# Patient Record
Sex: Female | Born: 1970 | Race: Black or African American | Hispanic: No | Marital: Single | Smoking: Current every day smoker
Health system: Southern US, Community
[De-identification: ages and names within clinical notes are randomized; demographics above are authoritative.]

## PROBLEM LIST (undated history)

## (undated) ENCOUNTER — Emergency Department (HOSPITAL_COMMUNITY): Discharge status: Against Medical Advice | Payer: Medicaid Other

## (undated) DIAGNOSIS — R011 Cardiac murmur, unspecified: Secondary | ICD-10-CM

## (undated) DIAGNOSIS — J189 Pneumonia, unspecified organism: Secondary | ICD-10-CM

## (undated) DIAGNOSIS — R519 Headache, unspecified: Secondary | ICD-10-CM

## (undated) DIAGNOSIS — R51 Headache: Secondary | ICD-10-CM

## (undated) DIAGNOSIS — K56600 Partial intestinal obstruction, unspecified as to cause: Secondary | ICD-10-CM

## (undated) DIAGNOSIS — F319 Bipolar disorder, unspecified: Secondary | ICD-10-CM

## (undated) DIAGNOSIS — N289 Disorder of kidney and ureter, unspecified: Secondary | ICD-10-CM

## (undated) DIAGNOSIS — L02211 Cutaneous abscess of abdominal wall: Secondary | ICD-10-CM

## (undated) DIAGNOSIS — J449 Chronic obstructive pulmonary disease, unspecified: Secondary | ICD-10-CM

## (undated) DIAGNOSIS — D649 Anemia, unspecified: Secondary | ICD-10-CM

## (undated) DIAGNOSIS — J45909 Unspecified asthma, uncomplicated: Secondary | ICD-10-CM

## (undated) DIAGNOSIS — K255 Chronic or unspecified gastric ulcer with perforation: Secondary | ICD-10-CM

## (undated) HISTORY — PX: HERNIA REPAIR: SHX51

## (undated) HISTORY — PX: OTHER SURGICAL HISTORY: SHX169

## (undated) HISTORY — PX: TUBAL LIGATION: SHX77

## (undated) HISTORY — PX: APPENDECTOMY: SHX54

---

## 2012-05-21 ENCOUNTER — Encounter (HOSPITAL_COMMUNITY): Payer: Self-pay | Admitting: Emergency Medicine

## 2012-05-21 ENCOUNTER — Emergency Department (HOSPITAL_COMMUNITY): Payer: Medicaid Other

## 2012-05-21 ENCOUNTER — Emergency Department (HOSPITAL_COMMUNITY)
Admission: EM | Admit: 2012-05-21 | Discharge: 2012-05-21 | Disposition: A | Discharge status: Home / Self Care | Payer: Medicaid Other | Attending: Emergency Medicine | Admitting: Emergency Medicine

## 2012-05-21 DIAGNOSIS — J45901 Unspecified asthma with (acute) exacerbation: Secondary | ICD-10-CM | POA: Insufficient documentation

## 2012-05-21 DIAGNOSIS — J3489 Other specified disorders of nose and nasal sinuses: Secondary | ICD-10-CM | POA: Insufficient documentation

## 2012-05-21 DIAGNOSIS — R0982 Postnasal drip: Secondary | ICD-10-CM | POA: Insufficient documentation

## 2012-05-21 DIAGNOSIS — R062 Wheezing: Secondary | ICD-10-CM

## 2012-05-21 DIAGNOSIS — R05 Cough: Secondary | ICD-10-CM

## 2012-05-21 DIAGNOSIS — F411 Generalized anxiety disorder: Secondary | ICD-10-CM | POA: Insufficient documentation

## 2012-05-21 MED ORDER — LORAZEPAM 1 MG PO TABS
ORAL_TABLET | ORAL | Status: AC
  Administered 2012-05-21: 1 mg
  Filled 2012-05-21: qty 1

## 2012-05-21 MED ORDER — ALBUTEROL SULFATE (5 MG/ML) 0.5% IN NEBU: 5.0000 mg | INHALATION_SOLUTION | Freq: Once | RESPIRATORY_TRACT | Status: AC

## 2012-05-21 MED ORDER — LORAZEPAM 2 MG/ML IJ SOLN: 1.0000 mg | Freq: Once | INTRAMUSCULAR | Status: DC

## 2012-05-21 MED ORDER — ALBUTEROL SULFATE HFA 108 (90 BASE) MCG/ACT IN AERS: 2.0000 | INHALATION_SPRAY | RESPIRATORY_TRACT | Status: DC | PRN

## 2012-05-21 MED ORDER — ALBUTEROL SULFATE (5 MG/ML) 0.5% IN NEBU
5.0000 mg | INHALATION_SOLUTION | Freq: Once | RESPIRATORY_TRACT | Status: AC
  Administered 2012-05-21: 5 mg via RESPIRATORY_TRACT
  Filled 2012-05-21: qty 1

## 2012-05-21 NOTE — ED Provider Notes (Addendum)
History     CSN: [id number]  Arrival date & time 05/21/12  4098   First MD Initiated Contact with Patient 05/21/12 [phone]      Chief Complaint  Patient presents with  . Asthma  . coughing   . Recurrent Sinusitis    (Consider location/radiation/quality/duration/timing/severity/associated sxs/prior treatment) Patient is a 41 y.o. female presenting with asthma. The history is provided by the patient.  Asthma Pertinent negatives include no chest pain, no abdominal pain, no headaches and no shortness of breath.  pt with ?hx asthma, recurrent coughing spells, states out of inhaler and has had non productive cough in past 1-2 days. States feels congested in nose, post nasal drip. No sore throat. No headache or body aches. No known ill contacts. No fever or chills. No night sweats. w asthma, denies prior admission for same. Hx smoking. No chest pain. No swelling. No orthopnea or pnd.     History reviewed. No pertinent past medical history.  History reviewed. No pertinent past surgical history.  History reviewed. No pertinent family history.  History  Substance Use Topics  . Smoking status: Not on file  . Smokeless tobacco: Not on file  . Alcohol Use: Not on file    OB History    Grav Para Term Preterm Abortions TAB SAB Ect Mult Living                  Review of Systems  Constitutional: Negative for fever and chills.  HENT: Negative for neck pain.   Eyes: Negative for redness.  Respiratory: Positive for cough. Negative for shortness of breath.   Cardiovascular: Negative for chest pain, palpitations and leg swelling.  Gastrointestinal: Negative for abdominal pain.  Genitourinary: Negative for flank pain.  Musculoskeletal: Negative for back pain.  Skin: Negative for rash.  Neurological: Negative for headaches.  Hematological: Does not bruise/bleed easily.  Psychiatric/Behavioral:       +anxiety    Allergies  Sulfa antibiotics  Home Medications  No current outpatient  prescriptions on file.  BP 175/126  Pulse 96  Resp 32  SpO2 99%  LMP 05/12/2012  Physical Exam  Nursing note and vitals reviewed. Constitutional: She is oriented to person, place, and time. She appears well-developed and well-nourished.       Pt anxious, hyperventilating.   HENT:  Mouth/Throat: Oropharynx is clear and moist.  Eyes: Conjunctivae normal are normal. Pupils are equal, round, and reactive to light. No scleral icterus.  Neck: Neck supple. No JVD present. No tracheal deviation present.  Cardiovascular: Normal rate, regular rhythm, normal heart sounds and intact distal pulses.   Pulmonary/Chest: Effort normal. No respiratory distress.       Non productive cough. ?mild wheezing. Good air exchange.   Abdominal: Soft. Normal appearance. She exhibits no distension. There is no tenderness.  Musculoskeletal: She exhibits no edema and no tenderness.  Neurological: She is alert and oriented to person, place, and time.  Skin: Skin is warm and dry. No rash noted. She is not diaphoretic.  Psychiatric:       Anxious.     ED Course  Procedures (including critical care time)  Dg Chest 2 View  05/21/2012  *RADIOLOGY REPORT*  Clinical Data: Cough and congestion.  CHEST - 2 VIEW  Comparison: None.  Findings: Two views of the chest demonstrate clear lungs. Heart and mediastinum are within normal limits.  Trachea is midline.  Bony thorax is intact.  IMPRESSION: No acute cardiopulmonary disease.   Original Report Authenticated By: Madelaine Bhat  Henn, M.D.       MDM  Pt very anxious, hyperventilating, non productive cough.  Pt reassured. Is able to slow breathing. Given ativan for anxiety. Alb neb.  Reviewed nursing notes and prior charts for additional history.  Pt reports very new to area, states similar symptoms previously.   Recheck, no increased wob. No wheezing. Chest cta. cxr neg.   Pt states has p[facility] f/u this Monday at [facility]   Suzi Roots, MD 05/21/12 [phone]

## 2012-05-21 NOTE — ED Notes (Signed)
Patient arrives with a continuously dry cough. Unable to hear any wheezing during auscultation due to the loud dry cough. Patient is using a lot of energy. Moving all over the stretcher/ on the floor. At this time unable to get accurate VS's

## 2012-05-21 NOTE — ED Notes (Signed)
States is out of inhaler, coughing uncontrollably at triage- was not coughing on entrance to ED, states unable to "catch breath, "

## 2012-05-23 ENCOUNTER — Encounter (HOSPITAL_COMMUNITY): Payer: Self-pay | Admitting: *Deleted

## 2012-05-23 ENCOUNTER — Emergency Department (HOSPITAL_COMMUNITY)
Admission: EM | Admit: 2012-05-23 | Discharge: 2012-05-23 | Disposition: A | Discharge status: Home / Self Care | Payer: Medicaid Other | Source: Home / Self Care | Attending: Emergency Medicine | Admitting: Emergency Medicine

## 2012-05-23 DIAGNOSIS — J029 Acute pharyngitis, unspecified: Secondary | ICD-10-CM

## 2012-05-23 LAB — POCT RAPID STREP A: Streptococcus, Group A Screen (Direct): NEGATIVE

## 2012-05-23 MED ORDER — TRAMADOL HCL 50 MG PO TABS: 100.0000 mg | ORAL_TABLET | Freq: Three times a day (TID) | ORAL | Status: DC | PRN

## 2012-05-23 MED ORDER — DIPHENHYD-HYDROCORT-NYSTATIN MT SUSP: 5.0000 mL | Freq: Four times a day (QID) | OROMUCOSAL | Status: DC

## 2012-05-23 MED ORDER — MAGIC MOUTHWASH: 5.0000 mL | Freq: Four times a day (QID) | ORAL | Status: DC

## 2012-05-23 MED ORDER — PREDNISONE 5 MG PO KIT: 1.0000 | PACK | Freq: Every day | ORAL | Status: DC

## 2012-05-23 MED ORDER — AMOXICILLIN 500 MG PO CAPS: 500.0000 mg | ORAL_CAPSULE | Freq: Three times a day (TID) | ORAL | Status: DC

## 2012-05-23 NOTE — ED Provider Notes (Signed)
Chief Complaint  Patient presents with  . Sore Throat    History of Present Illness:   Anita Villarreal is a 41 year old asthmatic who has had a two-day history of soreness of her throat, tongue, and mouth. She denies any ulcerations in her mouth. She has also had nasal congestion with yellow drainage, headache, and a nonproductive cough with some wheezing. She has a history of asthma and was just in the emergency room a couple days ago for a refill on her albuterol. She states she's not having any severe symptoms of asthma right now. She's allergic to sulfa erythromycin. She takes Bentyl for IBS and Depakote and trazodone for bipolar disorder. She denies any fever or chills.  Review of Systems:  Other than as noted above, the patient denies any of the following symptoms. Systemic:  No fever, chills, sweats, fatigue, myalgias, headache, or anorexia. Eye:  No redness, pain or drainage. ENT:  No earache, ear congestion, nasal congestion, sneezing, rhinorrhea, sinus pressure, sinus pain, or post nasal drip. Lungs:  No cough, sputum production, wheezing, shortness of breath, or chest pain. GI:  No abdominal pain, nausea, vomiting, or diarrhea. Skin:  No rash or itching.  PMFSH:  Past medical history, family history, social history, meds, allergies, and nurse's notes were reviewed.  There is no known exposure to strep or mono.  No prior history of step or mono.  The patient denies use of tobacco.  Physical Exam:   Vital signs:  BP 125/79  Pulse 83  Temp 98.1 F (36.7 C) (Oral)  Resp 16  SpO2 99%  LMP 05/12/2012 General:  Alert, in no distress. Eye:  No conjunctival injection or drainage. Lids were normal. ENT:  TMs and canals were normal, without erythema or inflammation.  Nasal mucosa was clear and uncongested, without drainage.  Mucous membranes were moist.  Exam of pharynx was completely normal with no erythema, ulcerations, or exudate.  There were no oral ulcerations or lesions. Neck:  Supple, no  adenopathy, tenderness or mass. Lungs:  No respiratory distress.  Lungs were clear to auscultation, without wheezes, rales or rhonchi.  Breath sounds were clear and equal bilaterally.  Heart:  Regular rhythm, without gallops, murmers or rubs. Skin:  Clear, warm, and dry, without rash or lesions.  Labs:   Results for orders placed during the hospital encounter of 05/23/12  POCT RAPID STREP A ([facility]ARE ONLY)      Component Value Range   Streptococcus, Group A Screen (Direct) NEGATIVE  NEGATIVE    Assessment:  The encounter diagnosis was Pharyngitis.  The patient's degree of pain seems to be out of proportion to the physical findings.  Plan:   1.  The following meds were prescribed:   New Prescriptions   AMOXICILLIN (AMOXIL) 500 MG CAPSULE    Take 1 capsule (500 mg total) by mouth 3 (three) times daily.   DIPHENHYD-HYDROCORT-NYSTATIN SUSP    Use as directed 5 mLs in the mouth or throat 4 (four) times daily.   PREDNISONE 5 MG KIT    Take 1 kit (5 mg total) by mouth daily after breakfast. Prednisone 5 mg 6 day dosepack.  Take as directed.   TRAMADOL (ULTRAM) 50 MG TABLET    Take 2 tablets (100 mg total) by mouth every 8 (eight) hours as needed for pain.   2.  The patient was instructed in symptomatic care including hot saline gargles, throat lozenges, infectious precautions, and need to trade out toothbrush. Handouts were given. 3.  The  patient was told to return if becoming worse in any way, if no better in 3 or 4 days, and given some red flag symptoms that would indicate earlier return.    Reuben Likes, MD 05/23/12 1044

## 2012-05-23 NOTE — ED Notes (Signed)
PT    REPORTS  SYMPTOMS  OF  COUGH   /  CONGESTION  AND  SORETHROAT  X   3  DAYS   SEEN  ER   3  DAYS  AGO  FOR  ASTHMA  SYMPTOMS          Pt  Continues  To  Smoke

## 2013-01-19 ENCOUNTER — Emergency Department (HOSPITAL_COMMUNITY)
Admission: EM | Admit: 2013-01-19 | Discharge: 2013-01-19 | Disposition: A | Discharge status: Home / Self Care | Payer: Medicaid Other | Attending: Emergency Medicine | Admitting: Emergency Medicine

## 2013-01-19 ENCOUNTER — Emergency Department (HOSPITAL_COMMUNITY): Payer: Medicaid Other

## 2013-01-19 ENCOUNTER — Encounter (HOSPITAL_COMMUNITY): Payer: Self-pay

## 2013-01-19 DIAGNOSIS — F319 Bipolar disorder, unspecified: Secondary | ICD-10-CM | POA: Insufficient documentation

## 2013-01-19 DIAGNOSIS — F17200 Nicotine dependence, unspecified, uncomplicated: Secondary | ICD-10-CM | POA: Insufficient documentation

## 2013-01-19 DIAGNOSIS — Z79899 Other long term (current) drug therapy: Secondary | ICD-10-CM | POA: Insufficient documentation

## 2013-01-19 DIAGNOSIS — X58XXXA Exposure to other specified factors, initial encounter: Secondary | ICD-10-CM | POA: Insufficient documentation

## 2013-01-19 DIAGNOSIS — S93409A Sprain of unspecified ligament of unspecified ankle, initial encounter: Secondary | ICD-10-CM | POA: Insufficient documentation

## 2013-01-19 DIAGNOSIS — Y929 Unspecified place or not applicable: Secondary | ICD-10-CM | POA: Insufficient documentation

## 2013-01-19 DIAGNOSIS — Y939 Activity, unspecified: Secondary | ICD-10-CM | POA: Insufficient documentation

## 2013-01-19 HISTORY — DX: Bipolar disorder, unspecified: F31.9

## 2013-01-19 MED ORDER — HYDROCODONE-ACETAMINOPHEN 5-325 MG PO TABS: ORAL_TABLET | ORAL | Status: DC

## 2013-01-19 MED ORDER — HYDROCODONE-ACETAMINOPHEN 5-325 MG PO TABS
2.0000 | ORAL_TABLET | Freq: Once | ORAL | Status: AC
  Administered 2013-01-19: 2 via ORAL
  Filled 2013-01-19: qty 2

## 2013-01-19 NOTE — ED Notes (Signed)
Pt has been seen by PCP for left ankle swelling. Has been referred to Rheumatology but has not yet seen. Here today for c/o severe left ankle pain. Pt is tearful and anxious.

## 2013-01-19 NOTE — ED Notes (Signed)
Pt comfortable with d/c and f/u instructions. 

## 2013-01-19 NOTE — ED Provider Notes (Signed)
History    This chart was scribed for non-physician practitioner working with No att. providers found by Anita Villarreal, ED Scribe. This patient was seen in room TR04C/TR04C and the patient's care was started at 1340.  CSN: [id number] Arrival date & time 01/19/13  1340  First MD Initiated Contact with Patient 01/19/13 1429     Chief Complaint  Patient presents with  . Ankle Pain    The history is provided by the patient. No language interpreter was used.    HPI Comments: Anita Villarreal is a 42 y.o. female who presents to the Emergency Department complaining of ongoing, constant, gradually worsening L ankle pain starting about 3 weeks ago. Pt reports she sprained her R ankle which has caused her to begin having pain in her L ankle. She states the pain has worsened significantly today. She denies any injury or trauma to the L ankle. Pt was prescribed hydrocodone from her orthopedist, Dr. Penni Villarreal at [facility] pain medication for her R ankle that lasted about 10 days. He is also been on a prednisone taper which she completed yesterday. She elevates the affected ankle every night and has tried ace wraps as well. She denies weakness or numbness in lower extremities.    Past Medical History  Diagnosis Date  . Bipolar 1 disorder   . Manic depressive disorder    Past Surgical History  Procedure Laterality Date  . Cholecystectomy    . Appendectomy     No family history on file. History  Substance Use Topics  . Smoking status: Current Every Day Smoker  . Smokeless tobacco: Not on file  . Alcohol Use: Yes   OB History   Grav Para Term Preterm Abortions TAB SAB Ect Mult Living                 Review of Systems  Constitutional: Negative for fever.  Respiratory: Negative for shortness of breath.   Cardiovascular: Negative for chest pain.  Gastrointestinal: Negative for nausea, vomiting, abdominal pain and diarrhea.  Musculoskeletal: Positive for joint swelling and  arthralgias (L ankle pain).  Neurological: Negative for weakness and numbness.  All other systems reviewed and are negative.    Allergies  Erythromycin and Sulfa antibiotics  Home Medications   Current Outpatient Rx  Name  Route  Sig  Dispense  Refill  . albuterol (PROVENTIL HFA;VENTOLIN HFA) 108 (90 BASE) MCG/ACT inhaler   Inhalation   Inhale 2 puffs into the lungs every 6 (six) hours as needed. Wheezing         . albuterol (PROVENTIL) (2.5 MG/3ML) 0.083% nebulizer solution   Nebulization   Take 2.5 mg by nebulization every 6 (six) hours as needed for wheezing.         . budesonide-formoterol (SYMBICORT) 80-4.5 MCG/ACT inhaler   Inhalation   Inhale 2 puffs into the lungs 2 (two) times daily.         Marland Kitchen buPROPion (WELLBUTRIN XL) 150 MG 24 hr tablet   Oral   Take 150 mg by mouth every morning.         . dicyclomine (BENTYL) 20 MG tablet   Oral   Take 20 mg by mouth 4 (four) times daily.         Marland Kitchen gabapentin (NEURONTIN) 100 MG capsule   Oral   Take 100 mg by mouth 2 (two) times daily.         . risperiDONE (RISPERDAL) 2 MG tablet   Oral   Take 2  mg by mouth at bedtime.          BP 107/78  Pulse 98  Temp(Src) 98.7 F (37.1 C) (Oral)  Resp 20  SpO2 99%  LMP 01/13/2013 Physical Exam  Nursing note and vitals reviewed. Constitutional: She is oriented to person, place, and time. She appears well-developed and well-nourished. No distress.  HENT:  Head: Normocephalic and atraumatic.  Eyes: EOM are normal.  Neck: Neck supple. No tracheal deviation present.  Cardiovascular: Normal rate.   Pulmonary/Chest: Effort normal. No respiratory distress.  Musculoskeletal: Normal range of motion.  Ankle with trace swelling. Dorsalis pedis 2+. Excellent range of motion to ankle and toes. Diffusely tender to palpation along the dorsum of the ankle. Distal sensation is grossly intact.  Neurological: She is alert and oriented to person, place, and time.  Skin: Skin is  warm and dry.  Psychiatric: She has a normal mood and affect. Her behavior is normal.    ED Course  Procedures (including critical care time)  DIAGNOSTIC STUDIES: Oxygen Saturation is 99% on RA, normal by my interpretation.    COORDINATION OF CARE: 3:19 PM Discussed treatment plan with pt at bedside and pt agreed to plan.   Labs Reviewed - No data to display Dg Ankle Complete Left  01/19/2013   *RADIOLOGY REPORT*  Clinical Data: Left ankle injury approximately 2 months ago, persistent pain and swelling.  LEFT ANKLE COMPLETE - 3+ VIEW  Comparison: None.  Findings: No evidence of acute or subacute fracture or dislocation. Ankle mortise intact with well-preserved joint space.  No intrinsic osseous abnormalities.  No evidence of a significant joint effusion.  IMPRESSION: Normal examination.   Original Report Authenticated By: Anita Villarreal, M.D.   1. Ankle sprain and strain, left, initial encounter     MDM   Filed Vitals:   01/19/13 1407  BP: 107/78  Pulse: 98  Temp: 98.7 F (37.1 C)  TempSrc: Oral  Resp: 20  SpO2: 99%     [name] is a 42 y.o. female with atraumatic left ankle swelling and pain likely secondary to increased pressure from right ankle prior sprain. Triage initiated x-ray is negative.  Medications  HYDROcodone-acetaminophen (NORCO/VICODIN) 5-325 MG per tablet 2 tablet (not administered)    The patient is hemodynamically stable, appropriate for, and amenable to, discharge at this time. Pt verbalized understanding and agrees with care plan. Outpatient follow-up and return precautions given.    New Prescriptions   HYDROCODONE-ACETAMINOPHEN (NORCO/VICODIN) 5-325 MG PER TABLET    Take 1-2 tablets by mouth every 6 hours as needed for pain.    I personally performed the services described in this documentation, which was scribed in my presence. The recorded information has been reviewed and is accurate.   Anita Emery, PA-C 01/19/13 1525

## 2013-01-19 NOTE — ED Notes (Signed)
Pt. Has a rt. Ankle sprain and she has been using her lt. Ankle and now she is having pain.  Denies any injuries.   Pt. Is very tearful .  She is manic depressive and bipolar  And she has  A problem being around lots of people.

## 2013-01-19 NOTE — ED Provider Notes (Signed)
Medical screening examination/treatment/procedure(s) were performed by non-physician practitioner and as supervising physician I was immediately available for consultation/collaboration.   Ashby Dawes, MD 01/19/13 [phone]

## 2013-06-13 ENCOUNTER — Ambulatory Visit: Payer: Medicaid Other | Attending: Internal Medicine | Admitting: Physical Therapy

## 2013-06-13 DIAGNOSIS — M6281 Muscle weakness (generalized): Secondary | ICD-10-CM | POA: Insufficient documentation

## 2013-06-13 DIAGNOSIS — R262 Difficulty in walking, not elsewhere classified: Secondary | ICD-10-CM | POA: Insufficient documentation

## 2013-06-13 DIAGNOSIS — IMO0001 Reserved for inherently not codable concepts without codable children: Secondary | ICD-10-CM | POA: Insufficient documentation

## 2013-06-13 DIAGNOSIS — M25659 Stiffness of unspecified hip, not elsewhere classified: Secondary | ICD-10-CM | POA: Insufficient documentation

## 2013-06-13 DIAGNOSIS — M25559 Pain in unspecified hip: Secondary | ICD-10-CM | POA: Insufficient documentation

## 2013-06-14 ENCOUNTER — Ambulatory Visit: Payer: Medicaid Other

## 2014-01-22 ENCOUNTER — Ambulatory Visit: Payer: Medicaid Other | Attending: Orthopedic Surgery | Admitting: Physical Therapy

## 2014-01-22 DIAGNOSIS — R262 Difficulty in walking, not elsewhere classified: Secondary | ICD-10-CM | POA: Diagnosis present

## 2014-01-22 DIAGNOSIS — M25559 Pain in unspecified hip: Secondary | ICD-10-CM | POA: Insufficient documentation

## 2014-01-22 DIAGNOSIS — M25669 Stiffness of unspecified knee, not elsewhere classified: Secondary | ICD-10-CM | POA: Diagnosis not present

## 2014-02-05 ENCOUNTER — Ambulatory Visit: Payer: Medicaid Other | Attending: Orthopedic Surgery | Admitting: Physical Therapy

## 2014-02-05 DIAGNOSIS — R262 Difficulty in walking, not elsewhere classified: Secondary | ICD-10-CM | POA: Insufficient documentation

## 2014-02-05 DIAGNOSIS — M25559 Pain in unspecified hip: Secondary | ICD-10-CM | POA: Diagnosis not present

## 2014-02-05 DIAGNOSIS — M25669 Stiffness of unspecified knee, not elsewhere classified: Secondary | ICD-10-CM | POA: Insufficient documentation

## 2014-03-14 ENCOUNTER — Ambulatory Visit: Payer: Medicaid Other | Attending: Orthopedic Surgery | Admitting: Physical Therapy

## 2014-03-14 DIAGNOSIS — M25669 Stiffness of unspecified knee, not elsewhere classified: Secondary | ICD-10-CM | POA: Diagnosis not present

## 2014-03-14 DIAGNOSIS — M25559 Pain in unspecified hip: Secondary | ICD-10-CM | POA: Diagnosis not present

## 2014-03-14 DIAGNOSIS — R262 Difficulty in walking, not elsewhere classified: Secondary | ICD-10-CM | POA: Insufficient documentation

## 2014-07-06 DIAGNOSIS — J189 Pneumonia, unspecified organism: Secondary | ICD-10-CM

## 2014-07-06 HISTORY — DX: Pneumonia, unspecified organism: J18.9

## 2014-11-27 ENCOUNTER — Ambulatory Visit: Payer: Medicaid Other | Admitting: Physical Therapy

## 2014-12-11 ENCOUNTER — Ambulatory Visit: Payer: Medicaid Other | Attending: Orthopedic Surgery | Admitting: Physical Therapy

## 2014-12-11 DIAGNOSIS — M25551 Pain in right hip: Secondary | ICD-10-CM | POA: Diagnosis present

## 2014-12-11 DIAGNOSIS — R269 Unspecified abnormalities of gait and mobility: Secondary | ICD-10-CM | POA: Diagnosis not present

## 2014-12-11 DIAGNOSIS — R29898 Other symptoms and signs involving the musculoskeletal system: Secondary | ICD-10-CM | POA: Insufficient documentation

## 2014-12-11 DIAGNOSIS — M24651 Ankylosis, right hip: Secondary | ICD-10-CM

## 2014-12-11 NOTE — Patient Instructions (Signed)
   Codee Bloodworth PT, DPT, LAT, ATC  [facility] Phone: [phone]     

## 2014-12-11 NOTE — Therapy (Addendum)
[facility] [address] [place], [place], [address] Phone: [phone]   Fax:  [phone]  Physical Therapy Evaluation  Patient Details  Name: Anita Villarreal MRN: [id number] Date of Birth: 09-12-1970 Referring Provider:  Clarita Leber, *  Encounter Date: 12/11/2014      PT End of Session - 12/11/14 1242    Visit Number 1   Number of Visits 4   Date for PT Re-Evaluation 01/22/15   PT Start Time 1100   PT Stop Time 1145   PT Time Calculation (min) 45 min   Activity Tolerance Patient tolerated treatment well   Behavior During Therapy [facility] for tasks assessed/performed      Past Medical History  Diagnosis Date  . Bipolar 1 disorder   . Manic depressive disorder     Past Surgical History  Procedure Laterality Date  . Cholecystectomy    . Appendectomy      There were no vitals filed for this visit.  Visit Diagnosis:  Right hip pain - Plan: PT plan of care cert/re-cert  Weakness of right hip - Plan: PT plan of care cert/re-cert  Abnormality of gait - Plan: PT plan of care cert/re-cert  Decreased range of hip movement, right - Plan: PT plan of care cert/re-cert      Subjective Assessment - 12/11/14 1108    Subjective pt is a 44 y.o F s/p R hip arthroscopy 11/22/2014.  She reports that she continues to use CPM at home and wear the TENS unit all. she report symptoms have gotten working.    How long can you sit comfortably? unlimited   How long can you stand comfortably? 10 min   How long can you walk comfortably? 10 min   Diagnostic tests MRI 2015  arthrtic changes in the R hip per pt report   Patient Stated Goals to make the leg better, to get off the crutches, decrease pain, to get back to walking.    Currently in Pain? Yes   Pain Score 4    Pain Location Hip   Pain Orientation Right   Pain Descriptors / Indicators Sharp   Pain Type Surgical pain   Pain Onset 1 to 4 weeks ago   Pain Frequency  Intermittent   Aggravating Factors  walking, standing, getting into and out of car, steps,    Pain Relieving Factors Tens, CPM, hot shower, ice   Effect of Pain on Daily Activities limited endurance with weight bearing activities            [facility] PT Assessment - 12/11/14 1116    Assessment   Medical Diagnosis R hip pain s/p arthroscopy    Onset Date/Surgical Date 11/22/14   Hand Dominance Right   Next MD Visit 11/08/2014   Prior Therapy yes  left hp   Precautions   Precautions Other (comment)   Required Braces or Orthoses Other Brace/Splint   Other Brace/Splint crutches unitl   01/03/2015   Restrictions   Weight Bearing Restrictions No   Balance Screen   Has the patient fallen in the past 6 months No   Has the patient had a decrease in activity level because of a fear of falling?  No   Is the patient reluctant to leave their home because of a fear of falling?  No   Home Environment   Living Environment Private residence   Living Arrangements Children;Spouse/significant other   Available Help at Discharge Available 24 hours/day;Available PRN/intermittently   Type  of Home House   Home Access Stairs to enter   Entrance Stairs-Number of Steps 3   Entrance Stairs-Rails Can reach both   Home Layout One level   Home Equipment Crutches  hip brace   Prior Function   Level of Independence Independent;Independent with basic ADLs;Independent with household mobility without device;Independent with community mobility without device;Independent with gait;Independent with transfers   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   Vocation Requirements lifting, carrying, cleaning,    Leisure walking   Cognition   Overall Cognitive Status Within Functional Limits for tasks assessed   Observation/Other Assessments   Lower Extremity Functional Scale  34/80   Posture/Postural Control   Posture/Postural Control Postural limitations   Postural Limitations Rounded Shoulders;Forward head    ROM / Strength   AROM / PROM / Strength AROM;Strength;PROM   AROM   AROM Assessment Site Hip   Right/Left Hip Right;Left   Right Hip Extension 10   Right Hip Flexion 104  with pain during movement   Right Hip ABduction 10  pain during movement   Right Hip ADduction 15   Left Hip Extension 10   Left Hip Flexion 100   Left Hip ABduction 36   Left Hip ADduction 15   PROM   Overall PROM  Within functional limits for tasks performed   PROM Assessment Site Hip   Right/Left Hip Right;Left   Strength   Strength Assessment Site Hip   Right/Left Hip Right;Left   Right Hip Flexion 4/5   Right Hip Extension 3-/5   Right Hip ABduction 4/5   Right Hip ADduction 4-/5   Left Hip Flexion 4+/5   Left Hip Extension 3-/5   Left Hip ABduction 5/5   Left Hip ADduction 5/5   Palpation   Palpation comment tenderness located in the groin and at the ant joint line   Ambulation/Gait   Ambulation/Gait Yes   Gait Pattern Step-through pattern;Decreased step length - left;Decreased stance time - right;Antalgic  with use of crutches   Static Standing Balance   Static Standing Balance -  Activities  Single Leg Stance - Right Leg;Single Leg Stance - Left Leg  4 sec on R, 10 sec on Left                   [facility] Adult PT Treatment/Exercise - 12/11/14 1116    Knee/Hip Exercises: Standing   Heel Raises 1 set;10 reps   Other Standing Knee Exercises sit to stand x 10  VC to control eccentric conctraction   Other Standing Knee Exercises standing hip extension/abduction x 10   Knee/Hip Exercises: Supine   Straight Leg Raises AROM;Strengthening;Right;1 set;10 reps  with quad set                PT Education - 12/11/14 1241    Education provided Yes   Education Details evaluation findings, POC, HEP, Goals   Person(s) Educated Patient   Methods Explanation   Comprehension Verbalized understanding          PT Short Term Goals - 12/11/14 1249    PT SHORT TERM GOAL #1   Title pt  will Be I with basic HEP (01/01/2015)   Baseline HHPT HEP only    Time 3   Period Weeks   Status New           PT Long Term Goals - 12/11/14 1250    PT LONG TERM GOAL #1   Title She will Be I  with advanced HEP from final visit upon discharge (01/22/2015)   Baseline HHPT HEP only    Time 6   Period Weeks   Status New   PT LONG TERM GOAL #2   Title She will increase hip AROM by > 15 degrees to assist with functional and efficient gait to assist with walking without AD (01/22/2015)   Baseline hip flexion 104, ext 10, abd 10, add 15   Time 6   Period Weeks   Status New   PT LONG TERM GOAL #3   Title she will increase bil hip strength to > 4+/5 to assist with safety during ambulation (01/22/2015)   Baseline MMT hip flexion 4/5, ext 3-/5, abd 4/5, add 4-/5   Time 6   Period Weeks   Status New   PT LONG TERM GOAL #4   Title She will be able to tolerate standing/walking for > 30-45 min to help with funcitonal progression and ADLs (01/22/2015)   Baseline able to stand or walk for 10 min   Time 6   Status New   PT LONG TERM GOAL #5   Title she will be able to verbalize and demonsrate techniques to reduce risk or R hip reinjury via postural awareness, lifting and carrying mechanics, and HEP (01/22/2015)   Baseline no postural awareness, or knowledge of lifting mechanics   Time 6   Period Weeks   Status New               Plan - 12/11/14 1242    Clinical Impression Statement Anita Villarreal presents to OPPT with CC of R hip pain following athroscopic surgery on 11/22/2014. she is only able to hold Single leg balance for 4 sec on the R and 10 sec on the L with min/mod postural sway. she demonstrates limited AROM of the R hip compared bil due to pain, [facility]. palpation reveals tenerness located a tthe anterior hip joint line/groin. She demonstrates weakness of R hip compared bil with signficiant weakness noted in bil glutes rated at 3-5. She currently amnulates with crutches with an  antalgic gait pattner exhibiting limited step length on the LLE and decreased stance on the RLE. She would benefit from skilled physical therapy to maximize her function by addressing the impairments listed.   CPT [id number]   Pt will benefit from skilled therapeutic intervention in order to improve on the following deficits Abnormal gait;Decreased activity tolerance;Decreased balance;Decreased knowledge of precautions;Hypomobility;Decreased strength;Decreased mobility;Pain;Decreased endurance;Decreased range of motion;Difficulty walking;Improper body mechanics;Postural dysfunction   Rehab Potential Good   PT Frequency --  1 x every other week   PT Duration 6 weeks   PT Treatment/Interventions ADLs/Self Care Home Management;Cryotherapy;Radiation protection practitionerlectrical Stimulation;Moist Heat;Ultrasound;Gait training;Stair training;Therapeutic activities;Therapeutic exercise;Balance training;Neuromuscular re-education;Patient/family education;Manual techniques;Dry needling;Passive range of motion;Taping   PT Next Visit Plan assess respone to HEP, hip mobility and strengthening, balance training.    PT Home Exercise Plan see HEP handout   Consulted and Agree with Plan of Care Patient         Problem List There are no active problems to display for this patient.  Lulu RidingKristoffer Tessi Eustache PT, DPT, LAT, ATC  12/11/2014  4:02 PM   [facility] [address] [place], [place], [id number] Phone: [phone]   Fax:  [phone]

## 2014-12-24 ENCOUNTER — Other Ambulatory Visit: Payer: Self-pay | Admitting: Physician Assistant

## 2014-12-24 DIAGNOSIS — Z1231 Encounter for screening mammogram for malignant neoplasm of breast: Secondary | ICD-10-CM

## 2014-12-27 ENCOUNTER — Ambulatory Visit: Payer: Medicaid Other | Admitting: Physical Therapy

## 2014-12-27 DIAGNOSIS — R269 Unspecified abnormalities of gait and mobility: Secondary | ICD-10-CM

## 2014-12-27 DIAGNOSIS — M25551 Pain in right hip: Secondary | ICD-10-CM

## 2014-12-27 DIAGNOSIS — R29898 Other symptoms and signs involving the musculoskeletal system: Secondary | ICD-10-CM

## 2014-12-27 DIAGNOSIS — M24651 Ankylosis, right hip: Secondary | ICD-10-CM

## 2014-12-27 NOTE — Patient Instructions (Addendum)
   Tangee Marszalek PT, DPT, LAT, ATC  [facility] Phone: [phone]     

## 2014-12-27 NOTE — Therapy (Signed)
[facility] [address] [place], [place], [address] Phone: [phone]   Fax:  [phone]  Physical Therapy Treatment  Patient Details  Name: Anita Villarreal MRN: [id number] Date of Birth: 08-21-1970 Referring Provider:  Shelia Media, MD  Encounter Date: 12/27/2014      PT End of Session - 12/27/14 1113    Visit Number 2   Number of Visits 4   Date for PT Re-Evaluation 01/22/15   PT Start Time 1100   PT Stop Time 1145   PT Time Calculation (min) 45 min   Activity Tolerance Patient tolerated treatment well   Behavior During Therapy [facility] for tasks assessed/performed      Past Medical History  Diagnosis Date  . Bipolar 1 disorder   . Manic depressive disorder     Past Surgical History  Procedure Laterality Date  . Cholecystectomy    . Appendectomy      There were no vitals filed for this visit.  Visit Diagnosis:  Right hip pain  Weakness of right hip  Abnormality of gait  Decreased range of hip movement, right      Subjective Assessment - 12/27/14 1109    Subjective "I had to get rid of the crutches, the dr wanted me to keep on them but I told him it was safer if I didn't use them because I have coordination trouble"     Currently in Pain? Yes   Pain Score 1    Pain Location Hip   Pain Orientation Right   Pain Descriptors / Indicators Sore   Pain Type Surgical pain   Pain Onset More than a month ago   Pain Frequency Intermittent   Aggravating Factors  abducting the hip   Pain Relieving Factors TENs,             [facility] PT Assessment - 12/27/14 1116    AROM   Right Hip Extension 14   Right Hip Flexion 118   Right Hip ABduction 15   Right Hip ADduction 15                     [facility] Adult PT Treatment/Exercise - 12/27/14 1111    Knee/Hip Exercises: Stretches   Active Hamstring Stretch 2 reps;30 seconds   Other Knee/Hip Stretches single knee to chest stretch  2 x 30 sec   Knee/Hip Exercises: Aerobic   Stationary Bike Nu Step L5 x 8 min   Knee/Hip Exercises: Machines for Strengthening   Hip Cybex flexion/ extension/ abduction x 10 ea  12.5#   Knee/Hip Exercises: Standing   Heel Raises 1 set;20 reps   Wall Squat 1 set;15 reps   Other Standing Knee Exercises sit to stand 2 x 10  from lower table   Knee/Hip Exercises: Supine   Bridges AROM;Strengthening;Both;2 sets;10 reps  with sustained hip abduction with green theraband                PT Education - 12/27/14 1311    Education provided Yes   Education Details added hip strengthening to HEP   Person(s) Educated Patient   Methods Explanation   Comprehension Verbalized understanding          PT Short Term Goals - 12/27/14 1314    PT SHORT TERM GOAL #1   Title pt will Be I with basic HEP (01/01/2015)   Baseline HHPT HEP only    Time 3   Period Weeks   Status Achieved  PT Long Term Goals - 12/27/14 1315    PT LONG TERM GOAL #1   Title She will Be I with advanced HEP from final visit upon discharge (01/22/2015)   Baseline HHPT HEP only    Time 6   Period Weeks   Status On-going   PT LONG TERM GOAL #2   Title She will increase hip AROM by > 15 degrees to assist with functional and efficient gait to assist with walking without AD (01/22/2015)   Baseline hip flexion 104, ext 10, abd 10, add 15   Time 6   Period Weeks   Status On-going   PT LONG TERM GOAL #3   Title she will increase bil hip strength to > 4+/5 to assist with safety during ambulation (01/22/2015)   Baseline MMT hip flexion 4/5, ext 3-/5, abd 4/5, add 4-/5   Time 6   Period Weeks   Status On-going   PT LONG TERM GOAL #4   Title She will be able to tolerate standing/walking for > 30-45 min to help with funcitonal progression and ADLs (01/22/2015)   Baseline able to stand or walk for 10 min   Time 6   Status New   PT LONG TERM GOAL #5   Title she will be able to verbalize and demonsrate techniques to reduce  risk or R hip reinjury via postural awareness, lifting and carrying mechanics, and HEP (01/22/2015)   Baseline no postural awareness, or knowledge of lifting mechanics   Time 6   Period Weeks   Status On-going               Plan - 12/27/14 1312    Clinical Impression Statement Makalya presents to therapy today with report that she is making improvements. She has demonstrate increase in hip mobility since the last visit. She tolerated all exercises well with no increase in pain or symptoms with the only complaint being increased fatigue. added hip strengthening to HEP.    PT Next Visit Plan  hip mobility and strengthening, balance training.    PT Home Exercise Plan clams with bridge, wall squats, and SLR    Consulted and Agree with Plan of Care Patient        Problem List There are no active problems to display for this patient.  Lulu Riding PT, DPT, LAT, ATC  12/27/2014  1:18 PM    [facility] [address] [place], [place], [address] Phone: [phone]   Fax:  [phone]

## 2015-01-01 ENCOUNTER — Inpatient Hospital Stay: Admission: RE | Admit: 2015-01-01 | Payer: Medicaid Other | Source: Ambulatory Visit

## 2015-01-10 ENCOUNTER — Ambulatory Visit: Payer: Medicaid Other | Admitting: Physical Therapy

## 2015-01-24 ENCOUNTER — Ambulatory Visit: Payer: Medicaid Other | Attending: Orthopedic Surgery | Admitting: Physical Therapy

## 2015-01-24 DIAGNOSIS — R269 Unspecified abnormalities of gait and mobility: Secondary | ICD-10-CM | POA: Insufficient documentation

## 2015-01-24 DIAGNOSIS — M25551 Pain in right hip: Secondary | ICD-10-CM | POA: Diagnosis present

## 2015-01-24 DIAGNOSIS — M24651 Ankylosis, right hip: Secondary | ICD-10-CM | POA: Diagnosis present

## 2015-01-24 DIAGNOSIS — R29898 Other symptoms and signs involving the musculoskeletal system: Secondary | ICD-10-CM | POA: Insufficient documentation

## 2015-01-24 NOTE — Therapy (Signed)
[place], [place], [address] Phone: [phone]   Fax:  [phone]  Physical Therapy Treatment  Patient Details  Name: Anita Villarreal MRN: [id number] Date of Birth: [birth year] Referring Provider:  Jackolyn Confer, MD  Encounter Date: 01/24/2015      PT End of Session - 01/24/15 1005    Visit Number 3   Number of Visits 4   Date for PT Re-Evaluation 01/22/15   PT Start Time 0930   PT Stop Time 1000   PT Time Calculation (min) 30 min   Activity Tolerance Patient tolerated treatment well   Behavior During Therapy [facility] for tasks assessed/performed      Past Medical History  Diagnosis Date  . Bipolar 1 disorder   . Manic depressive disorder     Past Surgical History  Procedure Laterality Date  . Cholecystectomy    . Appendectomy      There were no vitals filed for this visit.  Visit Diagnosis:  Right hip pain  Weakness of right hip  Abnormality of gait  Decreased range of hip movement, right      Subjective Assessment - 01/24/15 0934    Subjective "I haven't had any pain for a while in the hip, and have been try to walk more. My son put the poke'mon go app on my phone so I have been trying to exercise doing walking"   Currently in Pain? No/denies   Pain Score 0-No pain   Pain Location Hip   Pain Orientation Right            [facility] PT Assessment - 01/24/15 0936    Observation/Other Assessments   Lower Extremity Functional Scale  57/80   AROM   Right Hip Extension 20   Right Hip Flexion 120   Right Hip ABduction 20   Right Hip ADduction 18   Strength   Right Hip Flexion 4+/5   Right Hip Extension 4-/5   Right Hip ABduction 4+/5   Right Hip ADduction 5/5                     [facility] Adult PT Treatment/Exercise - 01/24/15 0001    Self-Care   Self-Care ADL's   ADL's how to maintain good posture while doing ADL's to avoid injury or problems in the low  back/hips.                 PT Education - 01/24/15 1005    Education provided Yes   Education Details HEP review   Person(s) Educated Patient   Methods Explanation   Comprehension Verbalized understanding          PT Short Term Goals - 01/24/15 0945    PT SHORT TERM GOAL #1   Title pt will Be I with basic HEP (01/01/2015)   Baseline HHPT HEP only    Time 3   Period Weeks   Status Achieved           PT Long Term Goals - 01/24/15 0945    PT LONG TERM GOAL #1   Title She will Be I with advanced HEP from final visit upon discharge (01/22/2015)   Baseline HHPT HEP only    Time 6   Period Weeks   Status Achieved   PT LONG TERM GOAL #2   Baseline hip flexion 104, ext 10, abd 10, add 15   Time 6   Period Weeks   Status Achieved  PT LONG TERM GOAL #3   Title she will increase bil hip strength to > 4+/5 to assist with safety during ambulation (01/22/2015)   Baseline MMT hip flexion 4/5, ext 3-/5, abd 4/5, add 4-/5   Time 6   Period Weeks   Status Partially Met   PT LONG TERM GOAL #4   Title She will be able to tolerate standing/walking for > 30-45 min to help with funcitonal progression and ADLs (01/22/2015)   Baseline able to stand or walk for 10 min   Time 6   Period Weeks   Status Achieved   PT LONG TERM GOAL #5   Title she will be able to verbalize and demonsrate techniques to reduce risk or R hip reinjury via postural awareness, lifting and carrying mechanics, and HEP (01/22/2015)   Baseline no postural awareness, or knowledge of lifting mechanics   Time 6   Period Weeks   Status Achieved               Plan - 01/24/15 1005    Clinical Impression Statement Emeri reports she has been doing good with the R hip for the last couple of weeks. She states that she has been doing more walking activities and has been doing her HEP. She has increased her LEFS score to 57/80 and additionally has met all LTG. she is able to [facility] and progress her current  levle of function and no longer requires skilled physical therapy.    PT Next Visit Plan discharged today   PT Home Exercise Plan HEP review   Consulted and Agree with Plan of Care Patient        Problem List There are no active problems to display for this patient.                       PHYSICAL THERAPY DISCHARGE SUMMARY  Visits from Start of Care: 3  Current functional level related to goals / functional outcomes: LEFS 57/80   Remaining deficits: N/A   Education / Equipment: HEP  Plan: Patient agrees to discharge.  Patient goals were met. Patient is being discharged due to meeting the stated rehab goals.  ?????        Starr Lake PT, DPT, LAT, ATC  01/24/2015  10:09 AM    [facility] [address] [place], [place], [address] Phone: [phone]   Fax:  [phone]

## 2015-01-24 NOTE — Patient Instructions (Signed)

## 2015-02-07 ENCOUNTER — Ambulatory Visit: Payer: Medicaid Other | Admitting: Physical Therapy

## 2015-02-21 ENCOUNTER — Encounter: Payer: Medicaid Other | Admitting: Physical Therapy

## 2015-05-07 HISTORY — PX: CHOLECYSTECTOMY OPEN: SUR202

## 2015-06-04 ENCOUNTER — Emergency Department (HOSPITAL_COMMUNITY): Payer: Medicaid Other

## 2015-06-04 ENCOUNTER — Emergency Department (HOSPITAL_COMMUNITY): Payer: Medicaid Other | Admitting: Certified Registered"

## 2015-06-04 ENCOUNTER — Encounter (HOSPITAL_COMMUNITY): Admission: EM | Disposition: A | Discharge status: Home Health | Payer: Self-pay | Source: Home / Self Care

## 2015-06-04 ENCOUNTER — Inpatient Hospital Stay (HOSPITAL_COMMUNITY)
Admission: EM | Admit: 2015-06-04 | Discharge: 2015-06-15 | DRG: 326 | Disposition: A | Discharge status: Home Health | Payer: Medicaid Other | Attending: Surgery | Admitting: Surgery

## 2015-06-04 ENCOUNTER — Encounter (HOSPITAL_COMMUNITY): Payer: Self-pay | Admitting: Emergency Medicine

## 2015-06-04 DIAGNOSIS — J189 Pneumonia, unspecified organism: Secondary | ICD-10-CM | POA: Diagnosis not present

## 2015-06-04 DIAGNOSIS — F3160 Bipolar disorder, current episode mixed, unspecified: Secondary | ICD-10-CM | POA: Diagnosis present

## 2015-06-04 DIAGNOSIS — K251 Acute gastric ulcer with perforation: Secondary | ICD-10-CM | POA: Diagnosis present

## 2015-06-04 DIAGNOSIS — G4700 Insomnia, unspecified: Secondary | ICD-10-CM | POA: Diagnosis not present

## 2015-06-04 DIAGNOSIS — K651 Peritoneal abscess: Secondary | ICD-10-CM | POA: Diagnosis present

## 2015-06-04 DIAGNOSIS — Z881 Allergy status to other antibiotic agents status: Secondary | ICD-10-CM

## 2015-06-04 DIAGNOSIS — Y95 Nosocomial condition: Secondary | ICD-10-CM | POA: Diagnosis present

## 2015-06-04 DIAGNOSIS — F17200 Nicotine dependence, unspecified, uncomplicated: Secondary | ICD-10-CM | POA: Diagnosis present

## 2015-06-04 DIAGNOSIS — Z7951 Long term (current) use of inhaled steroids: Secondary | ICD-10-CM | POA: Diagnosis not present

## 2015-06-04 DIAGNOSIS — R109 Unspecified abdominal pain: Secondary | ICD-10-CM | POA: Diagnosis present

## 2015-06-04 DIAGNOSIS — K3189 Other diseases of stomach and duodenum: Secondary | ICD-10-CM | POA: Diagnosis present

## 2015-06-04 DIAGNOSIS — Z79899 Other long term (current) drug therapy: Secondary | ICD-10-CM

## 2015-06-04 DIAGNOSIS — Z9889 Other specified postprocedural states: Secondary | ICD-10-CM

## 2015-06-04 DIAGNOSIS — D649 Anemia, unspecified: Secondary | ICD-10-CM | POA: Diagnosis present

## 2015-06-04 DIAGNOSIS — R630 Anorexia: Secondary | ICD-10-CM | POA: Diagnosis present

## 2015-06-04 DIAGNOSIS — R7989 Other specified abnormal findings of blood chemistry: Secondary | ICD-10-CM

## 2015-06-04 DIAGNOSIS — E876 Hypokalemia: Secondary | ICD-10-CM | POA: Diagnosis present

## 2015-06-04 DIAGNOSIS — D72829 Elevated white blood cell count, unspecified: Secondary | ICD-10-CM

## 2015-06-04 DIAGNOSIS — N289 Disorder of kidney and ureter, unspecified: Secondary | ICD-10-CM

## 2015-06-04 DIAGNOSIS — K567 Ileus, unspecified: Secondary | ICD-10-CM

## 2015-06-04 HISTORY — PX: EXPLORATORY LAPAROTOMY: SUR591

## 2015-06-04 HISTORY — PX: LAPAROTOMY: SHX154

## 2015-06-04 LAB — CBC WITH DIFFERENTIAL/PLATELET
BASOS ABS: 0 10*3/uL (ref 0.0–0.1)
Basophils Relative: 0 %
EOS ABS: 0.2 10*3/uL (ref 0.0–0.7)
Eosinophils Relative: 1 %
HCT: 38.2 % (ref 36.0–46.0)
Hemoglobin: 13.2 g/dL (ref 12.0–15.0)
LYMPHS ABS: 8 10*3/uL — AB (ref 0.7–4.0)
Lymphocytes Relative: 49 %
MCH: 30.3 pg (ref 26.0–34.0)
MCHC: 34.6 g/dL (ref 30.0–36.0)
MCV: 87.6 fL (ref 78.0–100.0)
Monocytes Absolute: 0.3 10*3/uL (ref 0.1–1.0)
Monocytes Relative: 2 %
NEUTROS ABS: 7.9 10*3/uL — AB (ref 1.7–7.7)
Neutrophils Relative %: 48 %
Platelets: 416 10*3/uL — ABNORMAL HIGH (ref 150–400)
RBC: 4.36 MIL/uL (ref 3.87–5.11)
RDW: 16.1 % — AB (ref 11.5–15.5)
WBC: 16.4 10*3/uL — ABNORMAL HIGH (ref 4.0–10.5)

## 2015-06-04 LAB — COMPREHENSIVE METABOLIC PANEL
ALT: 14 U/L (ref 14–54)
ANION GAP: 14 (ref 5–15)
AST: 24 U/L (ref 15–41)
Albumin: 3.5 g/dL (ref 3.5–5.0)
Alkaline Phosphatase: 99 U/L (ref 38–126)
BILIRUBIN TOTAL: 0.3 mg/dL (ref 0.3–1.2)
BUN: 8 mg/dL (ref 6–20)
CHLORIDE: 99 mmol/L — AB (ref 101–111)
CO2: 30 mmol/L (ref 22–32)
Calcium: 8.6 mg/dL — ABNORMAL LOW (ref 8.9–10.3)
Creatinine, Ser: 1.37 mg/dL — ABNORMAL HIGH (ref 0.44–1.00)
GFR, EST AFRICAN AMERICAN: 53 mL/min — AB (ref >60)
GFR, EST NON AFRICAN AMERICAN: 45 mL/min — AB (ref >60)
Glucose, Bld: 349 mg/dL — ABNORMAL HIGH (ref 65–99)
POTASSIUM: 3.3 mmol/L — AB (ref 3.5–5.1)
Sodium: 143 mmol/L (ref 135–145)
TOTAL PROTEIN: 7 g/dL (ref 6.5–8.1)

## 2015-06-04 LAB — GLUCOSE, CAPILLARY
GLUCOSE-CAPILLARY: 134 mg/dL — AB (ref 65–99)
GLUCOSE-CAPILLARY: 114 mg/dL — AB (ref 65–99)
Glucose-Capillary: 119 mg/dL — ABNORMAL HIGH (ref 65–99)
Glucose-Capillary: 160 mg/dL — ABNORMAL HIGH (ref 65–99)
Glucose-Capillary: 167 mg/dL — ABNORMAL HIGH (ref 65–99)

## 2015-06-04 LAB — BASIC METABOLIC PANEL
Anion gap: 10 (ref 5–15)
BUN: 14 mg/dL (ref 6–20)
CALCIUM: 8 mg/dL — AB (ref 8.9–10.3)
CHLORIDE: 113 mmol/L — AB (ref 101–111)
CO2: 24 mmol/L (ref 22–32)
CREATININE: 1.64 mg/dL — AB (ref 0.44–1.00)
GFR calc non Af Amer: 37 mL/min — ABNORMAL LOW (ref >60)
GFR, EST AFRICAN AMERICAN: 42 mL/min — AB (ref >60)
Glucose, Bld: 130 mg/dL — ABNORMAL HIGH (ref 65–99)
Potassium: 4.8 mmol/L (ref 3.5–5.1)
Sodium: 147 mmol/L — ABNORMAL HIGH (ref 135–145)

## 2015-06-04 LAB — I-STAT BETA HCG BLOOD, ED (MC, WL, AP ONLY)

## 2015-06-04 LAB — MRSA PCR SCREENING: MRSA by PCR: NEGATIVE

## 2015-06-04 LAB — CBC
HEMATOCRIT: 42.3 % (ref 36.0–46.0)
HEMOGLOBIN: 14.5 g/dL (ref 12.0–15.0)
MCH: 30.2 pg (ref 26.0–34.0)
MCHC: 34.3 g/dL (ref 30.0–36.0)
MCV: 88.1 fL (ref 78.0–100.0)
Platelets: 321 10*3/uL (ref 150–400)
RBC: 4.8 MIL/uL (ref 3.87–5.11)
RDW: 16.8 % — AB (ref 11.5–15.5)
WBC: 5.3 10*3/uL (ref 4.0–10.5)

## 2015-06-04 LAB — URINALYSIS, ROUTINE W REFLEX MICROSCOPIC
BILIRUBIN URINE: NEGATIVE
Glucose, UA: NEGATIVE mg/dL
Hgb urine dipstick: NEGATIVE
KETONES UR: 15 mg/dL — AB
LEUKOCYTES UA: NEGATIVE
NITRITE: NEGATIVE
PROTEIN: 100 mg/dL — AB
Specific Gravity, Urine: >1.046 — ABNORMAL HIGH (ref 1.005–1.030)
pH: 7.5 (ref 5.0–8.0)

## 2015-06-04 LAB — URINE MICROSCOPIC-ADD ON

## 2015-06-04 LAB — I-STAT CG4 LACTIC ACID, ED: LACTIC ACID, VENOUS: 3.68 mmol/L — AB (ref 0.5–2.0)

## 2015-06-04 LAB — PATHOLOGIST SMEAR REVIEW

## 2015-06-04 LAB — LIPASE, BLOOD: LIPASE: 33 U/L (ref 11–51)

## 2015-06-04 SURGERY — LAPAROTOMY, EXPLORATORY
Anesthesia: General | Site: Abdomen

## 2015-06-04 MED ORDER — SODIUM CHLORIDE 0.9 % IJ SOLN: 9.0000 mL | INTRAMUSCULAR | Status: DC | PRN

## 2015-06-04 MED ORDER — DIPHENHYDRAMINE HCL 50 MG/ML IJ SOLN
12.5000 mg | Freq: Four times a day (QID) | INTRAMUSCULAR | Status: DC | PRN
  Administered 2015-06-05 (×2): 12.5 mg via INTRAVENOUS
  Filled 2015-06-04 (×2): qty 1

## 2015-06-04 MED ORDER — KCL IN DEXTROSE-NACL 20-5-0.9 MEQ/L-%-% IV SOLN
INTRAVENOUS | Status: DC
  Administered 2015-06-04: 21:00:00 via INTRAVENOUS
  Administered 2015-06-04: 125 mL/h via INTRAVENOUS
  Filled 2015-06-04 (×6): qty 1000

## 2015-06-04 MED ORDER — HYDROMORPHONE HCL 1 MG/ML IJ SOLN
0.2500 mg | INTRAMUSCULAR | Status: DC | PRN
  Administered 2015-06-04 (×2): 0.5 mg via INTRAVENOUS

## 2015-06-04 MED ORDER — ROCURONIUM BROMIDE 50 MG/5ML IV SOLN
INTRAVENOUS | Status: AC
  Filled 2015-06-04: qty 1

## 2015-06-04 MED ORDER — FLUCONAZOLE IN SODIUM CHLORIDE 200-0.9 MG/100ML-% IV SOLN
200.0000 mg | INTRAVENOUS | Status: DC
  Administered 2015-06-04 – 2015-06-12 (×9): 200 mg via INTRAVENOUS
  Filled 2015-06-04 (×12): qty 100

## 2015-06-04 MED ORDER — 0.9 % SODIUM CHLORIDE (POUR BTL) OPTIME
TOPICAL | Status: DC | PRN
  Administered 2015-06-04: 6000 mL

## 2015-06-04 MED ORDER — SUFENTANIL CITRATE 50 MCG/ML IV SOLN
INTRAVENOUS | Status: DC | PRN
  Administered 2015-06-04: 10 ug via INTRAVENOUS
  Administered 2015-06-04: 5 ug via INTRAVENOUS
  Administered 2015-06-04: 10 ug via INTRAVENOUS
  Administered 2015-06-04: 15 ug via INTRAVENOUS
  Administered 2015-06-04: 10 ug via INTRAVENOUS

## 2015-06-04 MED ORDER — LIDOCAINE HCL (CARDIAC) 20 MG/ML IV SOLN
INTRAVENOUS | Status: AC
  Filled 2015-06-04: qty 5

## 2015-06-04 MED ORDER — ROCURONIUM BROMIDE 100 MG/10ML IV SOLN
INTRAVENOUS | Status: DC | PRN
  Administered 2015-06-04: 50 mg via INTRAVENOUS

## 2015-06-04 MED ORDER — NALOXONE HCL 0.4 MG/ML IJ SOLN: 0.4000 mg | INTRAMUSCULAR | Status: DC | PRN

## 2015-06-04 MED ORDER — METRONIDAZOLE IN NACL 5-0.79 MG/ML-% IV SOLN
500.0000 mg | Freq: Three times a day (TID) | INTRAVENOUS | Status: DC
  Administered 2015-06-04 – 2015-06-13 (×28): 500 mg via INTRAVENOUS
  Filled 2015-06-04 (×30): qty 100

## 2015-06-04 MED ORDER — MORPHINE SULFATE (PF) 4 MG/ML IV SOLN
4.0000 mg | Freq: Once | INTRAVENOUS | Status: AC
Start: 2015-06-04 — End: 2015-06-04
  Administered 2015-06-04: 4 mg via INTRAVENOUS
  Filled 2015-06-04: qty 1

## 2015-06-04 MED ORDER — PANTOPRAZOLE SODIUM 40 MG IV SOLR
40.0000 mg | Freq: Two times a day (BID) | INTRAVENOUS | Status: DC
  Administered 2015-06-04 – 2015-06-13 (×20): 40 mg via INTRAVENOUS
  Filled 2015-06-04 (×20): qty 40

## 2015-06-04 MED ORDER — LIDOCAINE HCL (CARDIAC) 20 MG/ML IV SOLN
INTRAVENOUS | Status: DC | PRN
  Administered 2015-06-04: 100 mg via INTRAVENOUS

## 2015-06-04 MED ORDER — ONDANSETRON HCL 4 MG/2ML IJ SOLN
4.0000 mg | Freq: Four times a day (QID) | INTRAMUSCULAR | Status: DC | PRN
  Administered 2015-06-08: 4 mg via INTRAVENOUS

## 2015-06-04 MED ORDER — SODIUM CHLORIDE 0.9 % IV SOLN
1000.0000 mL | INTRAVENOUS | Status: DC
  Administered 2015-06-04: 1000 mL via INTRAVENOUS

## 2015-06-04 MED ORDER — IOHEXOL 300 MG/ML  SOLN
100.0000 mL | Freq: Once | INTRAMUSCULAR | Status: AC | PRN
  Administered 2015-06-04: 100 mL via INTRAVENOUS

## 2015-06-04 MED ORDER — ENOXAPARIN SODIUM 40 MG/0.4ML ~~LOC~~ SOLN
40.0000 mg | SUBCUTANEOUS | Status: DC
  Administered 2015-06-05 – 2015-06-15 (×11): 40 mg via SUBCUTANEOUS
  Filled 2015-06-04 (×11): qty 0.4

## 2015-06-04 MED ORDER — MORPHINE SULFATE (PF) 4 MG/ML IV SOLN
4.0000 mg | Freq: Once | INTRAVENOUS | Status: AC
  Administered 2015-06-04: 4 mg via INTRAVENOUS
  Filled 2015-06-04: qty 1

## 2015-06-04 MED ORDER — PHENYLEPHRINE 40 MCG/ML (10ML) SYRINGE FOR IV PUSH (FOR BLOOD PRESSURE SUPPORT)
PREFILLED_SYRINGE | INTRAVENOUS | Status: AC
  Filled 2015-06-04: qty 10

## 2015-06-04 MED ORDER — PROPOFOL 10 MG/ML IV BOLUS
INTRAVENOUS | Status: DC | PRN
  Administered 2015-06-04: 150 mg via INTRAVENOUS

## 2015-06-04 MED ORDER — SUCCINYLCHOLINE CHLORIDE 20 MG/ML IJ SOLN
INTRAMUSCULAR | Status: AC
  Filled 2015-06-04: qty 1

## 2015-06-04 MED ORDER — INSULIN ASPART 100 UNIT/ML ~~LOC~~ SOLN
0.0000 [IU] | SUBCUTANEOUS | Status: DC
  Administered 2015-06-04 (×2): 3 [IU] via SUBCUTANEOUS
  Administered 2015-06-04 – 2015-06-05 (×2): 2 [IU] via SUBCUTANEOUS

## 2015-06-04 MED ORDER — NEOSTIGMINE METHYLSULFATE 10 MG/10ML IV SOLN
INTRAVENOUS | Status: DC | PRN
  Administered 2015-06-04: 3 mg via INTRAVENOUS

## 2015-06-04 MED ORDER — PHENYLEPHRINE HCL 10 MG/ML IJ SOLN
INTRAMUSCULAR | Status: DC | PRN
  Administered 2015-06-04 (×2): 80 ug via INTRAVENOUS

## 2015-06-04 MED ORDER — PIPERACILLIN-TAZOBACTAM 3.375 G IVPB 30 MIN
3.3750 g | Freq: Once | INTRAVENOUS | Status: AC
  Administered 2015-06-04: 3.375 g via INTRAVENOUS
  Filled 2015-06-04: qty 50

## 2015-06-04 MED ORDER — SODIUM CHLORIDE 0.9 % IV SOLN
1000.0000 mL | Freq: Once | INTRAVENOUS | Status: AC
  Administered 2015-06-04: 1000 mL via INTRAVENOUS

## 2015-06-04 MED ORDER — CHLORHEXIDINE GLUCONATE 0.12 % MT SOLN
15.0000 mL | Freq: Two times a day (BID) | OROMUCOSAL | Status: DC
  Administered 2015-06-04 – 2015-06-12 (×15): 15 mL via OROMUCOSAL
  Filled 2015-06-04 (×12): qty 15

## 2015-06-04 MED ORDER — LACTATED RINGERS IV SOLN
INTRAVENOUS | Status: DC | PRN
  Administered 2015-06-04 (×3): via INTRAVENOUS

## 2015-06-04 MED ORDER — NEOSTIGMINE METHYLSULFATE 10 MG/10ML IV SOLN
INTRAVENOUS | Status: AC
  Filled 2015-06-04: qty 1

## 2015-06-04 MED ORDER — CETYLPYRIDINIUM CHLORIDE 0.05 % MT LIQD
7.0000 mL | Freq: Two times a day (BID) | OROMUCOSAL | Status: DC
  Administered 2015-06-04 – 2015-06-12 (×11): 7 mL via OROMUCOSAL

## 2015-06-04 MED ORDER — SUFENTANIL CITRATE 50 MCG/ML IV SOLN
INTRAVENOUS | Status: AC
  Filled 2015-06-04: qty 1

## 2015-06-04 MED ORDER — LORAZEPAM 2 MG/ML IJ SOLN
0.5000 mg | Freq: Three times a day (TID) | INTRAMUSCULAR | Status: DC | PRN
  Filled 2015-06-04: qty 1

## 2015-06-04 MED ORDER — ONDANSETRON 4 MG PO TBDP
4.0000 mg | ORAL_TABLET | Freq: Four times a day (QID) | ORAL | Status: DC | PRN
  Administered 2015-06-10: 4 mg via ORAL
  Filled 2015-06-04: qty 1

## 2015-06-04 MED ORDER — ONDANSETRON HCL 4 MG/2ML IJ SOLN
INTRAMUSCULAR | Status: DC | PRN
  Administered 2015-06-04: 4 mg via INTRAVENOUS

## 2015-06-04 MED ORDER — DEXTROSE 5 % IV SOLN
2.0000 g | INTRAVENOUS | Status: DC
  Administered 2015-06-04: 2 g via INTRAVENOUS
  Filled 2015-06-04 (×2): qty 2

## 2015-06-04 MED ORDER — ACETAMINOPHEN 10 MG/ML IV SOLN
1000.0000 mg | Freq: Four times a day (QID) | INTRAVENOUS | Status: AC
  Administered 2015-06-04 – 2015-06-05 (×4): 1000 mg via INTRAVENOUS
  Filled 2015-06-04 (×4): qty 100

## 2015-06-04 MED ORDER — DIPHENHYDRAMINE HCL 12.5 MG/5ML PO ELIX: 12.5000 mg | ORAL_SOLUTION | Freq: Four times a day (QID) | ORAL | Status: DC | PRN

## 2015-06-04 MED ORDER — SODIUM CHLORIDE 0.9 % IJ SOLN
INTRAMUSCULAR | Status: AC
  Filled 2015-06-04: qty 10

## 2015-06-04 MED ORDER — METOPROLOL TARTRATE 1 MG/ML IV SOLN
5.0000 mg | Freq: Four times a day (QID) | INTRAVENOUS | Status: DC | PRN
  Administered 2015-06-05 – 2015-06-06 (×2): 5 mg via INTRAVENOUS
  Filled 2015-06-04 (×3): qty 5

## 2015-06-04 MED ORDER — HYDROMORPHONE 1 MG/ML IV SOLN
INTRAVENOUS | Status: DC
  Administered 2015-06-04: 1.5 mg via INTRAVENOUS
  Administered 2015-06-04: 2.1 mg via INTRAVENOUS
  Administered 2015-06-04: 1 mg via INTRAVENOUS
  Administered 2015-06-04: 1.5 mg via INTRAVENOUS
  Administered 2015-06-05: 2.1 mg via INTRAVENOUS
  Administered 2015-06-05: 2.7 mg via INTRAVENOUS
  Administered 2015-06-05: 2.4 mg via INTRAVENOUS
  Administered 2015-06-05: 1.8 mg via INTRAVENOUS
  Administered 2015-06-05: 2.4 mg via INTRAVENOUS
  Administered 2015-06-05: 1.8 mg via INTRAVENOUS
  Administered 2015-06-06: 1.2 mg via INTRAVENOUS
  Administered 2015-06-06: 0.9 mg via INTRAVENOUS
  Administered 2015-06-06: 0.4 mg via INTRAVENOUS
  Administered 2015-06-06: 2.1 mg via INTRAVENOUS
  Administered 2015-06-06: 02:00:00 via INTRAVENOUS
  Administered 2015-06-07 (×2): 2.1 mg via INTRAVENOUS
  Administered 2015-06-07: 2.4 mg via INTRAVENOUS
  Administered 2015-06-07: 1.5 mg via INTRAVENOUS
  Administered 2015-06-07: 0.6 mg via INTRAVENOUS
  Administered 2015-06-07: 3 mg via INTRAVENOUS
  Administered 2015-06-08: 0.3 mg via INTRAVENOUS
  Administered 2015-06-08: 1.8 mg via INTRAVENOUS
  Administered 2015-06-08: 15:00:00 via INTRAVENOUS
  Administered 2015-06-08: 0.9 mg via INTRAVENOUS
  Administered 2015-06-08: 2.7 mg via INTRAVENOUS
  Administered 2015-06-08 – 2015-06-09 (×2): 1.5 mg via INTRAVENOUS
  Administered 2015-06-09: 1.8 mg via INTRAVENOUS
  Administered 2015-06-10: 2.4 mg via INTRAVENOUS
  Administered 2015-06-10: 4.8 mg via INTRAVENOUS
  Administered 2015-06-10: 2.1 mg via INTRAVENOUS
  Filled 2015-06-04 (×3): qty 25

## 2015-06-04 MED ORDER — ONDANSETRON HCL 4 MG/2ML IJ SOLN
4.0000 mg | Freq: Four times a day (QID) | INTRAMUSCULAR | Status: DC | PRN
  Administered 2015-06-13 (×2): 4 mg via INTRAVENOUS
  Filled 2015-06-04 (×3): qty 2

## 2015-06-04 MED ORDER — PROMETHAZINE HCL 25 MG/ML IJ SOLN: 6.2500 mg | INTRAMUSCULAR | Status: DC | PRN

## 2015-06-04 MED ORDER — GLYCOPYRROLATE 0.2 MG/ML IJ SOLN
INTRAMUSCULAR | Status: DC | PRN
  Administered 2015-06-04: 0.2 mg via INTRAVENOUS
  Administered 2015-06-04: 0.4 mg via INTRAVENOUS

## 2015-06-04 MED ORDER — SODIUM CHLORIDE 0.9 % IV BOLUS (SEPSIS)
1000.0000 mL | Freq: Once | INTRAVENOUS | Status: AC
  Administered 2015-06-04: 1000 mL via INTRAVENOUS

## 2015-06-04 MED ORDER — LORAZEPAM BOLUS VIA INFUSION: 0.5000 mg | Freq: Three times a day (TID) | INTRAVENOUS | Status: DC | PRN

## 2015-06-04 MED ORDER — SUCCINYLCHOLINE CHLORIDE 20 MG/ML IJ SOLN
INTRAMUSCULAR | Status: DC | PRN
  Administered 2015-06-04: 120 mg via INTRAVENOUS

## 2015-06-04 MED ORDER — GLYCOPYRROLATE 0.2 MG/ML IJ SOLN
INTRAMUSCULAR | Status: AC
  Filled 2015-06-04: qty 1

## 2015-06-04 MED ORDER — METHOCARBAMOL 1000 MG/10ML IJ SOLN
1000.0000 mg | Freq: Three times a day (TID) | INTRAVENOUS | Status: DC | PRN
  Administered 2015-06-04: 1000 mg via INTRAVENOUS
  Filled 2015-06-04 (×2): qty 10

## 2015-06-04 MED ORDER — ACETAMINOPHEN 650 MG RE SUPP: 650.0000 mg | Freq: Four times a day (QID) | RECTAL | Status: DC | PRN

## 2015-06-04 MED ORDER — ACETAMINOPHEN 325 MG PO TABS: 650.0000 mg | ORAL_TABLET | Freq: Four times a day (QID) | ORAL | Status: DC | PRN

## 2015-06-04 MED ORDER — ONDANSETRON HCL 4 MG/2ML IJ SOLN
4.0000 mg | Freq: Once | INTRAMUSCULAR | Status: AC
  Administered 2015-06-04: 4 mg via INTRAVENOUS
  Filled 2015-06-04: qty 2

## 2015-06-04 MED ORDER — MIDAZOLAM HCL 2 MG/2ML IJ SOLN
INTRAMUSCULAR | Status: AC
  Filled 2015-06-04: qty 2

## 2015-06-04 MED ORDER — GLYCOPYRROLATE 0.2 MG/ML IJ SOLN
INTRAMUSCULAR | Status: AC
  Filled 2015-06-04: qty 2

## 2015-06-04 MED ORDER — HYDROMORPHONE HCL 1 MG/ML IJ SOLN
INTRAMUSCULAR | Status: AC
  Filled 2015-06-04: qty 1

## 2015-06-04 MED ORDER — PROPOFOL 10 MG/ML IV BOLUS
INTRAVENOUS | Status: AC
Start: 2015-06-04 — End: 2015-06-04
  Filled 2015-06-04: qty 20

## 2015-06-04 SURGICAL SUPPLY — 56 items
BLADE SURG ROTATE 9660 (MISCELLANEOUS) IMPLANT
BNDG GAUZE ELAST 4 BULKY (GAUZE/BANDAGES/DRESSINGS) ×3 IMPLANT
CANISTER SUCTION 2500CC (MISCELLANEOUS) ×3 IMPLANT
CHLORAPREP W/TINT 26ML (MISCELLANEOUS) IMPLANT
COVER MAYO STAND STRL (DRAPES) IMPLANT
COVER SURGICAL LIGHT HANDLE (MISCELLANEOUS) ×3 IMPLANT
DRAIN CHANNEL 19F RND (DRAIN) ×3 IMPLANT
DRAPE LAPAROSCOPIC ABDOMINAL (DRAPES) ×3 IMPLANT
DRAPE PROXIMA HALF (DRAPES) IMPLANT
DRAPE UTILITY XL STRL (DRAPES) ×6 IMPLANT
DRAPE WARM FLUID 44X44 (DRAPE) ×3 IMPLANT
DRSG OPSITE POSTOP 4X10 (GAUZE/BANDAGES/DRESSINGS) IMPLANT
DRSG OPSITE POSTOP 4X8 (GAUZE/BANDAGES/DRESSINGS) IMPLANT
DRSG PAD ABDOMINAL 8X10 ST (GAUZE/BANDAGES/DRESSINGS) ×3 IMPLANT
ELECT BLADE 6.5 EXT (BLADE) IMPLANT
ELECT CAUTERY BLADE 6.4 (BLADE) ×3 IMPLANT
ELECT REM PT RETURN 9FT ADLT (ELECTROSURGICAL) ×3
ELECTRODE REM PT RTRN 9FT ADLT (ELECTROSURGICAL) ×1 IMPLANT
EVACUATOR SILICONE 100CC (DRAIN) ×3 IMPLANT
GLOVE BIO SURGEON STRL SZ7.5 (GLOVE) ×6 IMPLANT
GLOVE BIO SURGEON STRL SZ8 (GLOVE) ×3 IMPLANT
GLOVE BIOGEL PI IND STRL 8 (GLOVE) ×2 IMPLANT
GLOVE BIOGEL PI INDICATOR 8 (GLOVE) ×4
GOWN STRL REUS W/ TWL LRG LVL3 (GOWN DISPOSABLE) ×2 IMPLANT
GOWN STRL REUS W/ TWL XL LVL3 (GOWN DISPOSABLE) ×1 IMPLANT
GOWN STRL REUS W/TWL LRG LVL3 (GOWN DISPOSABLE) ×4
GOWN STRL REUS W/TWL XL LVL3 (GOWN DISPOSABLE) ×2
HOOD SURGICAL BLUE (PROTECTIVE WEAR) ×3 IMPLANT
KIT BASIN OR (CUSTOM PROCEDURE TRAY) ×3 IMPLANT
KIT ROOM TURNOVER OR (KITS) ×3 IMPLANT
LIGASURE IMPACT 36 18CM CVD LR (INSTRUMENTS) IMPLANT
NS IRRIG 1000ML POUR BTL (IV SOLUTION) ×18 IMPLANT
PACK GENERAL/GYN (CUSTOM PROCEDURE TRAY) ×3 IMPLANT
PAD ARMBOARD 7.5X6 YLW CONV (MISCELLANEOUS) ×3 IMPLANT
PENCIL BUTTON HOLSTER BLD 10FT (ELECTRODE) IMPLANT
SOLUTION BETADINE 4OZ (MISCELLANEOUS) ×6 IMPLANT
SPECIMEN JAR LARGE (MISCELLANEOUS) IMPLANT
SPONGE GAUZE 4X4 12PLY STER LF (GAUZE/BANDAGES/DRESSINGS) ×3 IMPLANT
SPONGE LAP 18X18 X RAY DECT (DISPOSABLE) ×18 IMPLANT
STAPLER VISISTAT 35W (STAPLE) ×3 IMPLANT
SUCTION POOLE TIP (SUCTIONS) ×6 IMPLANT
SUT ETHILON 2 0 FS 18 (SUTURE) ×3 IMPLANT
SUT PDS AB 1 TP1 96 (SUTURE) ×6 IMPLANT
SUT SILK 2 0 FS (SUTURE) ×3 IMPLANT
SUT VIC AB 2-0 CT1 18 (SUTURE) ×3 IMPLANT
SUT VIC AB 2-0 SH 18 (SUTURE) ×6 IMPLANT
SUT VIC AB 3-0 SH 18 (SUTURE) ×3 IMPLANT
SUT VICRYL AB 2 0 TIES (SUTURE) ×3 IMPLANT
SUT VICRYL AB 3 0 TIES (SUTURE) ×3 IMPLANT
TAPE CLOTH SURG 6X10 WHT LF (GAUZE/BANDAGES/DRESSINGS) ×3 IMPLANT
TOWEL OR 17X24 6PK STRL BLUE (TOWEL DISPOSABLE) ×3 IMPLANT
TOWEL OR 17X26 10 PK STRL BLUE (TOWEL DISPOSABLE) ×3 IMPLANT
TRAY FOLEY CATH 16FRSI W/METER (SET/KITS/TRAYS/PACK) ×3 IMPLANT
TUBE CONNECTING 12'X1/4 (SUCTIONS) ×1
TUBE CONNECTING 12X1/4 (SUCTIONS) ×2 IMPLANT
YANKAUER SUCT BULB TIP NO VENT (SUCTIONS) IMPLANT

## 2015-06-04 NOTE — ED Notes (Signed)
Pt diaphoretic and clammy. Unable to get comfortable in bed. Stating she is having difficulty breathing.

## 2015-06-04 NOTE — Anesthesia Preprocedure Evaluation (Signed)
Anesthesia Evaluation  Patient identified by MRN, date of birth, ID band Patient awake    Reviewed: Allergy & Precautions, H&P , Patient's Chart, lab work & pertinent test results  History of Anesthesia Complications Negative for: history of anesthetic complications  Airway Mallampati: III  TM Distance: >3 FB Neck ROM: full    Dental  (+) Poor Dentition   Pulmonary Current Smoker,    Pulmonary exam normal breath sounds clear to auscultation       Cardiovascular negative cardio ROS   Rhythm:regular Rate:Tachycardia     Neuro/Psych PSYCHIATRIC DISORDERS Depression Bipolar Disorder negative neurological ROS     GI/Hepatic negative GI ROS, Neg liver ROS,   Endo/Other  negative endocrine ROS  Renal/GU negative Renal ROS     Musculoskeletal   Abdominal   Peds  Hematology negative hematology ROS (+)   Anesthesia Other Findings   Reproductive/Obstetrics negative OB ROS                             Anesthesia Physical Anesthesia Plan  ASA: III and emergent  Anesthesia Plan: General   Post-op Pain Management:    Induction: Intravenous, Cricoid pressure planned and Rapid sequence  Airway Management Planned: Oral ETT  Additional Equipment: None  Intra-op Plan:   Post-operative Plan: Possible Post-op intubation/ventilation  Informed Consent: I have reviewed the patients History and Physical, chart, labs and discussed the procedure including the risks, benefits and alternatives for the proposed anesthesia with the patient or authorized representative who has indicated his/her understanding and acceptance.   Dental Advisory Given  Plan Discussed with: Anesthesiologist, CRNA and Surgeon  Anesthesia Plan Comments:         Anesthesia Quick Evaluation

## 2015-06-04 NOTE — Anesthesia Postprocedure Evaluation (Signed)
Anesthesia Post Note  Patient: Anita BellMelanie Villarreal  Procedure(s) Performed: Procedure(s) (LRB): EXPLORATORY LAPAROTOMY WITH REPAIROF GASTRIC ULCER AND BIOPSY OF GASTRIC ULCER (N/A)  Patient location during evaluation: PACU Anesthesia Type: General Level of consciousness: sedated Pain management: pain level controlled Vital Signs Assessment: post-procedure vital signs reviewed and stable Respiratory status: spontaneous breathing, nonlabored ventilation, respiratory function stable and patient connected to nasal cannula oxygen Cardiovascular status: blood pressure returned to baseline and stable Postop Assessment: no signs of nausea or vomiting Anesthetic complications: no    Last Vitals:  Filed Vitals:   06/04/15 0700 06/04/15 0709  BP:  136/97  Pulse: 108 109  Temp:  36.1 C  Resp: 18 14    Last Pain:  Filed Vitals:   06/04/15 0716  PainSc: 10-Worst pain ever                 Reino KentJudd, Epifanio Labrador J

## 2015-06-04 NOTE — ED Provider Notes (Signed)
CSN: [id number]     Arrival date & time 06/04/15  0128 History  By signing my name below, I, Anita Villarreal, attest that this documentation has been prepared under the direction and in the presence of [name], MD. Electronically Signed: Angelene GiovanniEmmanuella Villarreal, ED Scribe. 06/04/2015. 1:52 AM.      Chief Complaint  Patient presents with  . Abdominal Pain   The history is provided by the patient. No language interpreter was used.   HPI Comments: Anita Villarreal is a 44 y.o. female who presents to the Emergency Department complaining of a gradually worsening sudden onset of severe generalized abdominal pain that radiates towards her back onset one hour PTA when she woke up from a nap. She reports associated abdominal distention, vomiting, and trouble urinating. She denies any fever or diarrhea. No alleviating factors noted. She explains that she had mild abdominal pain before sleeping tonight. She reports a hx of IBS. She also reports a hx of appendectomy and cholecystectomy. She reports that she cannot be pregnant due to her tubal ligation.   Past Medical History  Diagnosis Date  . Bipolar 1 disorder (HCC)   . Manic depressive disorder [facility](HCC)    Past Surgical History  Procedure Laterality Date  . Cholecystectomy    . Appendectomy     No family history on file. Social History  Substance Use Topics  . Smoking status: Current Every Day Smoker  . Smokeless tobacco: None  . Alcohol Use: Yes   OB History    No data available     Review of Systems  Constitutional: Negative for fever and chills.  Gastrointestinal: Positive for nausea, vomiting and abdominal pain. Negative for diarrhea.  Genitourinary: Positive for dysuria.  All other systems reviewed and are negative.     Allergies  Erythromycin and Sulfa antibiotics  Home Medications   Prior to Admission medications   Medication Sig Start Date End Date Taking? Authorizing Provider  albuterol (PROVENTIL HFA;VENTOLIN HFA) 108  (90 BASE) MCG/ACT inhaler Inhale 2 puffs into the lungs every 6 (six) hours as needed. Wheezing    Historical Provider, MD  albuterol (PROVENTIL) (2.5 MG/3ML) 0.083% nebulizer solution Take 2.5 mg by nebulization every 6 (six) hours as needed for wheezing.    Historical Provider, MD  budesonide-formoterol (SYMBICORT) 80-4.5 MCG/ACT inhaler Inhale 2 puffs into the lungs 2 (two) times daily.    Historical Provider, MD  buPROPion (WELLBUTRIN XL) 150 MG 24 hr tablet Take 150 mg by mouth every morning.    Historical Provider, MD  dicyclomine (BENTYL) 20 MG tablet Take 20 mg by mouth 4 (four) times daily.    Historical Provider, MD  gabapentin (NEURONTIN) 100 MG capsule Take 100 mg by mouth 2 (two) times daily.    Historical Provider, MD  HYDROcodone-acetaminophen (NORCO/VICODIN) 5-325 MG per tablet Take 1-2 tablets by mouth every 6 hours as needed for pain. 01/19/13   [name], PA-C  risperiDONE (RISPERDAL) 2 MG tablet Take 2 mg by mouth at bedtime.    Historical Provider, MD   Temp(Src) 97.9 F (36.6 C)  Ht 5\' 8"  (1.727 m)  Wt 245 lb (111.131 kg)  BMI 37.26 kg/m2  LMP 04/04/2015 Physical Exam  Constitutional: She is oriented to person, place, and time. She appears well-developed and well-nourished. No distress.  Pt is in severe pain.  HENT:  Head: Normocephalic and atraumatic.  Eyes: EOM are normal. Pupils are equal, round, and reactive to light.  Neck: Normal range of motion. Neck supple. No JVD  present.  Cardiovascular: Normal rate, regular rhythm and normal heart sounds.   No murmur heard. Pulmonary/Chest: Effort normal and breath sounds normal. She has no wheezes. She has no rales. She exhibits no tenderness.  Abdominal: She exhibits distension (mild). She exhibits no mass. There is tenderness.  Mildly distended Tender diffusely Bilateral CVA tenderness Bowel sounds decrease  Musculoskeletal: Normal range of motion. She exhibits no edema.  Lymphadenopathy:    She has no  cervical adenopathy.  Neurological: She is alert and oriented to person, place, and time. No cranial nerve deficit. She exhibits normal muscle tone. Coordination normal.  Skin: Skin is warm and dry. No rash noted.  Psychiatric: She has a normal mood and affect. Her behavior is normal. Judgment and thought content normal.  Nursing note and vitals reviewed.   ED Course  Procedures (including critical care time) DIAGNOSTIC STUDIES: Oxygen Saturation is 97% on RA, adequate by my interpretation.    COORDINATION OF CARE: 1:48 AM- Pt advised of plan for treatment and pt agrees. Will receive pain and nausea medication. Pt will also receive a CAT scan and blood work.    Labs Review Results for orders placed or performed during the hospital encounter of 06/04/15  CBC with Differential  Result Value Ref Range   WBC 16.4 (H) 4.0 - 10.5 K/uL   RBC 4.36 3.87 - 5.11 MIL/uL   Hemoglobin 13.2 12.0 - 15.0 g/dL   HCT 16.1 09.6 - 04.5 %   MCV 87.6 78.0 - 100.0 fL   MCH 30.3 26.0 - 34.0 pg   MCHC 34.6 30.0 - 36.0 g/dL   RDW 40.9 (H) 81.1 - 91.4 %   Platelets 416 (H) 150 - 400 K/uL   Neutrophils Relative % 48 %   Lymphocytes Relative 49 %   Monocytes Relative 2 %   Eosinophils Relative 1 %   Basophils Relative 0 %   Neutro Abs 7.9 (H) 1.7 - 7.7 K/uL   Lymphs Abs 8.0 (H) 0.7 - 4.0 K/uL   Monocytes Absolute 0.3 0.1 - 1.0 K/uL   Eosinophils Absolute 0.2 0.0 - 0.7 K/uL   Basophils Absolute 0.0 0.0 - 0.1 K/uL   WBC Morphology ATYPICAL LYMPHOCYTES   Lipase, blood  Result Value Ref Range   Lipase 33 11 - 51 U/L  Comprehensive metabolic panel  Result Value Ref Range   Sodium 143 135 - 145 mmol/L   Potassium 3.3 (L) 3.5 - 5.1 mmol/L   Chloride 99 (L) 101 - 111 mmol/L   CO2 30 22 - 32 mmol/L   Glucose, Bld 349 (H) 65 - 99 mg/dL   BUN 8 6 - 20 mg/dL   Creatinine, Ser 7.82 (H) 0.44 - 1.00 mg/dL   Calcium 8.6 (L) 8.9 - 10.3 mg/dL   Total Protein 7.0 6.5 - 8.1 g/dL   Albumin 3.5 3.5 - 5.0 g/dL    AST 24 15 - 41 U/L   ALT 14 14 - 54 U/L   Alkaline Phosphatase 99 38 - 126 U/L   Total Bilirubin 0.3 0.3 - 1.2 mg/dL   GFR calc non Af Amer 45 (L) >60 mL/min   GFR calc Af Amer 53 (L) >60 mL/min   Anion gap 14 5 - 15  I-Stat beta hCG blood, ED  Result Value Ref Range   I-stat hCG, quantitative <5.0 <5 mIU/mL   Comment 3          I-Stat CG4 Lactic Acid, ED  Result Value Ref Range   Lactic  Acid, Venous 3.68 (HH) 0.5 - 2.0 mmol/L   Comment NOTIFIED PHYSICIAN    Imaging Review Ct Abdomen Pelvis W Contrast  06/04/2015  CLINICAL DATA:  Acute onset of severe generalized abdominal pain. Initial encounter. EXAM: CT ABDOMEN AND PELVIS WITH CONTRAST TECHNIQUE: Multidetector CT imaging of the abdomen and pelvis was performed using the standard protocol following bolus administration of intravenous contrast. CONTRAST:  OMNIPAQUE IOHEXOL 300 MG/ML  SOLN COMPARISON:  None. FINDINGS: Minimal right basilar atelectasis is noted. Moderate volume ascites noted within the abdomen and pelvis. The ascites contains layering contrast, and scattered free air is also seen. Contrast extends out of the stomach at the anterior lesser curvature of the stomach, compatible with gastric perforation. The liver and spleen are unremarkable in appearance. The patient is status post cholecystectomy, with clips noted laterally. The pancreas and adrenal glands are unremarkable. The kidneys are unremarkable in appearance. There is no evidence of hydronephrosis. No renal or ureteral stones are seen. No perinephric stranding is appreciated. The small bowel is unremarkable in appearance. The stomach is within normal limits. No acute vascular abnormalities are seen. The patient is status post appendectomy. The colon is largely decompressed and grossly unremarkable in appearance. The bladder is mildly distended and grossly unremarkable. The uterus is unremarkable in appearance. The ovaries are relatively symmetric. No suspicious adnexal  masses are seen. Bilateral tubal ligation clips are noted. No inguinal lymphadenopathy is seen. No acute osseous abnormalities are identified. IMPRESSION: Acute gastric perforation noted, with contrast extending out of the stomach at the anterior lesser curvature of the stomach. Associated moderate volume ascites noted in the abdomen and pelvis, containing layering oral contrast and scattered free air. Critical Value/emergent results were called by telephone at the time of interpretation on 06/04/2015 at 3:02 am to Dr. Dione Booze, who verbally acknowledged these results. Electronically Signed   By: Roanna Raider M.D.   On: 06/04/2015 03:12     Dione Booze, MD has personally reviewed and evaluated these images and lab results as part of his medical decision-making. I have discussed the imaging findings with the radiologist.   EKG Interpretation   Date/Time:  Tuesday June 04 2015 01:38:45 EST Ventricular Rate:  106 PR Interval:  140 QRS Duration: 93 QT Interval:  400 QTC Calculation: 531 R Axis:   28 Text Interpretation:  Sinus tachycardia Low voltage, precordial leads  Borderline repol abnrm, anterolateral leads Prolonged QT interval Baseline  wander in lead(s) II III aVF V1 V2 V3 V4 V5 V6 No old tracing to compare  Confirmed by [facility]  MD, Eleana Tocco ([id number]) on 06/04/2015 1:41:39 AM      CRITICAL CARE Performed by: UEAVW,[email or web address] Total critical care time: 60 minutes Critical care time was exclusive of separately billable procedures and treating other patients. Critical care was necessary to treat or prevent imminent or life-threatening deterioration. Critical care was time spent personally by me on the following activities: development of treatment plan with patient and/or surrogate as well as nursing, discussions with consultants, evaluation of patient's response to treatment, examination of patient, obtaining history from patient or surrogate, ordering and performing treatments and  interventions, ordering and review of laboratory studies, ordering and review of radiographic studies, pulse oximetry and re-evaluation of patient's condition. MDM   Final diagnoses:  Gastric perforation, acute (HCC)  Renal insufficiency  Elevated lactic acid level    7 onset of generalized abdominal pain. Etiology is not clear initially. Consider possibility of ruptured ectopic pregnancy, perforated viscus, abdominal aneurysm. She is  given IV fluids, morphine for pain, ondansetron for nausea. Hemoglobin has come back normal making ruptured aneurysm and ruptured ectopic pregnancy much less likely. She required multiple doses of morphine and never actually got good relief of pain. CT shows evidence of gastric perforation. She is started on Zosyn and case is discussed with Dr. Luisa Hart of trauma surgery service who agrees to admit the patient for surgical management.  I personally performed the services described in this documentation, which was scribed in my presence. The recorded information has been reviewed and is accurate.      Dione Booze, MD 06/04/15 [id number]

## 2015-06-04 NOTE — Progress Notes (Signed)
Notified MD about patient's heart rate increasing 120-130s, BP gradually dropping to 80/60s and low UOP.  Order for 1L bolus received and stat CBC and BMP. Will continue to monitor.  Domenica Failebekah Pamila Mendibles, RN

## 2015-06-04 NOTE — H&P (Signed)
Anita Villarreal is an 44 y.o. female.   Chief Complaint: abdominal pain HPI: Pt with sudden onset of severe diffuse abdominal pain tonight.  No vomiting.  CT shows a gastric perforation.  Complains of severe diffuse abdominal pain sitting up in bed.  Morphine not helping.   Past Medical History  Diagnosis Date  . Bipolar 1 disorder ([place])   . Manic depressive disorder [facility])     Past Surgical History  Procedure Laterality Date  . Cholecystectomy    . Appendectomy      No family history on file. Social History:  reports that she has been smoking.  She does not have any smokeless tobacco history on file. She reports that she drinks alcohol. Her drug history is not on file.  Allergies:  Allergies  Allergen Reactions  . Erythromycin Other (See Comments)    Tongue swelling   . Sulfa Antibiotics     swelling     (Not in a hospital admission)  Results for orders placed or performed during the hospital encounter of 06/04/15 (from the past 48 hour(s))  I-Stat beta hCG blood, ED     Status: None   Collection Time: 06/04/15  2:23 AM  Result Value Ref Range   I-stat hCG, quantitative <5.0 <5 mIU/mL   Comment 3            Comment:   GEST. AGE      CONC.  (mIU/mL)   <=1 WEEK        5 - 50     2 WEEKS       50 - 500     3 WEEKS       100 - 10,000     4 WEEKS     1,000 - 30,000        FEMALE AND NON-PREGNANT FEMALE:     LESS THAN 5 mIU/mL   I-Stat CG4 Lactic Acid, ED     Status: Abnormal   Collection Time: 06/04/15  2:25 AM  Result Value Ref Range   Lactic Acid, Venous 3.68 (HH) 0.5 - 2.0 mmol/L   Comment NOTIFIED PHYSICIAN   CBC with Differential     Status: Abnormal (Preliminary result)   Collection Time: 06/04/15  2:54 AM  Result Value Ref Range   WBC 16.4 (H) 4.0 - 10.5 K/uL   RBC 4.36 3.87 - 5.11 MIL/uL   Hemoglobin 13.2 12.0 - 15.0 g/dL   HCT 38.2 36.0 - 46.0 %   MCV 87.6 78.0 - 100.0 fL   MCH 30.3 26.0 - 34.0 pg   MCHC 34.6 30.0 - 36.0 g/dL   RDW 16.1 (H) 11.5 - 15.5  %   Platelets 416 (H) 150 - 400 K/uL   Neutrophils Relative % PENDING %   Neutro Abs PENDING 1.7 - 7.7 K/uL   Band Neutrophils PENDING %   Lymphocytes Relative PENDING %   Lymphs Abs PENDING 0.7 - 4.0 K/uL   Monocytes Relative PENDING %   Monocytes Absolute PENDING 0.1 - 1.0 K/uL   Eosinophils Relative PENDING %   Eosinophils Absolute PENDING 0.0 - 0.7 K/uL   Basophils Relative PENDING %   Basophils Absolute PENDING 0.0 - 0.1 K/uL   WBC Morphology PENDING    RBC Morphology PENDING    Smear Review PENDING    nRBC PENDING 0 /100 WBC   Metamyelocytes Relative PENDING %   Myelocytes PENDING %   Promyelocytes Absolute PENDING %   Blasts PENDING %  Lipase, blood  Status: None   Collection Time: 06/04/15  2:54 AM  Result Value Ref Range   Lipase 33 11 - 51 U/L  Comprehensive metabolic panel     Status: Abnormal   Collection Time: 06/04/15  2:54 AM  Result Value Ref Range   Sodium 143 135 - 145 mmol/L   Potassium 3.3 (L) 3.5 - 5.1 mmol/L   Chloride 99 (L) 101 - 111 mmol/L   CO2 30 22 - 32 mmol/L   Glucose, Bld 349 (H) 65 - 99 mg/dL   BUN 8 6 - 20 mg/dL   Creatinine, Ser 1.37 (H) 0.44 - 1.00 mg/dL   Calcium 8.6 (L) 8.9 - 10.3 mg/dL   Total Protein 7.0 6.5 - 8.1 g/dL   Albumin 3.5 3.5 - 5.0 g/dL   AST 24 15 - 41 U/L   ALT 14 14 - 54 U/L   Alkaline Phosphatase 99 38 - 126 U/L   Total Bilirubin 0.3 0.3 - 1.2 mg/dL   GFR calc non Af Amer 45 (L) >60 mL/min   GFR calc Af Amer 53 (L) >60 mL/min    Comment: (NOTE) The eGFR has been calculated using the CKD EPI equation. This calculation has not been validated in all clinical situations. eGFR's persistently <60 mL/min signify possible Chronic Kidney Disease.    Anion gap 14 5 - 15   Ct Abdomen Pelvis W Contrast  06/04/2015  CLINICAL DATA:  Acute onset of severe generalized abdominal pain. Initial encounter. EXAM: CT ABDOMEN AND PELVIS WITH CONTRAST TECHNIQUE: Multidetector CT imaging of the abdomen and pelvis was performed  using the standard protocol following bolus administration of intravenous contrast. CONTRAST:  135m OMNIPAQUE IOHEXOL 300 MG/ML  SOLN COMPARISON:  None. FINDINGS: Minimal right basilar atelectasis is noted. Moderate volume ascites noted within the abdomen and pelvis. The ascites contains layering contrast, and scattered free air is also seen. Contrast extends out of the stomach at the anterior lesser curvature of the stomach, compatible with gastric perforation. The liver and spleen are unremarkable in appearance. The patient is status post cholecystectomy, with clips noted laterally. The pancreas and adrenal glands are unremarkable. The kidneys are unremarkable in appearance. There is no evidence of hydronephrosis. No renal or ureteral stones are seen. No perinephric stranding is appreciated. The small bowel is unremarkable in appearance. The stomach is within normal limits. No acute vascular abnormalities are seen. The patient is status post appendectomy. The colon is largely decompressed and grossly unremarkable in appearance. The bladder is mildly distended and grossly unremarkable. The uterus is unremarkable in appearance. The ovaries are relatively symmetric. No suspicious adnexal masses are seen. Bilateral tubal ligation clips are noted. No inguinal lymphadenopathy is seen. No acute osseous abnormalities are identified. IMPRESSION: Acute gastric perforation noted, with contrast extending out of the stomach at the anterior lesser curvature of the stomach. Associated moderate volume ascites noted in the abdomen and pelvis, containing layering oral contrast and scattered free air. Critical Value/emergent results were called by telephone at the time of interpretation on 06/04/2015 at 3:02 am to Dr. DDelora Fuel who verbally acknowledged these results. Electronically Signed   By: JGarald BaldingM.D.   On: 06/04/2015 03:12    Review of Systems  Unable to perform ROS   Blood pressure 116/90, pulse 102,  temperature 97.9 F (36.6 C), resp. rate 20, height 5' 8"  (1.727 m), weight 111.131 kg (245 lb), last menstrual period 04/04/2015, SpO2 96 %. Physical Exam  Constitutional: She is oriented to person, place, and  time. She appears distressed.  HENT:  Head: Normocephalic.  Eyes: Pupils are equal, round, and reactive to light.  Neck: Normal range of motion. Neck supple.  Cardiovascular: Tachycardia present.   Respiratory: Effort normal. No respiratory distress.  GI: She exhibits distension. There is tenderness. There is rebound and guarding.  Musculoskeletal: Normal range of motion.  Neurological: She is alert and oriented to person, place, and time.  Skin: Skin is warm. She is diaphoretic.  Psychiatric: Her mood appears anxious. She is agitated.     Assessment/Plan Perforated viscous with peritonitis probable gastric ulcer perforation  OR ex lap to repair perforation  The procedure has been discussed with the patient.  Alternative therapies have been discussed with the patient.  Operative risks include bleeding,  Infection,  Organ injury,  Nerve injury,  Blood vessel injury,  DVT,  Pulmonary embolism,  Death,  And possible reoperation.  Medical management risks include worsening of present situation.  The success of the procedure is 50 -90 % at treating patients symptoms.  The patient understands and agrees to proceed.   Kj Imbert A. 06/04/2015, 3:36 AM

## 2015-06-04 NOTE — Progress Notes (Signed)
Patient just had surgery early this am.  PCA controlling pain.  No N/V.  Tachycardic and Tachypnea.  Nursing staff working on IS, but patient can only get to 500.  NPO, IVF, NG tube.  Family at bedside. Abdomen is soft, appropriately tender, no BS yet, some sanguinous drainage on midline dressing.  -Continue IV antibiotics (rocephin and flagyl), add diflucan given large milky food contents found during surgery -Add robaxin, IV tylenol, and ice, hopefully will help with tachycardia -Encouraged IS and added flutter valve -Added metoprolol for tachycardia prn -Ativan prn for anxiety, resume home psych meds when we clear her to take PO's.  DVT proph - SCD's and lovenox  At some point will likely need UGI study to check for leak before starting clears.  Would assume she will likely be here +/- a week.  Await biopsy results.  Start dressing changes tomorrow.

## 2015-06-04 NOTE — Anesthesia Procedure Notes (Signed)
Procedure Name: Intubation Date/Time: 06/04/2015 4:20 AM Performed by: Melina SchoolsBANKS, Veyda Kaufman J Pre-anesthesia Checklist: Patient identified, Emergency Drugs available, Suction available, Patient being monitored and Timeout performed Patient Re-evaluated:Patient Re-evaluated prior to inductionOxygen Delivery Method: Circle system utilized Preoxygenation: Pre-oxygenation with 100% oxygen Intubation Type: IV induction, Rapid sequence and Cricoid Pressure applied Laryngoscope Size: Mac and 4 Grade View: Grade I Tube type: Oral Tube size: 8.0 mm Number of attempts: 1 Airway Equipment and Method: Stylet Placement Confirmation: ETT inserted through vocal cords under direct vision,  positive ETCO2 and breath sounds checked- equal and bilateral Secured at: 23 cm Tube secured with: Tape Dental Injury: Teeth and Oropharynx as per pre-operative assessment

## 2015-06-04 NOTE — Transfer of Care (Signed)
Immediate Anesthesia Transfer of Care Note  Patient: Anita Villarreal  Procedure(s) Performed: Procedure(s): EXPLORATORY LAPAROTOMY WITH REPAIROF GASTRIC ULCER AND BIOPSY OF GASTRIC ULCER (N/A)  Patient Location: PACU  Anesthesia Type:General  Level of Consciousness: sedated, patient cooperative and responds to stimulation  Airway & Oxygen Therapy: Patient Spontanous Breathing and Patient connected to nasal cannula oxygen  Post-op Assessment: Report given to RN, Post -op Vital signs reviewed and stable and Patient moving all extremities X 4  Post vital signs: Reviewed and stable  Last Vitals:  Filed Vitals:   06/04/15 0143 06/04/15 0200  BP: 113/89 116/90  Pulse:  102  Temp:    Resp:  20    Complications: No apparent anesthesia complications

## 2015-06-04 NOTE — ED Notes (Signed)
Ems administered fentanyl pta.

## 2015-06-04 NOTE — Op Note (Signed)
Preoperative diagnosis: Peritonitis with gastric perforation  Postoperative diagnosis: Gastric perforation lesser curvature of stomach with purulent contamination of the abdominal cavity  Procedure: Exploratory laparotomy closure of gastric ulcer using omental patch  Surgeon: Harriette Bouillonhomas Josette Shimabukuro M.D.  Anesthesia: Gen. endotracheal anesthesia  Specimens: Gastric biopsy  Drains: 19 round drain to upper peritoneal cavity  EBL: 200 mL  Indications for procedure: The patient presents emergently to the operating room due to gastric perforation discovered on CT scan. She developed severe upper abdominal pain at 1 AM this morning was brought by family to the emergency room. CT scan showed perforation at the lesser curvature the stomach gross contamination of the abdominal cavity of gastric contents. Eye examination she was distended with peritonitis. She was brought emergently to the operating room after discussion of her condition with her family.The procedure has been discussed with the patient.  Alternative therapies have been discussed with the patient.  Operative risks include bleeding,  Infection,  Organ injury,  Nerve injury,  Blood vessel injury,  DVT,  Pulmonary embolism,  Death,  And possible reoperation.  Medical management risks include worsening of present situation.  The success of the procedure is 50 -90 % at treating patients symptoms.  The patient understands and agrees to proceed.  Description of procedure: The patient is brought to the operating room and placed supine. After induction of general endotracheal esthesia Foley catheter placed sterile conditions and nasal gastric tube was placed. Abdomen was prepped and draped in sterile fashion and timeout was done. She received Zosyn preoperatively in the emergency room. Upper midline incision was used and dissection was carried to the midline into the abdominal cavity is entered. There is over 2 L of purulent material in the bowel cavity and  free air. I suctioned this out and placed a retraction device into the adult cavity for better visualization. Upon examination stomach I had opened the lesser sac and discovered a perforation have the lesser curvature of the stomach at the angularis. I was able to grab both sides of the perforation using [place]. Multiple biopsies were taken. I saw no evidence of cancer though grossly. This was closed using 2-0 Vicryl. A time of omentum was then placed over the closure and buttressed with the tails of the 2-0 Vicryl sutures. 6 L of irrigation were used to irrigate out the abdominal cavity. There is separate stab incision in the left upper quadrant around drain was placed near the repair site. Nasogastric tube was securely in the stomach. Fashion was then closed using a running double-stranded PDS 0. Skin packed open. All final counts sponge, needles and this was found to be correct. Patient was extubated taken to recovery in stable condition.

## 2015-06-04 NOTE — ED Notes (Signed)
Per ems-- pt reports severe abdominal pain onset 1 hour pta that woke her from sleep. Last bm today that was normal. Vomited x 1. Pt with hx of gallbladder surgery. Pt took 2 goodys powders before she went to sleep tonight. sts pain is radiating to back now.

## 2015-06-05 ENCOUNTER — Encounter (HOSPITAL_COMMUNITY): Payer: Self-pay | Admitting: Surgery

## 2015-06-05 LAB — BASIC METABOLIC PANEL
Anion gap: 10 (ref 5–15)
BUN: 16 mg/dL (ref 6–20)
CALCIUM: 8.3 mg/dL — AB (ref 8.9–10.3)
CO2: 22 mmol/L (ref 22–32)
CREATININE: 1.86 mg/dL — AB (ref 0.44–1.00)
Chloride: 113 mmol/L — ABNORMAL HIGH (ref 101–111)
GFR calc non Af Amer: 31 mL/min — ABNORMAL LOW (ref >60)
GFR, EST AFRICAN AMERICAN: 36 mL/min — AB (ref >60)
Glucose, Bld: 100 mg/dL — ABNORMAL HIGH (ref 65–99)
Potassium: 5.3 mmol/L — ABNORMAL HIGH (ref 3.5–5.1)
Sodium: 145 mmol/L (ref 135–145)

## 2015-06-05 LAB — GLUCOSE, CAPILLARY
GLUCOSE-CAPILLARY: 123 mg/dL — AB (ref 65–99)
GLUCOSE-CAPILLARY: 98 mg/dL (ref 65–99)
Glucose-Capillary: 111 mg/dL — ABNORMAL HIGH (ref 65–99)
Glucose-Capillary: 90 mg/dL (ref 65–99)
Glucose-Capillary: 90 mg/dL (ref 65–99)

## 2015-06-05 LAB — CBC
HEMATOCRIT: 43.5 % (ref 36.0–46.0)
Hemoglobin: 14.6 g/dL (ref 12.0–15.0)
MCH: 29.9 pg (ref 26.0–34.0)
MCHC: 33.6 g/dL (ref 30.0–36.0)
MCV: 89 fL (ref 78.0–100.0)
Platelets: 303 10*3/uL (ref 150–400)
RBC: 4.89 MIL/uL (ref 3.87–5.11)
RDW: 17.1 % — AB (ref 11.5–15.5)
WBC: 8 10*3/uL (ref 4.0–10.5)

## 2015-06-05 MED ORDER — SODIUM CHLORIDE 0.9 % IV BOLUS (SEPSIS)
1000.0000 mL | Freq: Once | INTRAVENOUS | Status: AC
Start: 2015-06-05 — End: 2015-06-05
  Administered 2015-06-05: 1000 mL via INTRAVENOUS

## 2015-06-05 MED ORDER — LORAZEPAM 2 MG/ML IJ SOLN
1.0000 mg | Freq: Three times a day (TID) | INTRAMUSCULAR | Status: DC | PRN
  Administered 2015-06-05 – 2015-06-08 (×3): 1 mg via INTRAVENOUS
  Filled 2015-06-05 (×3): qty 1

## 2015-06-05 MED ORDER — ZIPRASIDONE MESYLATE 20 MG IM SOLR
40.0000 mg | Freq: Two times a day (BID) | INTRAMUSCULAR | Status: DC
  Administered 2015-06-05 – 2015-06-06 (×2): 40 mg via INTRAMUSCULAR
  Filled 2015-06-05 (×4): qty 40

## 2015-06-05 MED ORDER — RISPERIDONE 2 MG PO TBDP
2.0000 mg | ORAL_TABLET | Freq: Every day | ORAL | Status: DC
  Administered 2015-06-05: 2 mg via ORAL
  Filled 2015-06-05 (×6): qty 1

## 2015-06-05 MED ORDER — DEXTROSE 5 % IV SOLN
1.0000 g | INTRAVENOUS | Status: DC
  Administered 2015-06-06 – 2015-06-13 (×8): 1 g via INTRAVENOUS
  Filled 2015-06-05 (×8): qty 10

## 2015-06-05 MED ORDER — DEXTROSE-NACL 5-0.9 % IV SOLN
INTRAVENOUS | Status: DC
Start: 2015-06-05 — End: 2015-06-10
  Administered 2015-06-05: 125 mL via INTRAVENOUS
  Administered 2015-06-05 – 2015-06-07 (×3): via INTRAVENOUS
  Administered 2015-06-07: 125 mL via INTRAVENOUS
  Administered 2015-06-08 (×2): 125 mL/h via INTRAVENOUS
  Administered 2015-06-09 – 2015-06-10 (×3): via INTRAVENOUS

## 2015-06-05 NOTE — Progress Notes (Signed)
[facility] Progress Note  1 Day Post-Op  Subjective: Pt wants ginger ale and ice/water.  We re-explained why she can't have any.  No N/V, pain well controlled.  No flatus or BM yet.  Up to chair this am.  Didn't remember anything we talked about yesterday.    Objective: Vital signs in last 24 hours: Temp:  [97.4 F (36.3 C)-98.5 F (36.9 C)] 97.4 F (36.3 C) (11/30 0744) Pulse Rate:  [87-136] 136 (11/30 0700) Resp:  [13-38] 28 (11/30 0700) BP: (82-118)/(56-94) 109/86 mmHg (11/30 0700) SpO2:  [96 %-100 %] 97 % (11/30 0700)    Intake/Output from previous day: 11/29 0701 - 11/30 0700 In: 4687 [I.V.:2787; NG/GT:90; IV Piggyback:1810] Out: 1795 [Urine:795; Emesis/NG output:750; Drains:250] Intake/Output this shift:    PE: Gen:  Alert, NAD, pleasant Card:  RRR, no M/G/R heard Pulm:  CTA, no W/R/R, IS up to 800 Abd: Soft, distended, tender over incision and drain, +BS, no HSM, dressing in place with minimal sanguinous drainage, drain with serous drainage   Lab Results:   Recent Labs  06/04/15 1800 06/05/15 0300  WBC 5.3 8.0  HGB 14.5 14.6  HCT 42.3 43.5  PLT 321 303   BMET  Recent Labs  06/04/15 2033 06/05/15 0300  NA 147* 145  K 4.8 5.3*  CL 113* 113*  CO2 24 22  GLUCOSE 130* 100*  BUN 14 16  CREATININE 1.64* 1.86*  CALCIUM 8.0* 8.3*   PT/INR No results for input(s): LABPROT, INR in the last 72 hours. CMP     Component Value Date/Time   NA 145 06/05/2015 0300   K 5.3* 06/05/2015 0300   CL 113* 06/05/2015 0300   CO2 22 06/05/2015 0300   GLUCOSE 100* 06/05/2015 0300   BUN 16 06/05/2015 0300   CREATININE 1.86* 06/05/2015 0300   CALCIUM 8.3* 06/05/2015 0300   PROT 7.0 06/04/2015 0254   ALBUMIN 3.5 06/04/2015 0254   AST 24 06/04/2015 0254   ALT 14 06/04/2015 0254   ALKPHOS 99 06/04/2015 0254   BILITOT 0.3 06/04/2015 0254   GFRNONAA 31* 06/05/2015 0300   GFRAA 36* 06/05/2015 0300   Lipase     Component Value Date/Time   LIPASE 33  06/04/2015 0254       Studies/Results: Ct Abdomen Pelvis W Contrast  06/04/2015  CLINICAL DATA:  Acute onset of severe generalized abdominal pain. Initial encounter. EXAM: CT ABDOMEN AND PELVIS WITH CONTRAST TECHNIQUE: Multidetector CT imaging of the abdomen and pelvis was performed using the standard protocol following bolus administration of intravenous contrast. CONTRAST:  100mL OMNIPAQUE IOHEXOL 300 MG/ML  SOLN COMPARISON:  None. FINDINGS: Minimal right basilar atelectasis is noted. Moderate volume ascites noted within the abdomen and pelvis. The ascites contains layering contrast, and scattered free air is also seen. Contrast extends out of the stomach at the anterior lesser curvature of the stomach, compatible with gastric perforation. The liver and spleen are unremarkable in appearance. The patient is status post cholecystectomy, with clips noted laterally. The pancreas and adrenal glands are unremarkable. The kidneys are unremarkable in appearance. There is no evidence of hydronephrosis. No renal or ureteral stones are seen. No perinephric stranding is appreciated. The small bowel is unremarkable in appearance. The stomach is within normal limits. No acute vascular abnormalities are seen. The patient is status post appendectomy. The colon is largely decompressed and grossly unremarkable in appearance. The bladder is mildly distended and grossly unremarkable. The uterus is unremarkable in appearance. The ovaries are relatively symmetric.  No suspicious adnexal masses are seen. Bilateral tubal ligation clips are noted. No inguinal lymphadenopathy is seen. No acute osseous abnormalities are identified. IMPRESSION: Acute gastric perforation noted, with contrast extending out of the stomach at the anterior lesser curvature of the stomach. Associated moderate volume ascites noted in the abdomen and pelvis, containing layering oral contrast and scattered free air. Critical Value/emergent results were called  by telephone at the time of interpretation on 06/04/2015 at 3:02 am to Dr. Dione Booze, who verbally acknowledged these results. Electronically Signed   By: Roanna Raider M.D.   On: 06/04/2015 03:12    Anti-infectives: Anti-infectives    Start     Dose/Rate Route Frequency Ordered Stop   06/04/15 1500  fluconazole (DIFLUCAN) IVPB 200 mg     200 mg 100 mL/hr over 60 Minutes Intravenous Every 24 hours 06/04/15 1337     06/04/15 1000  cefTRIAXone (ROCEPHIN) 2 g in dextrose 5 % 50 mL IVPB     2 g 100 mL/hr over 30 Minutes Intravenous Every 24 hours 06/04/15 0802     06/04/15 1000  metroNIDAZOLE (FLAGYL) IVPB 500 mg     500 mg 100 mL/hr over 60 Minutes Intravenous Every 8 hours 06/04/15 0802     06/04/15 0315  piperacillin-tazobactam (ZOSYN) IVPB 3.375 g     3.375 g 100 mL/hr over 30 Minutes Intravenous  Once 06/04/15 0305 06/04/15 0350       Assessment/Plan Gastric perforation lesser curvature of stomach with purulent contamination of the abdominal cavity POD #1 s/p Ex Lap, closure of gastric ulcer with omental patch -Continue IV antibiotics Day #2 (rocephin, flagyl, diflucan) -Robaxin, IV tylenol, and ice, hopefully will help with tachycardia -Encouraged IS and added flutter valve - IS up to 800 today -Added metoprolol for tachycardia prn -Ativan prn for anxiety, resume home psych meds when we clear her to take PO's. -UGI study Saturday to check for leak before starting clears.  -Await biopsy results.  -Start dressing changes to open wound tomorrow. -IS and mobilization -Low urine OP, give bolus, remove K in IVF -Consider d/c foley tomorrow if UOP improved Bipolar 1 disorder/MDD - will start home meds when able to take PO DVT proph - SCD's and lovenox Disp - Would assume she will likely be here +/- a week.     LOS: 1 day    Nonie Hoyer 06/05/2015, 7:56 AM Pager: [phone]

## 2015-06-05 NOTE — Clinical Documentation Improvement (Signed)
General Surgery   Patient with Peritonitis, Lactate (3.68), pulse > 100, resp (14-24), if following diagnoses are appropriate please document in notes? Thank you   Sepsis - specify causative organism if known}  Sirs  Other  Clinically Undetermined   Treatment Surgical repair of ulcer  Rocephin, Flagyl IV bolused 2000 ml NS IV      Please exercise your independent, professional judgment when responding. A specific answer is not anticipated or expected.   Thank You,  Lavonda JumboLawanda J Donn [facility] [employer] [facility] [place] [phone]

## 2015-06-05 NOTE — Progress Notes (Signed)
Pt very concerned about missing doses of Lithium and Geodon. [facility] to starting Geodon IM, and increasing ativan dose.  Pt also continues to deny "drinking" ice chips, NG output continues to be high and clear in color.

## 2015-06-06 DIAGNOSIS — K255 Chronic or unspecified gastric ulcer with perforation: Secondary | ICD-10-CM

## 2015-06-06 DIAGNOSIS — L02211 Cutaneous abscess of abdominal wall: Secondary | ICD-10-CM

## 2015-06-06 HISTORY — DX: Chronic or unspecified gastric ulcer with perforation: K25.5

## 2015-06-06 HISTORY — DX: Cutaneous abscess of abdominal wall: L02.211

## 2015-06-06 LAB — BASIC METABOLIC PANEL
Anion gap: 8 (ref 5–15)
BUN: 19 mg/dL (ref 6–20)
CALCIUM: 8.3 mg/dL — AB (ref 8.9–10.3)
CO2: 23 mmol/L (ref 22–32)
CREATININE: 1.31 mg/dL — AB (ref 0.44–1.00)
Chloride: 114 mmol/L — ABNORMAL HIGH (ref 101–111)
GFR calc Af Amer: 56 mL/min — ABNORMAL LOW (ref >60)
GFR, EST NON AFRICAN AMERICAN: 48 mL/min — AB (ref >60)
Glucose, Bld: 98 mg/dL (ref 65–99)
Potassium: 4 mmol/L (ref 3.5–5.1)
Sodium: 145 mmol/L (ref 135–145)

## 2015-06-06 LAB — GLUCOSE, CAPILLARY
GLUCOSE-CAPILLARY: 102 mg/dL — AB (ref 65–99)
GLUCOSE-CAPILLARY: 86 mg/dL (ref 65–99)
GLUCOSE-CAPILLARY: 94 mg/dL (ref 65–99)
Glucose-Capillary: 88 mg/dL (ref 65–99)
Glucose-Capillary: 112 mg/dL — ABNORMAL HIGH (ref 65–99)
Glucose-Capillary: 106 mg/dL — ABNORMAL HIGH (ref 65–99)
Glucose-Capillary: 90 mg/dL (ref 65–99)

## 2015-06-06 LAB — CBC
HCT: 35.4 % — ABNORMAL LOW (ref 36.0–46.0)
HEMOGLOBIN: 11.6 g/dL — AB (ref 12.0–15.0)
MCH: 29.4 pg (ref 26.0–34.0)
MCHC: 32.8 g/dL (ref 30.0–36.0)
MCV: 89.6 fL (ref 78.0–100.0)
Platelets: 252 10*3/uL (ref 150–400)
RBC: 3.95 MIL/uL (ref 3.87–5.11)
RDW: 17.5 % — ABNORMAL HIGH (ref 11.5–15.5)
WBC: 5.8 10*3/uL (ref 4.0–10.5)

## 2015-06-06 MED ORDER — HALOPERIDOL LACTATE 5 MG/ML IJ SOLN
5.0000 mg | Freq: Once | INTRAMUSCULAR | Status: AC
  Administered 2015-06-06: 5 mg via INTRAVENOUS

## 2015-06-06 MED ORDER — ZIPRASIDONE MESYLATE 20 MG IM SOLR
20.0000 mg | Freq: Two times a day (BID) | INTRAMUSCULAR | Status: DC
  Administered 2015-06-07: 20 mg via INTRAMUSCULAR
  Filled 2015-06-06 (×4): qty 20

## 2015-06-06 MED ORDER — HALOPERIDOL LACTATE 5 MG/ML IJ SOLN: 10.0000 mg | Freq: Once | INTRAMUSCULAR | Status: DC

## 2015-06-06 MED ORDER — HALOPERIDOL LACTATE 5 MG/ML IJ SOLN
INTRAMUSCULAR | Status: AC
  Filled 2015-06-06: qty 1

## 2015-06-06 MED ORDER — INFLUENZA VAC SPLIT QUAD 0.5 ML IM SUSY
0.5000 mL | PREFILLED_SYRINGE | INTRAMUSCULAR | Status: AC
  Administered 2015-06-11: 0.5 mL via INTRAMUSCULAR
  Filled 2015-06-06 (×2): qty 0.5

## 2015-06-06 MED ORDER — HALOPERIDOL LACTATE 5 MG/ML IJ SOLN
5.0000 mg | Freq: Once | INTRAMUSCULAR | Status: AC
  Administered 2015-06-06: 5 mg via INTRAVENOUS
  Filled 2015-06-06: qty 1

## 2015-06-06 NOTE — Progress Notes (Signed)
Dilauded PCA syringe replaced in PCA pump.  Wasted remaining 3 mg of dilaudid in PCA syringe into sink.  Witnessed by Caryn BeeKevin, RN and Karle BarrJames RN on 2S.

## 2015-06-06 NOTE — Progress Notes (Signed)
2 Days Post-Op  Subjective: Pt doing well.  Non compliant with mobilization.  Objective: Vital signs in last 24 hours: Temp:  [97.4 F (36.3 C)-99.8 F (37.7 C)] 98.3 F (36.8 C) (12/01 0723) Pulse Rate:  [29-141] 127 (12/01 0700) Resp:  [13-29] 21 (12/01 0700) BP: (90-126)/(53-96) 103/80 mmHg (12/01 0700) SpO2:  [86 %-100 %] 100 % (12/01 0700)    Intake/Output from previous day: 11/30 0701 - 12/01 0700 In: 4136.5 [I.V.:2636.5; IV Piggyback:1500] Out: 2065 [Urine:1065; Emesis/NG output:950; Drains:50] Intake/Output this shift:    General appearance: alert and cooperative GI: soft, wound c/d/i, clean base, active BS  Lab Results:   Recent Labs  06/05/15 0300 06/06/15 0344  WBC 8.0 5.8  HGB 14.6 11.6*  HCT 43.5 35.4*  PLT 303 252   BMET  Recent Labs  06/05/15 0300 06/06/15 0344  NA 145 145  K 5.3* 4.0  CL 113* 114*  CO2 22 23  GLUCOSE 100* 98  BUN 16 19  CREATININE 1.86* 1.31*  CALCIUM 8.3* 8.3*    Anti-infectives: Anti-infectives    Start     Dose/Rate Route Frequency Ordered Stop   06/06/15 1000  cefTRIAXone (ROCEPHIN) 1 g in dextrose 5 % 50 mL IVPB     1 g 100 mL/hr over 30 Minutes Intravenous Every 24 hours 06/05/15 1120     06/04/15 1500  fluconazole (DIFLUCAN) IVPB 200 mg     200 mg 100 mL/hr over 60 Minutes Intravenous Every 24 hours 06/04/15 1337     06/04/15 1000  cefTRIAXone (ROCEPHIN) 2 g in dextrose 5 % 50 mL IVPB  Status:  Discontinued     2 g 100 mL/hr over 30 Minutes Intravenous Every 24 hours 06/04/15 0802 06/05/15 1120   06/04/15 1000  metroNIDAZOLE (FLAGYL) IVPB 500 mg     500 mg 100 mL/hr over 60 Minutes Intravenous Every 8 hours 06/04/15 0802     06/04/15 0315  piperacillin-tazobactam (ZOSYN) IVPB 3.375 g     3.375 g 100 mL/hr over 30 Minutes Intravenous  Once 06/04/15 0305 06/04/15 0350      Assessment/Plan: s/p Procedure(s): EXPLORATORY LAPAROTOMY WITH REPAIROF GASTRIC ULCER AND BIOPSY OF GASTRIC ULCER  (N/A) Mobilize Con't NPO, Con't NGT Wound dressing changes BID UGI Sat   LOS: 2 days    Marigene Ehlersamirez Jr., [facility] 06/06/2015

## 2015-06-07 DIAGNOSIS — F3160 Bipolar disorder, current episode mixed, unspecified: Secondary | ICD-10-CM | POA: Diagnosis present

## 2015-06-07 LAB — GLUCOSE, CAPILLARY
GLUCOSE-CAPILLARY: 96 mg/dL (ref 65–99)
GLUCOSE-CAPILLARY: 108 mg/dL — AB (ref 65–99)
Glucose-Capillary: 104 mg/dL — ABNORMAL HIGH (ref 65–99)
Glucose-Capillary: 82 mg/dL (ref 65–99)

## 2015-06-07 MED ORDER — ZIPRASIDONE MESYLATE 20 MG IM SOLR
40.0000 mg | Freq: Two times a day (BID) | INTRAMUSCULAR | Status: DC
  Administered 2015-06-07 – 2015-06-09 (×5): 40 mg via INTRAMUSCULAR
  Filled 2015-06-07 (×10): qty 40

## 2015-06-07 MED ORDER — HYDROXYZINE HCL 50 MG/ML IM SOLN
50.0000 mg | Freq: Every day | INTRAMUSCULAR | Status: DC
  Administered 2015-06-07 – 2015-06-09 (×3): 50 mg via INTRAMUSCULAR
  Filled 2015-06-07 (×5): qty 1

## 2015-06-07 NOTE — Progress Notes (Signed)
At shift change, pt asked for ice chips and water, and it was explained to pt why she is unable to have those items at this time r/t her npo status. Pt apparently called her sister, Barth Kirkseri, afterwards stating she is going to die from a dry mouth. Pt's sister spoke to director of the unit about the plan of care for the patient. Pt told nurse that she is ready to go and [facility]. Pt states if she wanted to, she can get out of bed herself and get some water. RN re-educated pt on the reasons she should not get out of bed by herself and why she is currently NPO. Bed alarm turned on.

## 2015-06-07 NOTE — Consult Note (Signed)
[facility] Face-to-Face Psychiatry Consult   Reason for Consult:  Status post abdominal surgery and bipolar medication management Referring Physician:  CCS MD Patient Identification: Anita Villarreal MRN:  [id number] Principal Diagnosis: Bipolar I disorder, most recent episode mixed [facility]) Diagnosis:   Patient Active Problem List   Diagnosis Date Noted  . Bipolar I disorder, most recent episode mixed ([place]) [F31.60] 06/07/2015  . Acute gastric perforation (H[facility]) [K25.1] 06/04/2015    Total Time spent with patient: 1 hour  Subjective:   Anita Villarreal is a 44 y.o. female patient admitted with acute abdominal pain.  HPI:  Anita Villarreal is a 44 y.o. Female seen, chart reviewed and for psychiatric consultation and evaluation of bipolar disorder and medication management. Case discussed with staff RN on the unit. Patient is admitted to the [facility] for worsening abdominal pain and then found to have acute gastric perforation and underwent surgery. Patient was not able to take her regular home medication because of gastric surgery. Patient has been highly guarded, poorly cooperative and also manipulated to asking for ice water when she knows she is supposed not to drink and her current status is nothing by mouth. Patient has been delusional, paranoid, guarded and poorly cooperative to with the providing history of psychiatric illness except saying she has been diagnosed with the bipolar disorder and taking medication from outpatient psychiatric services. Patient refused to provide details about her medication and details about the provider and family during my evaluation. Patient denies current suicidal/homicidal ideation, intention or plan.   Past Psychiatric History: History of bipolar disorder and no history of [facility] admissions.  Risk to Self: Is patient at risk for suicide?: No Risk to Others:   Prior Inpatient Therapy:   Prior Outpatient Therapy:    Past Medical History:   Past Medical History  Diagnosis Date  . Bipolar 1 disorder ([place])   . Manic depressive disorder [facility])     Past Surgical History  Procedure Laterality Date  . Cholecystectomy    . Appendectomy    . Exploratory laparotomy  06/04/15    Phillip Heal patch repair of gastric ulcer  . Laparotomy N/A 06/04/2015    Procedure: EXPLORATORY LAPAROTOMY WITH REPAIROF GASTRIC ULCER AND BIOPSY OF GASTRIC ULCER;  Surgeon: Erroll Luna, MD;  Location: [place] OR;  Service: General;  Laterality: N/A;   Family History: History reviewed. No pertinent family history. Family Psychiatric  History:significant for bipolar disorder and her uncle Social History:  History  Alcohol Use  . Yes     History  Drug Use Not on file    Social History   Social History  . Marital Status: Single    Spouse Name: N/A  . Number of Children: N/A  . Years of Education: N/A   Social History Main Topics  . Smoking status: Current Every Day Smoker  . Smokeless tobacco: None  . Alcohol Use: Yes  . Drug Use: None  . Sexual Activity: Not Asked   Other Topics Concern  . None   Social History Narrative   Additional Social History:                          Allergies:   Allergies  Allergen Reactions  . Erythromycin Other (See Comments)    Tongue swelling   . Sulfa Antibiotics     swelling    Labs:  Results for orders placed or performed during the hospital encounter of 06/04/15 (from the past 48 hour(s))  Glucose, capillary     Status: None   Collection Time: 06/05/15 12:48 PM  Result Value Ref Range   Glucose-Capillary 90 65 - 99 mg/dL   Comment 1 Capillary Specimen    Comment 2 Notify RN   Glucose, capillary     Status: None   Collection Time: 06/05/15  4:06 PM  Result Value Ref Range   Glucose-Capillary 90 65 - 99 mg/dL   Comment 1 Capillary Specimen    Comment 2 Notify RN   Glucose, capillary     Status: None   Collection Time: 06/05/15  7:42 PM  Result Value Ref Range   Glucose-Capillary 98  65 - 99 mg/dL   Comment 1 Capillary Specimen    Comment 2 Notify RN    Comment 3 Document in Chart   Glucose, capillary     Status: None   Collection Time: 06/05/15 11:43 PM  Result Value Ref Range   Glucose-Capillary 86 65 - 99 mg/dL   Comment 1 Capillary Specimen    Comment 2 Notify RN    Comment 3 Document in Chart   CBC     Status: Abnormal   Collection Time: 06/06/15  3:44 AM  Result Value Ref Range   WBC 5.8 4.0 - 10.5 K/uL   RBC 3.95 3.87 - 5.11 MIL/uL   Hemoglobin 11.6 (L) 12.0 - 15.0 g/dL    Comment: DELTA CHECK NOTED REPEATED TO VERIFY    HCT 35.4 (L) 36.0 - 46.0 %   MCV 89.6 78.0 - 100.0 fL   MCH 29.4 26.0 - 34.0 pg   MCHC 32.8 30.0 - 36.0 g/dL   RDW 17.5 (H) 11.5 - 15.5 %   Platelets 252 150 - 400 K/uL  Basic metabolic panel     Status: Abnormal   Collection Time: 06/06/15  3:44 AM  Result Value Ref Range   Sodium 145 135 - 145 mmol/L   Potassium 4.0 3.5 - 5.1 mmol/L    Comment: DELTA CHECK NOTED   Chloride 114 (H) 101 - 111 mmol/L   CO2 23 22 - 32 mmol/L   Glucose, Bld 98 65 - 99 mg/dL   BUN 19 6 - 20 mg/dL   Creatinine, Ser 1.31 (H) 0.44 - 1.00 mg/dL   Calcium 8.3 (L) 8.9 - 10.3 mg/dL   GFR calc non Af Amer 48 (L) >60 mL/min   GFR calc Af Amer 56 (L) >60 mL/min    Comment: (NOTE) The eGFR has been calculated using the CKD EPI equation. This calculation has not been validated in all clinical situations. eGFR's persistently <60 mL/min signify possible Chronic Kidney Disease.    Anion gap 8 5 - 15  Glucose, capillary     Status: None   Collection Time: 06/06/15  4:03 AM  Result Value Ref Range   Glucose-Capillary 88 65 - 99 mg/dL   Comment 1 Capillary Specimen    Comment 2 Notify RN    Comment 3 Document in Chart   Glucose, capillary     Status: Abnormal   Collection Time: 06/06/15  7:19 AM  Result Value Ref Range   Glucose-Capillary 112 (H) 65 - 99 mg/dL   Comment 1 Capillary Specimen    Comment 2 Notify RN   Glucose, capillary     Status:  Abnormal   Collection Time: 06/06/15 11:27 AM  Result Value Ref Range   Glucose-Capillary 102 (H) 65 - 99 mg/dL   Comment 1 Capillary Specimen    Comment 2 Notify  RN   Glucose, capillary     Status: Abnormal   Collection Time: 06/06/15  3:07 PM  Result Value Ref Range   Glucose-Capillary 106 (H) 65 - 99 mg/dL  Glucose, capillary     Status: None   Collection Time: 06/06/15  7:28 PM  Result Value Ref Range   Glucose-Capillary 90 65 - 99 mg/dL   Comment 1 Capillary Specimen    Comment 2 Notify RN    Comment 3 Document in Chart   Glucose, capillary     Status: None   Collection Time: 06/06/15 11:38 PM  Result Value Ref Range   Glucose-Capillary 94 65 - 99 mg/dL   Comment 1 Capillary Specimen    Comment 2 Notify RN    Comment 3 Document in Chart   Glucose, capillary     Status: None   Collection Time: 06/07/15  3:59 AM  Result Value Ref Range   Glucose-Capillary 96 65 - 99 mg/dL   Comment 1 Capillary Specimen    Comment 2 Notify RN    Comment 3 Document in Chart   Glucose, capillary     Status: Abnormal   Collection Time: 06/07/15  7:10 AM  Result Value Ref Range   Glucose-Capillary 108 (H) 65 - 99 mg/dL   Comment 1 Capillary Specimen     Current Facility-Administered Medications  Medication Dose Route Frequency Provider Last Rate Last Dose  . acetaminophen (TYLENOL) tablet 650 mg  650 mg Oral Q6H PRN Erroll Luna, MD       Or  . acetaminophen (TYLENOL) suppository 650 mg  650 mg Rectal Q6H PRN Erroll Luna, MD      . antiseptic oral rinse (CPC / CETYLPYRIDINIUM CHLORIDE 0.05%) solution 7 mL  7 mL Mouth Rinse q12n4p Erroll Luna, MD   7 mL at 06/06/15 1600  . cefTRIAXone (ROCEPHIN) 1 g in dextrose 5 % 50 mL IVPB  1 g Intravenous Q24H Romona Curls, RPH   1 g at 06/07/15 1017  . chlorhexidine (PERIDEX) 0.12 % solution 15 mL  15 mL Mouth Rinse BID Erroll Luna, MD   15 mL at 06/07/15 1017  . dextrose 5 %-0.9 % sodium chloride infusion   Intravenous Continuous Nat Christen, PA-C 125 mL/hr at 06/07/15 0800    . diphenhydrAMINE (BENADRYL) injection 12.5 mg  12.5 mg Intravenous Q6H PRN Erroll Luna, MD   12.5 mg at 06/05/15 2209   Or  . diphenhydrAMINE (BENADRYL) 12.5 MG/5ML elixir 12.5 mg  12.5 mg Oral Q6H PRN Erroll Luna, MD      . enoxaparin (LOVENOX) injection 40 mg  40 mg Subcutaneous Q24H Erroll Luna, MD   40 mg at 06/07/15 0817  . fluconazole (DIFLUCAN) IVPB 200 mg  200 mg Intravenous Q24H Nat Christen, PA-C   200 mg at 06/06/15 1530  . HYDROmorphone (DILAUDID) 1 mg/mL PCA injection   Intravenous 6 times per day Erroll Luna, MD      . Influenza vac split quadrivalent PF (FLUARIX) injection 0.5 mL  0.5 mL Intramuscular Tomorrow-1000 Ralene Ok, MD      . insulin aspart (novoLOG) injection 0-15 Units  0-15 Units Subcutaneous 6 times per day Erroll Luna, MD   Stopped at 06/07/15 0800  . LORazepam (ATIVAN) injection 1 mg  1 mg Intravenous Q8H PRN Nat Christen, PA-C   1 mg at 06/06/15 0941  . methocarbamol (ROBAXIN) 1,000 mg in dextrose 5 % 50 mL IVPB  1,000 mg Intravenous Q8H PRN Jinny Blossom  Darnelle Spangle, PA-C   1,000 mg at 06/04/15 1413  . metoprolol (LOPRESSOR) injection 5 mg  5 mg Intravenous Q6H PRN Nat Christen, PA-C   5 mg at 06/06/15 1407  . metroNIDAZOLE (FLAGYL) IVPB 500 mg  500 mg Intravenous Q8H Erroll Luna, MD   500 mg at 06/07/15 1017  . naloxone [facility]) injection 0.4 mg  0.4 mg Intravenous PRN Erroll Luna, MD       And  . sodium chloride 0.9 % injection 9 mL  9 mL Intravenous PRN Erroll Luna, MD      . ondansetron (ZOFRAN-ODT) disintegrating tablet 4 mg  4 mg Oral Q6H PRN Erroll Luna, MD       Or  . ondansetron (ZOFRAN) injection 4 mg  4 mg Intravenous Q6H PRN Erroll Luna, MD      . ondansetron (ZOFRAN) injection 4 mg  4 mg Intravenous Q6H PRN Erroll Luna, MD      . pantoprazole (PROTONIX) injection 40 mg  40 mg Intravenous Q12H Nat Christen, PA-C   40 mg at 06/07/15 1017  . risperiDONE (RISPERDAL M-TABS)  disintegrating tablet 2 mg  2 mg Oral QHS Nat Christen, PA-C   2 mg at 06/05/15 2200  . ziprasidone (GEODON) injection 20 mg  20 mg Intramuscular BID Ralene Ok, MD   20 mg at 06/07/15 9179    Musculoskeletal: Strength & Muscle Tone: decreased Gait & Station: unable to stand Patient leans: N/A  Psychiatric Specialty Exam: ROS patient complaining about dry mouth, problems with trust issues and wanted to go home and also reportedly paranoid and delusional. Patient was not able to take her psychiatric home medication secondary to recent surgery. No Fever-chills, No Headache, No changes with Vision or hearing, reports vertigo No problems swallowing food or Liquids, No Chest pain, Cough or Shortness of Breath, No Abdominal pain, No Nausea or Vommitting, Bowel movements are regular, No Blood in stool or Urine, No dysuria, No new skin rashes or bruises, No new joints pains-aches,  No new weakness, tingling, numbness in any extremity, No recent weight gain or loss, No polyuria, polydypsia or polyphagia,   A full 10 point Review of Systems was done, except as stated above, all other Review of Systems were negative.  Blood pressure 113/76, pulse 115, temperature 98.9 F (37.2 C), temperature source Oral, resp. rate 30, height 5' 8"  (1.727 m), weight 111.131 kg (245 lb), last menstrual period 04/04/2015, SpO2 97 %.Body mass index is 37.26 kg/(m^2).  General Appearance: Guarded  Eye Contact::  Good  Speech:  Pressured  Volume:  Normal  Mood:  Anxious, Depressed and Irritable  Affect:  Non-Congruent, Depressed and Labile  Thought Process:  Coherent and Goal Directed  Orientation:  Full (Time, Place, and Person)  Thought Content:  Delusions, Paranoid Ideation and Rumination  Suicidal Thoughts:  No  Homicidal Thoughts:  No  Memory:  Immediate;   Fair Recent;   Fair  Judgement:  Impaired  Insight:  Lacking  Psychomotor Activity:  Restlessness  Concentration:  Fair  Recall:  Good   Fund of Knowledge:Good  Language: Good  Akathisia:  Negative  Handed:  Right  AIMS (if indicated):     Assets:  Communication Skills Desire for Improvement Leisure Time Resilience Social Support Transportation  ADL's:  Impaired  Cognition: WNL  Sleep:      Treatment Plan Summary: Daily contact with patient to assess and evaluate symptoms and progress in treatment and Medication management  We will increase Geodon  40 mg IM to twice daily for controlling bipolar psychosis Hydroxyzine 50 mg IM at bedtime for insomnia and anxiety Appreciate psychiatric consultation and follow up as clinically required Please contact [phone] or [phone] if needs further assistance  Disposition: Patient does not meet criteria for psychiatric inpatient admission. Supportive therapy provided about ongoing stressors.  Derril Franek,JANARDHAHA R. 06/07/2015 11:08 AM

## 2015-06-08 ENCOUNTER — Inpatient Hospital Stay (HOSPITAL_COMMUNITY): Payer: Medicaid Other

## 2015-06-08 LAB — GLUCOSE, CAPILLARY
GLUCOSE-CAPILLARY: 101 mg/dL — AB (ref 65–99)
GLUCOSE-CAPILLARY: 105 mg/dL — AB (ref 65–99)
Glucose-Capillary: 105 mg/dL — ABNORMAL HIGH (ref 65–99)
Glucose-Capillary: 111 mg/dL — ABNORMAL HIGH (ref 65–99)
Glucose-Capillary: 112 mg/dL — ABNORMAL HIGH (ref 65–99)

## 2015-06-08 MED ORDER — PROMETHAZINE HCL 25 MG/ML IJ SOLN
12.5000 mg | Freq: Four times a day (QID) | INTRAMUSCULAR | Status: DC | PRN
  Administered 2015-06-08 – 2015-06-13 (×2): 12.5 mg via INTRAVENOUS
  Filled 2015-06-08 (×2): qty 1

## 2015-06-08 NOTE — Progress Notes (Signed)
4 Days Post-Op  Subjective: Pt anxious  Pain well controlled Stopped drinking from commode   Objective: Vital signs in last 24 hours: Temp:  [98.7 F (37.1 C)-99.5 F (37.5 C)] 98.7 F (37.1 C) (12/03 0744) Pulse Rate:  [81-128] 114 (12/03 0800) Resp:  [20-40] 24 (12/03 0800) BP: (105-135)/(58-119) 113/81 mmHg (12/03 0800) SpO2:  [74 %-100 %] 98 % (12/03 0800)    Intake/Output from previous day: 12/02 0701 - 12/03 0700 In: 3550 [I.V.:3000; IV Piggyback:550] Out: 1920 [Urine:1900; Drains:20] Intake/Output this shift: Total I/O In: 125 [I.V.:125] Out: 250 [Urine:250]  Incision/Wound:wound packed clean  JP serous   Lab Results:   Recent Labs  06/06/15 0344  WBC 5.8  HGB 11.6*  HCT 35.4*  PLT 252   BMET  Recent Labs  06/06/15 0344  NA 145  K 4.0  CL 114*  CO2 23  GLUCOSE 98  BUN 19  CREATININE 1.31*  CALCIUM 8.3*   PT/INR No results for input(s): LABPROT, INR in the last 72 hours. ABG No results for input(s): PHART, HCO3 in the last 72 hours.  Invalid input(s): PCO2, PO2  Studies/Results: No results found.  Anti-infectives: Anti-infectives    Start     Dose/Rate Route Frequency Ordered Stop   06/06/15 1000  cefTRIAXone (ROCEPHIN) 1 g in dextrose 5 % 50 mL IVPB     1 g 100 mL/hr over 30 Minutes Intravenous Every 24 hours 06/05/15 1120     06/04/15 1500  fluconazole (DIFLUCAN) IVPB 200 mg     200 mg 100 mL/hr over 60 Minutes Intravenous Every 24 hours 06/04/15 1337     06/04/15 1000  cefTRIAXone (ROCEPHIN) 2 g in dextrose 5 % 50 mL IVPB  Status:  Discontinued     2 g 100 mL/hr over 30 Minutes Intravenous Every 24 hours 06/04/15 0802 06/05/15 1120   06/04/15 1000  metroNIDAZOLE (FLAGYL) IVPB 500 mg     500 mg 100 mL/hr over 60 Minutes Intravenous Every 8 hours 06/04/15 0802     06/04/15 0315  piperacillin-tazobactam (ZOSYN) IVPB 3.375 g     3.375 g 100 mL/hr over 30 Minutes Intravenous  Once 06/04/15 0305 06/04/15 0350       Assessment/Plan: Patient Active Problem List   Diagnosis Date Noted  . Bipolar I disorder, most recent episode mixed (HCC) 06/07/2015  . Acute gastric perforation (HCC) 06/04/2015   s/p Procedure(s): EXPLORATORY LAPAROTOMY WITH REPAIROF GASTRIC ULCER AND BIOPSY OF GASTRIC ULCER (N/A) UGI today If ok start clears  LOS: 4 days    Dashawna Delbridge A. 06/08/2015

## 2015-06-09 LAB — GLUCOSE, CAPILLARY
GLUCOSE-CAPILLARY: 113 mg/dL — AB (ref 65–99)
Glucose-Capillary: 107 mg/dL — ABNORMAL HIGH (ref 65–99)
Glucose-Capillary: 108 mg/dL — ABNORMAL HIGH (ref 65–99)
Glucose-Capillary: 110 mg/dL — ABNORMAL HIGH (ref 65–99)
Glucose-Capillary: 107 mg/dL — ABNORMAL HIGH (ref 65–99)
Glucose-Capillary: 107 mg/dL — ABNORMAL HIGH (ref 65–99)

## 2015-06-09 MED ORDER — WHITE PETROLATUM GEL
Status: AC
  Administered 2015-06-09: 0.2
  Filled 2015-06-09: qty 1

## 2015-06-09 NOTE — Progress Notes (Signed)
5 Days Post-Op  Subjective: Patient is awake and alert Tolerating ice chips - no more nausea or vomiting UGI no sign of leak Tolerating dressing changes  Objective: Vital signs in last 24 hours: Temp:  [98.7 F (37.1 C)-99.4 F (37.4 C)] 98.7 F (37.1 C) (12/04 0454) Pulse Rate:  [100-124] 100 (12/04 0700) Resp:  [20-33] 20 (12/04 0700) BP: (92-150)/(69-90) 117/72 mmHg (12/04 0700) SpO2:  [93 %-100 %] 97 % (12/04 0700)    Intake/Output from previous day: 12/03 0701 - 12/04 0700 In: 3445 [P.O.:120; I.V.:2875; IV Piggyback:450] Out: 1510 [Urine:1500; Drains:10] Intake/Output this shift:    General appearance: alert, cooperative and no distress Resp: clear to auscultation bilaterally Cardio: regular rate and rhythm, S1, S2 normal, no murmur, click, rub or gallop GI: obese, soft; tender around incision; drain with serous drainage Wound - clean; minimal drainage  Lab Results:  No results for input(s): WBC, HGB, HCT, PLT in the last 72 hours. BMET No results for input(s): NA, K, CL, CO2, GLUCOSE, BUN, CREATININE, CALCIUM in the last 72 hours. PT/INR No results for input(s): LABPROT, INR in the last 72 hours. ABG No results for input(s): PHART, HCO3 in the last 72 hours.  Invalid input(s): PCO2, PO2  Studies/Results: Dg Ugi W/water Sol Cm  06/08/2015  CLINICAL DATA:  Postop lesser curve gastric ulcer perforation status post patch. Check for gastric leak. EXAM: UPPER GI SERIES WITH KUB TECHNIQUE: After obtaining a scout radiograph a routine upper GI series was performed using thin density water-soluble contrast. Approximately 300 cc of Omnipaque 300 was administered orally. FLUOROSCOPY TIME:  If the device does not provide the exposure index: Fluoroscopy Time (in minutes and seconds):  2 minutes and 2 seconds. COMPARISON:  CT abdomen and pelvis dated 06/04/2015. FINDINGS: Scout radiograph shows a grossly nonobstructive bowel gas pattern. Surgical clips within the right abdomen.  Tubal ligation clips within the pelvis. Oral contrast was then administered and serial fluoroscopic spot images obtained of the stomach. There was adequate filling of the stomach with contrast and contrast subsequently passed into the proximal duodenum. Patient was placed in the right lateral decubitus position in order to move the contrast to the lesser curvature and gastric antrum region. There was no extraluminal contrast identified during the exam. IMPRESSION: No evidence of gastric leak.  No extraluminal contrast seen. Electronically Signed   By: Bary Richard M.D.   On: 06/08/2015 14:23    Anti-infectives: Anti-infectives    Start     Dose/Rate Route Frequency Ordered Stop   06/06/15 1000  cefTRIAXone (ROCEPHIN) 1 g in dextrose 5 % 50 mL IVPB     1 g 100 mL/hr over 30 Minutes Intravenous Every 24 hours 06/05/15 1120     06/04/15 1500  fluconazole (DIFLUCAN) IVPB 200 mg     200 mg 100 mL/hr over 60 Minutes Intravenous Every 24 hours 06/04/15 1337     06/04/15 1000  cefTRIAXone (ROCEPHIN) 2 g in dextrose 5 % 50 mL IVPB  Status:  Discontinued     2 g 100 mL/hr over 30 Minutes Intravenous Every 24 hours 06/04/15 0802 06/05/15 1120   06/04/15 1000  metroNIDAZOLE (FLAGYL) IVPB 500 mg     500 mg 100 mL/hr over 60 Minutes Intravenous Every 8 hours 06/04/15 0802     06/04/15 0315  piperacillin-tazobactam (ZOSYN) IVPB 3.375 g     3.375 g 100 mL/hr over 30 Minutes Intravenous  Once 06/04/15 0305 06/04/15 0350      Assessment/Plan: s/p Procedure(s): EXPLORATORY  LAPAROTOMY WITH REPAIROF GASTRIC ULCER AND BIOPSY OF GASTRIC ULCER (N/A) Advance diet Transfer to floor  Clear liquids today OOB to chair; ambulate with assistance Continue antibiotics   LOS: 5 days    Anita Chancy K. 06/09/2015

## 2015-06-09 NOTE — Progress Notes (Signed)
Report called to 6N; will transport pt after breakfast; will cont. To monitor.  Benjamine SpragueYates, Meklit Cotta A

## 2015-06-09 NOTE — Progress Notes (Signed)
Pt transported to 6N; belongings, SCD's, meds and chart along with transfer; pt made family aware of transfer; will cont. To monitor.  Benjamine SpragueYates, Viha Kriegel A

## 2015-06-10 LAB — GLUCOSE, CAPILLARY
GLUCOSE-CAPILLARY: 100 mg/dL — AB (ref 65–99)
GLUCOSE-CAPILLARY: 112 mg/dL — AB (ref 65–99)
GLUCOSE-CAPILLARY: 81 mg/dL (ref 65–99)
GLUCOSE-CAPILLARY: 85 mg/dL (ref 65–99)
GLUCOSE-CAPILLARY: 99 mg/dL (ref 65–99)
Glucose-Capillary: 120 mg/dL — ABNORMAL HIGH (ref 65–99)
Glucose-Capillary: 118 mg/dL — ABNORMAL HIGH (ref 65–99)

## 2015-06-10 LAB — CBC
HCT: 27 % — ABNORMAL LOW (ref 36.0–46.0)
Hemoglobin: 9.5 g/dL — ABNORMAL LOW (ref 12.0–15.0)
MCH: 29.9 pg (ref 26.0–34.0)
MCHC: 35.2 g/dL (ref 30.0–36.0)
MCV: 84.9 fL (ref 78.0–100.0)
PLATELETS: 223 10*3/uL (ref 150–400)
RBC: 3.18 MIL/uL — ABNORMAL LOW (ref 3.87–5.11)
RDW: 17.4 % — AB (ref 11.5–15.5)
WBC: 11.7 10*3/uL — ABNORMAL HIGH (ref 4.0–10.5)

## 2015-06-10 MED ORDER — HYDROMORPHONE HCL 1 MG/ML IJ SOLN
1.0000 mg | INTRAMUSCULAR | Status: DC | PRN
  Administered 2015-06-10 – 2015-06-14 (×16): 1 mg via INTRAVENOUS
  Filled 2015-06-10 (×16): qty 1

## 2015-06-10 MED ORDER — LITHIUM CARBONATE 300 MG PO CAPS
300.0000 mg | ORAL_CAPSULE | Freq: Two times a day (BID) | ORAL | Status: DC
  Administered 2015-06-10 – 2015-06-15 (×11): 300 mg via ORAL
  Filled 2015-06-10 (×12): qty 1

## 2015-06-10 MED ORDER — GABAPENTIN 100 MG PO CAPS
100.0000 mg | ORAL_CAPSULE | Freq: Two times a day (BID) | ORAL | Status: DC
  Administered 2015-06-10 – 2015-06-15 (×11): 100 mg via ORAL
  Filled 2015-06-10 (×11): qty 1

## 2015-06-10 MED ORDER — SODIUM CHLORIDE 0.9 % IJ SOLN
3.0000 mL | Freq: Two times a day (BID) | INTRAMUSCULAR | Status: DC
  Administered 2015-06-10 – 2015-06-13 (×6): 3 mL via INTRAVENOUS

## 2015-06-10 MED ORDER — POTASSIUM CHLORIDE CRYS ER 20 MEQ PO TBCR
40.0000 meq | EXTENDED_RELEASE_TABLET | Freq: Three times a day (TID) | ORAL | Status: AC
  Administered 2015-06-10 (×3): 40 meq via ORAL
  Filled 2015-06-10 (×4): qty 2

## 2015-06-10 MED ORDER — HYDROXYZINE HCL 25 MG PO TABS
50.0000 mg | ORAL_TABLET | Freq: Every day | ORAL | Status: DC
  Administered 2015-06-10 – 2015-06-14 (×5): 50 mg via ORAL
  Filled 2015-06-10 (×5): qty 2

## 2015-06-10 MED ORDER — OXYCODONE-ACETAMINOPHEN 5-325 MG PO TABS
1.0000 | ORAL_TABLET | ORAL | Status: DC | PRN
  Administered 2015-06-10 – 2015-06-14 (×16): 2 via ORAL
  Filled 2015-06-10 (×16): qty 2

## 2015-06-10 MED ORDER — ZIPRASIDONE HCL 80 MG PO CAPS
80.0000 mg | ORAL_CAPSULE | Freq: Two times a day (BID) | ORAL | Status: DC
  Administered 2015-06-10 – 2015-06-15 (×11): 80 mg via ORAL
  Filled 2015-06-10 (×14): qty 1

## 2015-06-10 MED ORDER — FAMOTIDINE 10 MG PO TABS
10.0000 mg | ORAL_TABLET | Freq: Two times a day (BID) | ORAL | Status: DC | PRN
  Administered 2015-06-10 (×2): 10 mg via ORAL
  Filled 2015-06-10 (×2): qty 1

## 2015-06-10 MED ORDER — SODIUM CHLORIDE 0.9 % IJ SOLN: 3.0000 mL | INTRAMUSCULAR | Status: DC | PRN

## 2015-06-10 NOTE — Progress Notes (Signed)
Patient ID: Anita Villarreal, female   DOB: 10/05/1970, 45 y.o.   MRN: [id number]     [facility]      [address] [place], [place] [address]    Phone: [phone] FAX: [phone]     Subjective: C/o heart burn.  BM yesterday.  K 2.6.  WBC 11.7.  Afebrile.   Objective:  Vital signs:  Filed Vitals:   06/10/15 0023 06/10/15 0100 06/10/15 0453 06/10/15 0510  BP:  111/68 123/69   Pulse:  111 112   Temp:  99.6 F (37.6 C) 99.9 F (37.7 C)   TempSrc:  Oral Oral   Resp: _0 Height:      Weight:      SpO2: 96% 95% 95% 95%       Intake/Output   Yesterday:  12/04 0701 - 12/05 0700 In: 1957.3 [P.O.:840; I.V.:917.3; IV Piggyback:200] Out: 0160 [Urine:1550; Drains:15] This shift:    I/O last 3 completed shifts: In: 3432.3 [P.O.:840; I.V.:2292.3; IV Piggyback:300] Out: 2075 [Urine:2050; Drains:25]    Physical Exam: General: Pt awake/alert/oriented x4 in no acute distress Chest: cta. No chest wall pain w good excursion CV:  Pulses intact.  Regular rhythm MS: Normal AROM mjr joints.  No obvious deformity Abdomen: Soft.  Nondistended.  Mildly tender at incisions only. Wound clean, fascia intact. JP drain with serous drainage.  No evidence of peritonitis.  No incarcerated hernias. Ext:  SCDs BLE.  No mjr edema.  No cyanosis Skin: No petechiae / purpura   Problem List:   Principal Problem:   Bipolar I disorder, most recent episode mixed [employer]) Active Problems:   Acute gastric perforation ([facility])    Results:   Labs: Results for orders placed or performed during the hospital encounter of 06/04/15 (from the past 48 hour(s))  Glucose, capillary     Status: Abnormal   Collection Time: 06/08/15  5:31 PM  Result Value Ref Range   Glucose-Capillary 111 (H) 65 - 99 mg/dL   Comment 1 Capillary Specimen   Glucose, capillary     Status: Abnormal   Collection Time: 06/08/15  8:49 PM  Result Value Ref Range    Glucose-Capillary 112 (H) 65 - 99 mg/dL   Comment 1 Capillary Specimen    Comment 2 Notify RN   Glucose, capillary     Status: Abnormal   Collection Time: 06/08/15 11:52 PM  Result Value Ref Range   Glucose-Capillary 107 (H) 65 - 99 mg/dL   Comment 1 Capillary Specimen    Comment 2 Notify RN   Glucose, capillary     Status: Abnormal   Collection Time: 06/09/15  4:51 AM  Result Value Ref Range   Glucose-Capillary 113 (H) 65 - 99 mg/dL   Comment 1 Capillary Specimen    Comment 2 Notify RN   Glucose, capillary     Status: Abnormal   Collection Time: 06/09/15  8:17 AM  Result Value Ref Range   Glucose-Capillary 108 (H) 65 - 99 mg/dL   Comment 1 Capillary Specimen   Glucose, capillary     Status: Abnormal   Collection Time: 06/09/15 12:25 PM  Result Value Ref Range   Glucose-Capillary 110 (H) 65 - 99 mg/dL  Glucose, capillary     Status: Abnormal   Collection Time: 06/09/15  5:11 PM  Result Value Ref Range   Glucose-Capillary 107 (H) 65 - 99 mg/dL  Glucose, capillary     Status: Abnormal   Collection  Time: 06/09/15  7:59 PM  Result Value Ref Range   Glucose-Capillary 107 (H) 65 - 99 mg/dL  Glucose, capillary     Status: Abnormal   Collection Time: 06/09/15 11:51 PM  Result Value Ref Range   Glucose-Capillary 112 (H) 65 - 99 mg/dL   Comment 1 Notify RN   CBC     Status: Abnormal   Collection Time: 06/10/15  4:33 AM  Result Value Ref Range   WBC 11.7 (H) 4.0 - 10.5 K/uL   RBC 3.18 (L) 3.87 - 5.11 MIL/uL   Hemoglobin 9.5 (L) 12.0 - 15.0 g/dL   HCT 27.0 (L) 36.0 - 46.0 %   MCV 84.9 78.0 - 100.0 fL   MCH 29.9 26.0 - 34.0 pg   MCHC 35.2 30.0 - 36.0 g/dL   RDW 17.4 (H) 11.5 - 15.5 %   Platelets 223 150 - 400 K/uL  Basic metabolic panel     Status: Abnormal   Collection Time: 06/10/15  4:33 AM  Result Value Ref Range   Sodium 144 135 - 145 mmol/L   Potassium 2.6 (LL) 3.5 - 5.1 mmol/L    Comment: UNABLE TO CONTACT RN FOR THIS PATIENT ATTEMPTED TO CALL  [phone] 06/10/2015 0641 JORDANS    Chloride 113 (H) 101 - 111 mmol/L   CO2 21 (L) 22 - 32 mmol/L   Glucose, Bld 126 (H) 65 - 99 mg/dL   BUN <5 (L) 6 - 20 mg/dL   Creatinine, Ser 0.98 0.44 - 1.00 mg/dL   Calcium 7.5 (L) 8.9 - 10.3 mg/dL   GFR calc non Af Amer >60 >60 mL/min   GFR calc Af Amer >60 >60 mL/min    Comment: (NOTE) The eGFR has been calculated using the CKD EPI equation. This calculation has not been validated in all clinical situations. eGFR's persistently <60 mL/min signify possible Chronic Kidney Disease.    Anion gap 10 5 - 15  Glucose, capillary     Status: Abnormal   Collection Time: 06/10/15  4:50 AM  Result Value Ref Range   Glucose-Capillary 120 (H) 65 - 99 mg/dL   Comment 1 Notify RN   Glucose, capillary     Status: Abnormal   Collection Time: 06/10/15  7:49 AM  Result Value Ref Range   Glucose-Capillary 118 (H) 65 - 99 mg/dL    Imaging / Studies: Dg Ugi W/water Sol Cm  06/08/2015  CLINICAL DATA:  Postop lesser curve gastric ulcer perforation status post patch. Check for gastric leak. EXAM: UPPER GI SERIES WITH KUB TECHNIQUE: After obtaining a scout radiograph a routine upper GI series was performed using thin density water-soluble contrast. Approximately 300 cc of Omnipaque 300 was administered orally. FLUOROSCOPY TIME:  If the device does not provide the exposure index: Fluoroscopy Time (in minutes and seconds):  2 minutes and 2 seconds. COMPARISON:  CT abdomen and pelvis dated 06/04/2015. FINDINGS: Scout radiograph shows a grossly nonobstructive bowel gas pattern. Surgical clips within the right abdomen. Tubal ligation clips within the pelvis. Oral contrast was then administered and serial fluoroscopic spot images obtained of the stomach. There was adequate filling of the stomach with contrast and contrast subsequently passed into the proximal duodenum. Patient was placed in the right lateral decubitus position in order to move the contrast to the lesser  curvature and gastric antrum region. There was no extraluminal contrast identified during the exam. IMPRESSION: No evidence of gastric leak.  No extraluminal contrast seen. Electronically Signed   By: Franki Cabot  M.D.   On: 06/08/2015 14:23    Medications / Allergies:  Scheduled Meds: . antiseptic oral rinse  7 mL Mouth Rinse q12n4p  . cefTRIAXone (ROCEPHIN)  IV  1 g Intravenous Q24H  . chlorhexidine  15 mL Mouth Rinse BID  . enoxaparin (LOVENOX) injection  40 mg Subcutaneous Q24H  . fluconazole (DIFLUCAN) IV  200 mg Intravenous Q24H  . gabapentin  100 mg Oral BID  . hydrOXYzine  50 mg Intramuscular QHS  . Influenza vac split quadrivalent PF  0.5 mL Intramuscular Tomorrow-1000  . insulin aspart  0-15 Units Subcutaneous 6 times per day  . lithium carbonate  300 mg Oral BID  . metronidazole  500 mg Intravenous Q8H  . pantoprazole (PROTONIX) IV  40 mg Intravenous Q12H  . potassium chloride  40 mEq Oral TID  . sodium chloride  3 mL Intravenous Q12H  . ziprasidone  80 mg Oral BID WC   Continuous Infusions:  PRN Meds:.acetaminophen **OR** acetaminophen, famotidine, HYDROmorphone (DILAUDID) injection, LORazepam, methocarbamol (ROBAXIN)  IV, metoprolol, ondansetron **OR** ondansetron (ZOFRAN) IV, oxyCODONE-acetaminophen, promethazine, sodium chloride  Antibiotics: Anti-infectives    Start     Dose/Rate Route Frequency Ordered Stop   06/06/15 1000  cefTRIAXone (ROCEPHIN) 1 g in dextrose 5 % 50 mL IVPB     1 g 100 mL/hr over 30 Minutes Intravenous Every 24 hours 06/05/15 1120     06/04/15 1500  fluconazole (DIFLUCAN) IVPB 200 mg     200 mg 100 mL/hr over 60 Minutes Intravenous Every 24 hours 06/04/15 1337     06/04/15 1000  cefTRIAXone (ROCEPHIN) 2 g in dextrose 5 % 50 mL IVPB  Status:  Discontinued     2 g 100 mL/hr over 30 Minutes Intravenous Every 24 hours 06/04/15 0802 06/05/15 1120   06/04/15 1000  metroNIDAZOLE (FLAGYL) IVPB 500 mg     500 mg 100 mL/hr over 60 Minutes Intravenous  Every 8 hours 06/04/15 0802     06/04/15 0315  piperacillin-tazobactam (ZOSYN) IVPB 3.375 g     3.375 g 100 mL/hr over 30 Minutes Intravenous  Once 06/04/15 0305 06/04/15 0350        Assessment/Plan POD#6 exploratory laparotomy with repair of perforated gastric ulcer---Dr. Brantley Stage -UGI negative for a leak.  Pathology negative. -continue drain, probably DC tomorrow -BID wet to dry dressing changes -ambulate, IS, pain control -BID wet to dry dressing changes Bipolar disorder-DC risperadol, resume home meds.  ID-rocephin/flagyl/diflucan D#6/7 FEN-advance diet, DC IVF, DC PCA and add PO pain meds.  Hypokalemia-give 30mq x3 doses.  Repeat labs in AM. DVT prophylaxis-SCD/lovenox Dispo-anticipate DC in AM   EErby Pian [facility] Pager [phone](7A-4:30P) For consults and floor pages call [phone](7A-4:30P)  06/10/2015 9:03 AM

## 2015-06-10 NOTE — Care Management Note (Signed)
Case Management Note  Patient Details  Name: Anita Villarreal MRN: [id number] Date of Birth: [birth year]  Subjective/Objective:                    Action/Plan:  Discussed home health and [facility] with patient . Patient confirmed face sheet information .  Mobile phone listed is no longer in use .   Phone number [phone] is activated .   Address correct.   [facility] once signed and measurements received . DME Medicaid form also needs signature .  NP aware and called RN  Expected Discharge Date:                  Expected Discharge Plan:  Home w Home Health Services  In-House Referral:     Discharge planning Services  CM Consult  Post Acute Care Choice:  Home Health Choice offered to:  Patient  DME Arranged:  Vac DME Agency:  [facility]  HH Arranged:  RN HH Agency:  [facility]  Status of Service:  In process, will continue to follow  Medicare Important Message Given:    Date Medicare IM Given:    Medicare IM give by:    Date Additional Medicare IM Given:    Additional Medicare Important Message give by:     If discussed at Long Length of Stay Meetings, dates discussed:    Additional Comments:  Anita Villarreal, Anita Bochicchio Marie, RN 06/10/2015, 11:16 AM

## 2015-06-10 NOTE — Consult Note (Signed)
[facility] Face-to-Face Psychiatry Consult   Reason for Consult:  Status post abdominal surgery and bipolar medication management Referring Physician:  CCS MD Patient Identification: Anita Villarreal MRN:  [id number] Principal Diagnosis: Bipolar I disorder, most recent episode mixed [facility]) Diagnosis:   Patient Active Problem List   Diagnosis Date Noted  . Bipolar I disorder, most recent episode mixed ([place]) [F31.60] 06/07/2015  . Acute gastric perforation (HCC) [K25.1] 06/04/2015    Total Time spent with patient: 30 minutes  Subjective:   Anita Villarreal is a 44 y.o. female patient admitted with acute abdominal pain.  HPI:  Anita Villarreal is a 44 y.o. Female seen, chart reviewed and for psychiatric consultation and evaluation of bipolar disorder and medication management. Case discussed with staff RN on the unit. Patient is admitted to the [facility] for worsening abdominal pain and then found to have acute gastric perforation and underwent surgery. Patient was not able to take her regular home medication because of gastric surgery. Patient has been highly guarded, poorly cooperative and also manipulated to asking for ice water when she knows she is supposed not to drink and her current status is nothing by mouth. Patient has been delusional, paranoid, guarded and poorly cooperative to with the providing history of psychiatric illness except saying she has been diagnosed with the bipolar disorder and taking medication from outpatient psychiatric services. Patient refused to provide details about her medication and details about the provider and family during my evaluation. Patient denies current suicidal/homicidal ideation, intention or plan. Past Psychiatric History: History of bipolar disorder and no history of [facility] admissions.  Interval history: Patient has been calm and cooperative during this evaluation. Patient appeared speaking with her husband on phone and asked him to be stay  on phone with the speaker on for the first few minutes. Patient has been reported she has been doing much better today than Friday and reportedly normal longer appear to be psychotic or delusional. Patient stated she does not remember what she was talking during last Friday. Patient did not realize how serious her medical condition at that time. Patient is also requesting to contact her outpatient psychiatrist services including psychiatric nurse provider Metta Clines. Patient denies current symptoms of depression, anxiety and has no evidence of psychosis. Patient contract for safety at this time and has not required inpatient hospitalization.  Risk to Self: Is patient at risk for suicide?: No Risk to Others:   Prior Inpatient Therapy:   Prior Outpatient Therapy:    Past Medical History:  Past Medical History  Diagnosis Date  . Bipolar 1 disorder ([place])   . 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 Cholecystectomy    . Appendectomy    . Exploratory laparotomy  06/04/15    Phillip Heal patch repair of gastric ulcer  . Laparotomy N/A 06/04/2015    Procedure: EXPLORATORY LAPAROTOMY WITH REPAIROF GASTRIC ULCER AND BIOPSY OF GASTRIC ULCER;  Surgeon: Erroll Luna, MD;  Location: [facility];  Service: General;  Laterality: N/A;   Family History: History reviewed. No pertinent family history. Family Psychiatric  History:significant for bipolar disorder and her uncle Social History:  History  Alcohol Use  . Yes     History  Drug Use Not on file    Social History   Social History  . Marital Status: Single    Spouse Name: N/A  . Number of Children: N/A  . Years of Education: N/A   Social  History Main Topics  . Smoking status: Current Every Day Smoker  . Smokeless tobacco: None  . Alcohol Use: Yes  . Drug Use: None  . Sexual Activity: Not Asked   Other Topics Concern  . None   Social History Narrative   Additional Social History:                           Allergies:   Allergies  Allergen Reactions  . Erythromycin Other (See Comments)    Tongue swelling   . Sulfa Antibiotics     swelling    Labs:  Results for orders placed or performed during the hospital encounter of 06/04/15 (from the past 48 hour(s))  Glucose, capillary     Status: Abnormal   Collection Time: 06/08/15  5:31 PM  Result Value Ref Range   Glucose-Capillary 111 (H) 65 - 99 mg/dL   Comment 1 Capillary Specimen   Glucose, capillary     Status: Abnormal   Collection Time: 06/08/15  8:49 PM  Result Value Ref Range   Glucose-Capillary 112 (H) 65 - 99 mg/dL   Comment 1 Capillary Specimen    Comment 2 Notify RN   Glucose, capillary     Status: Abnormal   Collection Time: 06/08/15 11:52 PM  Result Value Ref Range   Glucose-Capillary 107 (H) 65 - 99 mg/dL   Comment 1 Capillary Specimen    Comment 2 Notify RN   Glucose, capillary     Status: Abnormal   Collection Time: 06/09/15  4:51 AM  Result Value Ref Range   Glucose-Capillary 113 (H) 65 - 99 mg/dL   Comment 1 Capillary Specimen    Comment 2 Notify RN   Glucose, capillary     Status: Abnormal   Collection Time: 06/09/15  8:17 AM  Result Value Ref Range   Glucose-Capillary 108 (H) 65 - 99 mg/dL   Comment 1 Capillary Specimen   Glucose, capillary     Status: Abnormal   Collection Time: 06/09/15 12:25 PM  Result Value Ref Range   Glucose-Capillary 110 (H) 65 - 99 mg/dL  Glucose, capillary     Status: Abnormal   Collection Time: 06/09/15  5:11 PM  Result Value Ref Range   Glucose-Capillary 107 (H) 65 - 99 mg/dL  Glucose, capillary     Status: Abnormal   Collection Time: 06/09/15  7:59 PM  Result Value Ref Range   Glucose-Capillary 107 (H) 65 - 99 mg/dL  Glucose, capillary     Status: Abnormal   Collection Time: 06/09/15 11:51 PM  Result Value Ref Range   Glucose-Capillary 112 (H) 65 - 99 mg/dL   Comment 1 Notify RN   CBC     Status: Abnormal   Collection Time: 06/10/15  4:33 AM   Result Value Ref Range   WBC 11.7 (H) 4.0 - 10.5 K/uL   RBC 3.18 (L) 3.87 - 5.11 MIL/uL   Hemoglobin 9.5 (L) 12.0 - 15.0 g/dL   HCT 27.0 (L) 36.0 - 46.0 %   MCV 84.9 78.0 - 100.0 fL   MCH 29.9 26.0 - 34.0 pg   MCHC 35.2 30.0 - 36.0 g/dL   RDW 17.4 (H) 11.5 - 15.5 %   Platelets 223 150 - 400 K/uL  Basic metabolic panel     Status: Abnormal   Collection Time: 06/10/15  4:33 AM  Result Value Ref Range   Sodium 144 135 - 145 mmol/L   Potassium 2.6 (LL)  3.5 - 5.1 mmol/L    Comment: UNABLE TO CONTACT RN FOR THIS PATIENT ATTEMPTED TO CALL [phone] 06/10/2015 0641 JORDANS    Chloride 113 (H) 101 - 111 mmol/L   CO2 21 (L) 22 - 32 mmol/L   Glucose, Bld 126 (H) 65 - 99 mg/dL   BUN <5 (L) 6 - 20 mg/dL   Creatinine, Ser 0.98 0.44 - 1.00 mg/dL   Calcium 7.5 (L) 8.9 - 10.3 mg/dL   GFR calc non Af Amer >60 >60 mL/min   GFR calc Af Amer >60 >60 mL/min    Comment: (NOTE) The eGFR has been calculated using the CKD EPI equation. This calculation has not been validated in all clinical situations. eGFR's persistently <60 mL/min signify possible Chronic Kidney Disease.    Anion gap 10 5 - 15  Glucose, capillary     Status: Abnormal   Collection Time: 06/10/15  4:50 AM  Result Value Ref Range   Glucose-Capillary 120 (H) 65 - 99 mg/dL   Comment 1 Notify RN   Glucose, capillary     Status: Abnormal   Collection Time: 06/10/15  7:49 AM  Result Value Ref Range   Glucose-Capillary 118 (H) 65 - 99 mg/dL    Current Facility-Administered Medications  Medication Dose Route Frequency Provider Last Rate Last Dose  . acetaminophen (TYLENOL) tablet 650 mg  650 mg Oral Q6H PRN Erroll Luna, MD       Or  . acetaminophen (TYLENOL) suppository 650 mg  650 mg Rectal Q6H PRN Erroll Luna, MD      . antiseptic oral rinse (CPC / CETYLPYRIDINIUM CHLORIDE 0.05%) solution 7 mL  7 mL Mouth Rinse q12n4p Erroll Luna, MD   7 mL at 06/08/15 1600  . cefTRIAXone (ROCEPHIN) 1 g in dextrose 5 % 50 mL IVPB   1 g Intravenous Q24H Romona Curls, RPH   1 g at 06/10/15 1007  . chlorhexidine (PERIDEX) 0.12 % solution 15 mL  15 mL Mouth Rinse BID Erroll Luna, MD   15 mL at 06/10/15 1005  . enoxaparin (LOVENOX) injection 40 mg  40 mg Subcutaneous Q24H Erroll Luna, MD   40 mg at 06/10/15 3818  . famotidine (PEPCID) tablet 10 mg  10 mg Oral BID PRN Erby Pian, NP   10 mg at 06/10/15 0958  . fluconazole (DIFLUCAN) IVPB 200 mg  200 mg Intravenous Q24H Nat Christen, PA-C   200 mg at 06/09/15 1719  . gabapentin (NEURONTIN) capsule 100 mg  100 mg Oral BID Emina Riebock, NP   100 mg at 06/10/15 1005  . HYDROmorphone (DILAUDID) injection 1 mg  1 mg Intravenous Q3H PRN Emina Riebock, NP      . hydrOXYzine (VISTARIL) injection 50 mg  50 mg Intramuscular QHS Ambrose Finland, MD   50 mg at 06/09/15 2304  . Influenza vac split quadrivalent PF (FLUARIX) injection 0.5 mL  0.5 mL Intramuscular Tomorrow-1000 Ralene Ok, MD      . insulin aspart (novoLOG) injection 0-15 Units  0-15 Units Subcutaneous 6 times per day Erroll Luna, MD   Stopped at 06/07/15 0800  . lithium carbonate capsule 300 mg  300 mg Oral BID Emina Riebock, NP   300 mg at 06/10/15 1026  . LORazepam (ATIVAN) injection 1 mg  1 mg Intravenous Q8H PRN Nat Christen, PA-C   1 mg at 06/08/15 1700  . methocarbamol (ROBAXIN) 1,000 mg in dextrose 5 % 50 mL IVPB  1,000 mg Intravenous Q8H PRN Nat Christen,  PA-C   1,000 mg at 06/04/15 1413  . metoprolol (LOPRESSOR) injection 5 mg  5 mg Intravenous Q6H PRN Nat Christen, PA-C   5 mg at 06/06/15 1407  . metroNIDAZOLE (FLAGYL) IVPB 500 mg  500 mg Intravenous Q8H Erroll Luna, MD   500 mg at 06/10/15 0958  . ondansetron (ZOFRAN-ODT) disintegrating tablet 4 mg  4 mg Oral Q6H PRN Erroll Luna, MD       Or  . ondansetron (ZOFRAN) injection 4 mg  4 mg Intravenous Q6H PRN Erroll Luna, MD      . oxyCODONE-acetaminophen (PERCOCET/ROXICET) 5-325 MG per tablet 1-2 tablet  1-2 tablet Oral Q4H PRN Emina  Riebock, NP      . pantoprazole (PROTONIX) injection 40 mg  40 mg Intravenous Q12H Nat Christen, PA-C   40 mg at 06/10/15 1005  . potassium chloride SA (K-DUR,KLOR-CON) CR tablet 40 mEq  40 mEq Oral TID Emina Riebock, NP   40 mEq at 06/10/15 1005  . promethazine (PHENERGAN) injection 12.5 mg  12.5 mg Intravenous Q6H PRN Erroll Luna, MD   12.5 mg at 06/08/15 1635  . sodium chloride 0.9 % injection 3 mL  3 mL Intravenous Q12H Emina Riebock, NP   3 mL at 06/10/15 1019  . sodium chloride 0.9 % injection 3 mL  3 mL Intravenous PRN Emina Riebock, NP      . ziprasidone (GEODON) capsule 80 mg  80 mg Oral BID WC Emina Riebock, NP   80 mg at 06/10/15 1026    Musculoskeletal: Strength & Muscle Tone: decreased Gait & Station: unable to stand Patient leans: N/A  Psychiatric Specialty Exam: ROS  Blood pressure 123/69, pulse 112, temperature 99.9 F (37.7 C), temperature source Oral, resp. rate 28, height 5' 8"  (1.727 m), weight 111.131 kg (245 lb), last menstrual period 04/04/2015, SpO2 95 %.Body mass index is 37.26 kg/(m^2).  General Appearance: Casual  Eye Contact::  Good  Speech:  Clear and Coherent  Volume:  Normal  Mood:  Depressed  Affect:  Congruent and Depressed  Thought Process:  Coherent and Goal Directed  Orientation:  Full (Time, Place, and Person)  Thought Content:  WDL  Suicidal Thoughts:  No  Homicidal Thoughts:  No  Memory:  Immediate;   Fair Recent;   Fair  Judgement:  Good  Insight:  Good  Psychomotor Activity:  Decreased  Concentration:  Fair  Recall:  Good  Fund of Knowledge:Good  Language: Good  Akathisia:  Negative  Handed:  Right  AIMS (if indicated):     Assets:  Communication Skills Desire for Improvement Leisure Time Resilience Social Support Transportation  ADL's:  Impaired  Cognition: WNL  Sleep:      Treatment Plan Summary: Daily contact with patient to assess and evaluate symptoms and progress in treatment and Medication management  Change  Geodon 80 mg by mouth twice a day for controlling bipolar psychosis Change Hydroxyzine 50 mg by mouth at bedtime for insomnia and anxiety Restart lithium 300 mg twice daily for mood swings and monitor for the serum lithium level V days from  Appreciate psychiatric consultation andwill sign off at this time  Please contact [phone] or [phone] if needs further assistance  Disposition: Patient does not meet criteria for psychiatric inpatient admission. Supportive therapy provided about ongoing stressors.  Danese Dorsainvil,JANARDHAHA R. 06/10/2015 11:37 AM

## 2015-06-10 NOTE — Progress Notes (Signed)
Patients dressing changed. Noticed scant amount of pus drainage present in wound right above umbilicus.

## 2015-06-10 NOTE — Progress Notes (Signed)
Measurements on midline abd wound 3cm depth, 19.5cm length & 4.5 width.

## 2015-06-11 ENCOUNTER — Inpatient Hospital Stay (HOSPITAL_COMMUNITY): Payer: Medicaid Other

## 2015-06-11 LAB — BASIC METABOLIC PANEL
ANION GAP: 10 (ref 5–15)
ANION GAP: 5 (ref 5–15)
BUN: <5 mg/dL — ABNORMAL LOW (ref 6–20)
CHLORIDE: 113 mmol/L — AB (ref 101–111)
CO2: 21 mmol/L — ABNORMAL LOW (ref 22–32)
CO2: 24 mmol/L (ref 22–32)
Calcium: 7.5 mg/dL — ABNORMAL LOW (ref 8.9–10.3)
Calcium: 7.7 mg/dL — ABNORMAL LOW (ref 8.9–10.3)
Chloride: 113 mmol/L — ABNORMAL HIGH (ref 101–111)
Creatinine, Ser: 0.98 mg/dL (ref 0.44–1.00)
Creatinine, Ser: 0.89 mg/dL (ref 0.44–1.00)
GFR calc Af Amer: >60 mL/min (ref >60)
GFR calc Af Amer: >60 mL/min (ref >60)
GLUCOSE: 126 mg/dL — AB (ref 65–99)
Glucose, Bld: 108 mg/dL — ABNORMAL HIGH (ref 65–99)
POTASSIUM: 2.6 mmol/L — AB (ref 3.5–5.1)
POTASSIUM: 3.1 mmol/L — AB (ref 3.5–5.1)
SODIUM: 142 mmol/L (ref 135–145)
Sodium: 144 mmol/L (ref 135–145)

## 2015-06-11 LAB — CBC
HCT: 27.2 % — ABNORMAL LOW (ref 36.0–46.0)
HEMOGLOBIN: 9.4 g/dL — AB (ref 12.0–15.0)
MCH: 28.9 pg (ref 26.0–34.0)
MCHC: 34.6 g/dL (ref 30.0–36.0)
MCV: 83.7 fL (ref 78.0–100.0)
PLATELETS: 316 10*3/uL (ref 150–400)
RBC: 3.25 MIL/uL — AB (ref 3.87–5.11)
RDW: 17 % — ABNORMAL HIGH (ref 11.5–15.5)
WBC: 14.1 10*3/uL — AB (ref 4.0–10.5)

## 2015-06-11 LAB — GLUCOSE, CAPILLARY
GLUCOSE-CAPILLARY: 112 mg/dL — AB (ref 65–99)
GLUCOSE-CAPILLARY: 79 mg/dL (ref 65–99)
GLUCOSE-CAPILLARY: 84 mg/dL (ref 65–99)
Glucose-Capillary: 103 mg/dL — ABNORMAL HIGH (ref 65–99)
Glucose-Capillary: 119 mg/dL — ABNORMAL HIGH (ref 65–99)
Glucose-Capillary: 98 mg/dL (ref 65–99)

## 2015-06-11 LAB — MAGNESIUM: MAGNESIUM: 1.7 mg/dL (ref 1.7–2.4)

## 2015-06-11 MED ORDER — POTASSIUM CHLORIDE CRYS ER 20 MEQ PO TBCR
40.0000 meq | EXTENDED_RELEASE_TABLET | Freq: Two times a day (BID) | ORAL | Status: AC
  Administered 2015-06-11 (×2): 40 meq via ORAL
  Filled 2015-06-11 (×2): qty 2

## 2015-06-11 MED ORDER — POTASSIUM CHLORIDE IN NACL 20-0.9 MEQ/L-% IV SOLN
INTRAVENOUS | Status: DC
  Administered 2015-06-11 – 2015-06-15 (×4): via INTRAVENOUS
  Filled 2015-06-11 (×5): qty 1000

## 2015-06-11 NOTE — Progress Notes (Signed)
2po prn percocets administered for c/o pain

## 2015-06-11 NOTE — Progress Notes (Signed)
Pt is back on a full liquid diet following episode of nausea and vommitting

## 2015-06-11 NOTE — Progress Notes (Signed)
2po percocet administered for c/o pain. No episodes of nausea this pm. Will have wound vac placed by ostomy nurse tomorrow

## 2015-06-11 NOTE — Progress Notes (Signed)
Pt had an episode of vommitting after breakfast this morning

## 2015-06-11 NOTE — Consult Note (Signed)
WOC wound consult note Discussed plan of care with CCS team.  Requested to apply Vac to abd wound tomorrow.  Will apply on Wed as requested. Cammie Mcgeeawn Everitt Wenner MSN, RN, CWOCN, OrangeWCN-AP, CNS [phone]

## 2015-06-11 NOTE — Progress Notes (Signed)
Patient ID: Anita Villarreal, female   DOB: 07/20/1970, 44 y.o.   MRN: [id number]     [facility]      [address] [place], [place] [address]    Phone: [phone] FAX: [phone]     Subjective: Thirsty.  Tolerating fulls.  Passing flatus.  Ambulating.  Voiding. Temp 100.6 at 1600 12/5 WBC  11.7--->14.1k.   Objective:  Vital signs:  Filed Vitals:   06/10/15 1440 06/10/15 1600 06/10/15 2050 06/11/15 0503  BP: 142/82 140/75 126/78 118/71  Pulse: 110 117 115 108  Temp: 98.9 F (37.2 C) 100.6 F (38.1 C) 99.7 F (37.6 C) 99.1 F (37.3 C)  TempSrc: Oral Oral Oral Oral  Resp: _0 Height:      Weight:      SpO2: 97% 98% 95% 98%    Last BM Date: 06/10/15  Intake/Output   Yesterday:  12/05 0701 - 12/06 0700 In: 1083 [P.O.:1080; I.V.:3] Out: 1550 [Urine:1550] This shift:    I/O last 3 completed shifts: In: 2248 [P.O.:1740; I.V.:3] Out: 2000 [Urine:2000]    Physical Exam: General: Pt awake/alert/oriented x4 in no acute distress Chest: cta. No chest wall pain w good excursion CV: Pulses intact. Regular rhythm MS: Normal AROM mjr joints. No obvious deformity Abdomen: Soft. Nondistended. Mildly tender at incisions only. Wound clean, fascia intact. JP drain with serous drainage. No evidence of peritonitis. No incarcerated hernias. Ext: SCDs BLE. No mjr edema. No cyanosis Skin: No petechiae / purpura   Problem List:   Principal Problem:   Bipolar I disorder, most recent episode mixed [facility]) Active Problems:   Acute gastric perforation ([place])    Results:   Labs: Results for orders placed or performed during the hospital encounter of 06/04/15 (from the past 48 hour(s))  Glucose, capillary     Status: Abnormal   Collection Time: 06/09/15  8:17 AM  Result Value Ref Range   Glucose-Capillary 108 (H) 65 - 99 mg/dL   Comment 1 Capillary Specimen   Glucose, capillary     Status: Abnormal    Collection Time: 06/09/15 12:25 PM  Result Value Ref Range   Glucose-Capillary 110 (H) 65 - 99 mg/dL  Glucose, capillary     Status: Abnormal   Collection Time: 06/09/15  5:11 PM  Result Value Ref Range   Glucose-Capillary 107 (H) 65 - 99 mg/dL  Glucose, capillary     Status: Abnormal   Collection Time: 06/09/15  7:59 PM  Result Value Ref Range   Glucose-Capillary 107 (H) 65 - 99 mg/dL  Glucose, capillary     Status: Abnormal   Collection Time: 06/09/15 11:51 PM  Result Value Ref Range   Glucose-Capillary 112 (H) 65 - 99 mg/dL   Comment 1 Notify RN   CBC     Status: Abnormal   Collection Time: 06/10/15  4:33 AM  Result Value Ref Range   WBC 11.7 (H) 4.0 - 10.5 K/uL   RBC 3.18 (L) 3.87 - 5.11 MIL/uL   Hemoglobin 9.5 (L) 12.0 - 15.0 g/dL   HCT 27.0 (L) 36.0 - 46.0 %   MCV 84.9 78.0 - 100.0 fL   MCH 29.9 26.0 - 34.0 pg   MCHC 35.2 30.0 - 36.0 g/dL   RDW 17.4 (H) 11.5 - 15.5 %   Platelets 223 150 - 400 K/uL  Basic metabolic panel     Status: Abnormal   Collection Time: 06/10/15  4:33 AM  Result Value  Ref Range   Sodium 144 135 - 145 mmol/L   Potassium 2.6 (LL) 3.5 - 5.1 mmol/L    Comment: UNABLE TO CONTACT RN FOR THIS PATIENT ATTEMPTED TO CALL [phone] 06/10/2015 0641 JORDANS    Chloride 113 (H) 101 - 111 mmol/L   CO2 21 (L) 22 - 32 mmol/L   Glucose, Bld 126 (H) 65 - 99 mg/dL   BUN <5 (L) 6 - 20 mg/dL   Creatinine, Ser 0.98 0.44 - 1.00 mg/dL   Calcium 7.5 (L) 8.9 - 10.3 mg/dL   GFR calc non Af Amer >60 >60 mL/min   GFR calc Af Amer >60 >60 mL/min    Comment: (NOTE) The eGFR has been calculated using the CKD EPI equation. This calculation has not been validated in all clinical situations. eGFR's persistently <60 mL/min signify possible Chronic Kidney Disease.    Anion gap 10 5 - 15  Glucose, capillary     Status: Abnormal   Collection Time: 06/10/15  4:50 AM  Result Value Ref Range   Glucose-Capillary 120 (H) 65 - 99 mg/dL   Comment 1 Notify RN   Glucose,  capillary     Status: Abnormal   Collection Time: 06/10/15  7:49 AM  Result Value Ref Range   Glucose-Capillary 118 (H) 65 - 99 mg/dL  Glucose, capillary     Status: None   Collection Time: 06/10/15 12:42 PM  Result Value Ref Range   Glucose-Capillary 99 65 - 99 mg/dL  Glucose, capillary     Status: Abnormal   Collection Time: 06/10/15  5:41 PM  Result Value Ref Range   Glucose-Capillary 100 (H) 65 - 99 mg/dL  Glucose, capillary     Status: None   Collection Time: 06/10/15  8:49 PM  Result Value Ref Range   Glucose-Capillary 85 65 - 99 mg/dL  Glucose, capillary     Status: None   Collection Time: 06/11/15 12:23 AM  Result Value Ref Range   Glucose-Capillary 84 65 - 99 mg/dL  Glucose, capillary     Status: Abnormal   Collection Time: 06/11/15  4:03 AM  Result Value Ref Range   Glucose-Capillary 103 (H) 65 - 99 mg/dL  CBC     Status: Abnormal   Collection Time: 06/11/15  5:16 AM  Result Value Ref Range   WBC 14.1 (H) 4.0 - 10.5 K/uL   RBC 3.25 (L) 3.87 - 5.11 MIL/uL   Hemoglobin 9.4 (L) 12.0 - 15.0 g/dL   HCT 27.2 (L) 36.0 - 46.0 %   MCV 83.7 78.0 - 100.0 fL   MCH 28.9 26.0 - 34.0 pg   MCHC 34.6 30.0 - 36.0 g/dL   RDW 17.0 (H) 11.5 - 15.5 %   Platelets 316 150 - 400 K/uL  Basic metabolic panel     Status: Abnormal (Preliminary result)   Collection Time: 06/11/15  5:16 AM  Result Value Ref Range   Sodium 142 135 - 145 mmol/L   Potassium 3.1 (L) 3.5 - 5.1 mmol/L   Chloride 113 (H) 101 - 111 mmol/L   CO2 24 22 - 32 mmol/L   Glucose, Bld 108 (H) 65 - 99 mg/dL   BUN PENDING 6 - 20 mg/dL   Creatinine, Ser 0.89 0.44 - 1.00 mg/dL   Calcium 7.7 (L) 8.9 - 10.3 mg/dL   GFR calc non Af Amer >60 >60 mL/min   GFR calc Af Amer >60 >60 mL/min    Comment: (NOTE) The eGFR has been calculated using the CKD  EPI equation. This calculation has not been validated in all clinical situations. eGFR's persistently <60 mL/min signify possible Chronic Kidney Disease.    Anion gap 5 5 - 15     Imaging / Studies: No results found.  Medications / Allergies:  Scheduled Meds: . antiseptic oral rinse  7 mL Mouth Rinse q12n4p  . cefTRIAXone (ROCEPHIN)  IV  1 g Intravenous Q24H  . chlorhexidine  15 mL Mouth Rinse BID  . enoxaparin (LOVENOX) injection  40 mg Subcutaneous Q24H  . fluconazole (DIFLUCAN) IV  200 mg Intravenous Q24H  . gabapentin  100 mg Oral BID  . hydrOXYzine  50 mg Oral QHS  . Influenza vac split quadrivalent PF  0.5 mL Intramuscular Tomorrow-1000  . insulin aspart  0-15 Units Subcutaneous 6 times per day  . lithium carbonate  300 mg Oral BID  . metronidazole  500 mg Intravenous Q8H  . pantoprazole (PROTONIX) IV  40 mg Intravenous Q12H  . sodium chloride  3 mL Intravenous Q12H  . ziprasidone  80 mg Oral BID WC   Continuous Infusions:  PRN Meds:.acetaminophen **OR** acetaminophen, famotidine, HYDROmorphone (DILAUDID) injection, LORazepam, methocarbamol (ROBAXIN)  IV, metoprolol, ondansetron **OR** ondansetron (ZOFRAN) IV, oxyCODONE-acetaminophen, promethazine, sodium chloride  Antibiotics: Anti-infectives    Start     Dose/Rate Route Frequency Ordered Stop   06/06/15 1000  cefTRIAXone (ROCEPHIN) 1 g in dextrose 5 % 50 mL IVPB     1 g 100 mL/hr over 30 Minutes Intravenous Every 24 hours 06/05/15 1120     06/04/15 1500  fluconazole (DIFLUCAN) IVPB 200 mg     200 mg 100 mL/hr over 60 Minutes Intravenous Every 24 hours 06/04/15 1337     06/04/15 1000  cefTRIAXone (ROCEPHIN) 2 g in dextrose 5 % 50 mL IVPB  Status:  Discontinued     2 g 100 mL/hr over 30 Minutes Intravenous Every 24 hours 06/04/15 0802 06/05/15 1120   06/04/15 1000  metroNIDAZOLE (FLAGYL) IVPB 500 mg     500 mg 100 mL/hr over 60 Minutes Intravenous Every 8 hours 06/04/15 0802     06/04/15 0315  piperacillin-tazobactam (ZOSYN) IVPB 3.375 g     3.375 g 100 mL/hr over 30 Minutes Intravenous  Once 06/04/15 0305 06/04/15 0350        Assessment/Plan POD#7 exploratory laparotomy with repair  of perforated gastric ulcer---Dr. Brantley Stage -UGI negative for a leak. Pathology negative. -continue drain(serous) -apply wound vac  -ambulate, IS, pain control Bipolar disorder-home meds ID-rocephin/flagyl/diflucan D#7/7.  Repeat CBC in AM.  If fevers and leukocytosis persist may need a CT FEN-soft diet, PO pain meds.  Hypokalemia-replete DVT prophylaxis-SCD/lovenox Dispo-continue inpatient d/t fever and increasing WBC  Erby Pian, ANP-BC [facility] Pager [phone](7A-4:30P) For consults and floor pages call [phone](7A-4:30P)  06/11/2015 7:50 AM

## 2015-06-12 ENCOUNTER — Inpatient Hospital Stay (HOSPITAL_COMMUNITY): Payer: Medicaid Other

## 2015-06-12 ENCOUNTER — Encounter (HOSPITAL_COMMUNITY): Payer: Self-pay | Admitting: Radiology

## 2015-06-12 LAB — BASIC METABOLIC PANEL
ANION GAP: 7 (ref 5–15)
BUN: <5 mg/dL — ABNORMAL LOW (ref 6–20)
CALCIUM: 7.8 mg/dL — AB (ref 8.9–10.3)
CO2: 23 mmol/L (ref 22–32)
Chloride: 112 mmol/L — ABNORMAL HIGH (ref 101–111)
Creatinine, Ser: 0.8 mg/dL (ref 0.44–1.00)
Glucose, Bld: 115 mg/dL — ABNORMAL HIGH (ref 65–99)
POTASSIUM: 3.2 mmol/L — AB (ref 3.5–5.1)
Sodium: 142 mmol/L (ref 135–145)

## 2015-06-12 LAB — CBC
HCT: 26 % — ABNORMAL LOW (ref 36.0–46.0)
Hemoglobin: 9 g/dL — ABNORMAL LOW (ref 12.0–15.0)
MCH: 28.8 pg (ref 26.0–34.0)
MCHC: 34.6 g/dL (ref 30.0–36.0)
MCV: 83.3 fL (ref 78.0–100.0)
PLATELETS: 395 10*3/uL (ref 150–400)
RBC: 3.12 MIL/uL — AB (ref 3.87–5.11)
RDW: 16.9 % — AB (ref 11.5–15.5)
WBC: 17.8 10*3/uL — AB (ref 4.0–10.5)

## 2015-06-12 LAB — URINALYSIS, ROUTINE W REFLEX MICROSCOPIC
Bilirubin Urine: NEGATIVE
Glucose, UA: NEGATIVE mg/dL
HGB URINE DIPSTICK: NEGATIVE
Ketones, ur: NEGATIVE mg/dL
Leukocytes, UA: NEGATIVE
NITRITE: NEGATIVE
PROTEIN: NEGATIVE mg/dL
SPECIFIC GRAVITY, URINE: 1.015 (ref 1.005–1.030)
pH: 6 (ref 5.0–8.0)

## 2015-06-12 LAB — GLUCOSE, CAPILLARY
GLUCOSE-CAPILLARY: 106 mg/dL — AB (ref 65–99)
GLUCOSE-CAPILLARY: 116 mg/dL — AB (ref 65–99)
GLUCOSE-CAPILLARY: 79 mg/dL (ref 65–99)
GLUCOSE-CAPILLARY: 98 mg/dL (ref 65–99)
GLUCOSE-CAPILLARY: 99 mg/dL (ref 65–99)
Glucose-Capillary: 92 mg/dL (ref 65–99)

## 2015-06-12 MED ORDER — POTASSIUM CHLORIDE CRYS ER 20 MEQ PO TBCR
40.0000 meq | EXTENDED_RELEASE_TABLET | Freq: Two times a day (BID) | ORAL | Status: AC
  Administered 2015-06-12 (×2): 40 meq via ORAL
  Filled 2015-06-12 (×2): qty 2

## 2015-06-12 MED ORDER — IOHEXOL 300 MG/ML  SOLN
25.0000 mL | INTRAMUSCULAR | Status: AC
  Administered 2015-06-12: 25 mL via ORAL

## 2015-06-12 MED ORDER — IOHEXOL 300 MG/ML  SOLN
100.0000 mL | Freq: Once | INTRAMUSCULAR | Status: AC | PRN
  Administered 2015-06-12: 100 mL via INTRAVENOUS

## 2015-06-12 NOTE — Progress Notes (Signed)
Patient ID: Anita Villarreal, female   DOB: Aug 29, 1970, 44 y.o.   MRN: [id number] 8 Days Post-Op  Subjective: Pt tearful when I walked in as she just had her VAC placed which was painful.  Eating ok, but not much appetite.  No nausea.  Had a BM yesterday.  No dysuria, but some frequency and urgency  Objective: Vital signs in last 24 hours: Temp:  [98.4 F (36.9 C)-99.5 F (37.5 C)] 98.4 F (36.9 C) (12/07 0601) Pulse Rate:  [95-108] 103 (12/07 0601) Resp:  [16-18] 16 (12/07 0601) BP: (110-123)/(63-75) 110/63 mmHg (12/07 0601) SpO2:  [98 %] 98 % (12/07 0601) Last BM Date: 06/10/15  Intake/Output from previous day: 12/06 0701 - 12/07 0700 In: 1561.3 [P.O.:720; I.V.:841.3] Out: 1900 [Urine:1900] Intake/Output this shift: Total I/O In: -  Out: 750 [Urine:750]  PE: Abd: soft, appropriately tender, +BS, VAC in place, drain with serous output Heart: regular Lungs: CTAB  Lab Results:   Recent Labs  06/11/15 0516 06/12/15 0551  WBC 14.1* 17.8*  HGB 9.4* 9.0*  HCT 27.2* 26.0*  PLT 316 395   BMET  Recent Labs  06/11/15 0516 06/12/15 0551  NA 142 142  K 3.1* 3.2*  CL 113* 112*  CO2 24 23  GLUCOSE 108* 115*  BUN <5* <5*  CREATININE 0.89 0.80  CALCIUM 7.7* 7.8*   PT/INR No results for input(s): LABPROT, INR in the last 72 hours. CMP     Component Value Date/Time   NA 142 06/12/2015 0551   K 3.2* 06/12/2015 0551   CL 112* 06/12/2015 0551   CO2 23 06/12/2015 0551   GLUCOSE 115* 06/12/2015 0551   BUN <5* 06/12/2015 0551   CREATININE 0.80 06/12/2015 0551   CALCIUM 7.8* 06/12/2015 0551   PROT 7.0 06/04/2015 0254   ALBUMIN 3.5 06/04/2015 0254   AST 24 06/04/2015 0254   ALT 14 06/04/2015 0254   ALKPHOS 99 06/04/2015 0254   BILITOT 0.3 06/04/2015 0254   GFRNONAA >60 06/12/2015 0551   GFRAA >60 06/12/2015 0551   Lipase     Component Value Date/Time   LIPASE 33 06/04/2015 0254       Studies/Results: Dg Abd Portable 1v  06/11/2015  CLINICAL DATA:  Post  abdominal surgery.  Distended abdomen. EXAM: PORTABLE ABDOMEN - 1 VIEW COMPARISON:  None. FINDINGS: Contrast noted within the colon. Nonobstructive bowel gas pattern. Surgical clips in the right upper quadrant presumably from prior cholecystectomy. Bilateral tubal ligation clips in the pelvis. No visible free air. IMPRESSION: No evidence of bowel obstruction or free air. Electronically Signed   By: Charlett NoseKevin  Dover M.D.   On: 06/11/2015 15:26    Anti-infectives: Anti-infectives    Start     Dose/Rate Route Frequency Ordered Stop   06/06/15 1000  cefTRIAXone (ROCEPHIN) 1 g in dextrose 5 % 50 mL IVPB     1 g 100 mL/hr over 30 Minutes Intravenous Every 24 hours 06/05/15 1120     06/04/15 1500  fluconazole (DIFLUCAN) IVPB 200 mg     200 mg 100 mL/hr over 60 Minutes Intravenous Every 24 hours 06/04/15 1337     06/04/15 1000  cefTRIAXone (ROCEPHIN) 2 g in dextrose 5 % 50 mL IVPB  Status:  Discontinued     2 g 100 mL/hr over 30 Minutes Intravenous Every 24 hours 06/04/15 0802 06/05/15 1120   06/04/15 1000  metroNIDAZOLE (FLAGYL) IVPB 500 mg     500 mg 100 mL/hr over 60 Minutes Intravenous Every 8 hours 06/04/15 0802  06/04/15 0315  piperacillin-tazobactam (ZOSYN) IVPB 3.375 g     3.375 g 100 mL/hr over 30 Minutes Intravenous  Once 06/04/15 0305 06/04/15 0350       Assessment/Plan  POD#8 exploratory laparotomy with repair of perforated gastric ulcer---Dr. Luisa Hart -UGI negative for a leak. Pathology negative. -continue drain(serous) -apply wound vac  -ambulate, IS, pain control -CT abd/pel today due to increasing WBC to rule out complication Bipolar disorder-home meds ID-rocephin/flagyl/diflucan D#7/7, but given increasing WBC will cont for now. Repeat CBC in AM. Check UA as she is having some urinary symptoms as well. FEN-soft diet, PO pain meds. Hypokalemia-replete DVT prophylaxis-SCD/lovenox Dispo-continue inpatient     LOS: 8 days    Laddie Naeem E 06/12/2015, 9:30 AM Pager:  [phone]

## 2015-06-12 NOTE — Consult Note (Addendum)
WOC wound consult note Reason for Consult: Consult requested to apply Vac dressing to abd wound, CCS following for assessment and plan of care. Wound type: Full thickness post-op wound Measurement: 16X5X3cm Wound bed: Beefy red Drainage (amount, consistency, odor) small amt pink drainage Periwound: Intact skin surrounding Dressing procedure/placement/frequency: Applied one piece black foam to 125mm cont suction.  Pt medicated for pain prior to procedure but it was still painful.  Plan for bedside nurse to change Q M/W/F Please re-consult if further assistance is needed.  Thank-you,  Cammie Mcgeeawn Chan Sheahan MSN, RN, CWOCN, [place] Top[place]CN-AP, CNS [phone]

## 2015-06-12 NOTE — Consult Note (Signed)
[facility] Face-to-Face Psychiatry Consult   Reason for Consult:  Status post abdominal surgery and bipolar medication management Referring Physician:  CCS MD Patient Identification: Anita Villarreal MRN:  [id number] Principal Diagnosis: Bipolar I disorder, most recent episode mixed [facility]) Diagnosis:   Patient Active Problem List   Diagnosis Date Noted  . Bipolar I disorder, most recent episode mixed ([place]) [F31.60] 06/07/2015  . Acute gastric perforation ([facility]) [K25.1] 06/04/2015    Total Time spent with patient: 30 minutes  Subjective:   Anita Villarreal is a 44 y.o. female patient admitted with acute abdominal pain.  HPI:  Anita Villarreal is a 44 y.o. Female seen, chart reviewed and for psychiatric consultation and evaluation of bipolar disorder and medication management. Case discussed with staff RN on the unit. Patient is admitted to the [facility] for worsening abdominal pain and then found to have acute gastric perforation and underwent surgery. Patient was not able to take her regular home medication because of gastric surgery. Patient has been highly guarded, poorly cooperative and also manipulated to asking for ice water when she knows she is supposed not to drink and her current status is nothing by mouth. Patient has been delusional, paranoid, guarded and poorly cooperative to with the providing history of psychiatric illness except saying she has been diagnosed with the bipolar disorder and taking medication from outpatient psychiatric services. Patient refused to provide details about her medication and details about the provider and family during my evaluation. Patient denies current suicidal/homicidal ideation, intention or plan. Past Psychiatric History: History of bipolar disorder and no history of [facility] admissions.  06/12/2015  Interval history: Patient seen face-to-face for this evaluation and patient appeared calm and cooperative. Patient stated she has no new  complaints and has been tolerating her psychiatric medication without adverse effects. Patient is tolerating her medication and willing to check her lithium level tomorrow morning. Patient has no signs of depression, mania, anxiety, auditory/visual hallucinations, paranoid delusions. Patient is also requesting to contact her outpatient psychiatrist services including psychiatric nurse provider Metta Clines. Patient denies current symptoms of depression, anxiety and has no evidence of psychosis. Patient contract for safety at this time and has not required inpatient hospitalization.  Risk to Self: Is patient at risk for suicide?: No Risk to Others:   Prior Inpatient Therapy:   Prior Outpatient Therapy:    Past Medical History:  Past Medical History  Diagnosis Date  . Bipolar 1 disorder ([place])   . Manic depressive disorder [facility])     Past Surgical History  Procedure Laterality Date  . Cholecystectomy    . Appendectomy    . Exploratory laparotomy  06/04/15    Phillip Heal patch repair of gastric ulcer  . Laparotomy N/A 06/04/2015    Procedure: EXPLORATORY LAPAROTOMY WITH REPAIROF GASTRIC ULCER AND BIOPSY OF GASTRIC ULCER;  Surgeon: Erroll Luna, MD;  Location: [facility] OR;  Service: General;  Laterality: N/A;   Family History: History reviewed. No pertinent family history. Family Psychiatric  History:significant for bipolar disorder and her uncle Social History:  History  Alcohol Use  . Yes     History  Drug Use Not on file    Social History   Social History  . Marital Status: Single    Spouse Name: N/A  . Number of Children: N/A  . Years of Education: N/A   Social History Main Topics  . Smoking status: Current Every Day Smoker  . Smokeless tobacco: None  . Alcohol Use: Yes  . Drug Use:  None  . Sexual Activity: Not Asked   Other Topics Concern  . None   Social History Narrative   Additional Social History:                          Allergies:   Allergies  Allergen  Reactions  . Erythromycin Other (See Comments)    Tongue swelling   . Sulfa Antibiotics     swelling    Labs:  Results for orders placed or performed during the hospital encounter of 06/04/15 (from the past 48 hour(s))  Glucose, capillary     Status: None   Collection Time: 06/10/15 12:42 PM  Result Value Ref Range   Glucose-Capillary 99 65 - 99 mg/dL  Glucose, capillary     Status: Abnormal   Collection Time: 06/10/15  5:41 PM  Result Value Ref Range   Glucose-Capillary 100 (H) 65 - 99 mg/dL  Glucose, capillary     Status: None   Collection Time: 06/10/15  8:49 PM  Result Value Ref Range   Glucose-Capillary 85 65 - 99 mg/dL  Glucose, capillary     Status: None   Collection Time: 06/11/15 12:23 AM  Result Value Ref Range   Glucose-Capillary 84 65 - 99 mg/dL  Glucose, capillary     Status: Abnormal   Collection Time: 06/11/15  4:03 AM  Result Value Ref Range   Glucose-Capillary 103 (H) 65 - 99 mg/dL  CBC     Status: Abnormal   Collection Time: 06/11/15  5:16 AM  Result Value Ref Range   WBC 14.1 (H) 4.0 - 10.5 K/uL   RBC 3.25 (L) 3.87 - 5.11 MIL/uL   Hemoglobin 9.4 (L) 12.0 - 15.0 g/dL   HCT 27.2 (L) 36.0 - 46.0 %   MCV 83.7 78.0 - 100.0 fL   MCH 28.9 26.0 - 34.0 pg   MCHC 34.6 30.0 - 36.0 g/dL   RDW 17.0 (H) 11.5 - 15.5 %   Platelets 316 150 - 400 K/uL  Basic metabolic panel     Status: Abnormal   Collection Time: 06/11/15  5:16 AM  Result Value Ref Range   Sodium 142 135 - 145 mmol/L   Potassium 3.1 (L) 3.5 - 5.1 mmol/L   Chloride 113 (H) 101 - 111 mmol/L   CO2 24 22 - 32 mmol/L   Glucose, Bld 108 (H) 65 - 99 mg/dL   BUN <5 (L) 6 - 20 mg/dL   Creatinine, Ser 0.89 0.44 - 1.00 mg/dL   Calcium 7.7 (L) 8.9 - 10.3 mg/dL   GFR calc non Af Amer >60 >60 mL/min   GFR calc Af Amer >60 >60 mL/min    Comment: (NOTE) The eGFR has been calculated using the CKD EPI equation. This calculation has not been validated in all clinical situations. eGFR's persistently <60 mL/min  signify possible Chronic Kidney Disease.    Anion gap 5 5 - 15  Magnesium     Status: None   Collection Time: 06/11/15  5:16 AM  Result Value Ref Range   Magnesium 1.7 1.7 - 2.4 mg/dL  Glucose, capillary     Status: Abnormal   Collection Time: 06/11/15  7:38 AM  Result Value Ref Range   Glucose-Capillary 112 (H) 65 - 99 mg/dL   Comment 1 Repeat Test   Glucose, capillary     Status: Abnormal   Collection Time: 06/11/15 12:32 PM  Result Value Ref Range   Glucose-Capillary 119 (H) 65 -  99 mg/dL  Glucose, capillary     Status: None   Collection Time: 06/11/15  4:46 PM  Result Value Ref Range   Glucose-Capillary 79 65 - 99 mg/dL   Comment 1 Notify RN   Glucose, capillary     Status: None   Collection Time: 06/11/15  8:14 PM  Result Value Ref Range   Glucose-Capillary 98 65 - 99 mg/dL   Comment 1 Notify RN    Comment 2 Document in Chart   Glucose, capillary     Status: None   Collection Time: 06/12/15 12:05 AM  Result Value Ref Range   Glucose-Capillary 79 65 - 99 mg/dL   Comment 1 Notify RN    Comment 2 Document in Chart   Glucose, capillary     Status: Abnormal   Collection Time: 06/12/15  4:25 AM  Result Value Ref Range   Glucose-Capillary 116 (H) 65 - 99 mg/dL   Comment 1 Notify RN    Comment 2 Document in Chart   CBC     Status: Abnormal   Collection Time: 06/12/15  5:51 AM  Result Value Ref Range   WBC 17.8 (H) 4.0 - 10.5 K/uL   RBC 3.12 (L) 3.87 - 5.11 MIL/uL   Hemoglobin 9.0 (L) 12.0 - 15.0 g/dL   HCT 26.0 (L) 36.0 - 46.0 %   MCV 83.3 78.0 - 100.0 fL   MCH 28.8 26.0 - 34.0 pg   MCHC 34.6 30.0 - 36.0 g/dL   RDW 16.9 (H) 11.5 - 15.5 %   Platelets 395 150 - 400 K/uL  Basic metabolic panel     Status: Abnormal   Collection Time: 06/12/15  5:51 AM  Result Value Ref Range   Sodium 142 135 - 145 mmol/L   Potassium 3.2 (L) 3.5 - 5.1 mmol/L   Chloride 112 (H) 101 - 111 mmol/L   CO2 23 22 - 32 mmol/L   Glucose, Bld 115 (H) 65 - 99 mg/dL   BUN <5 (L) 6 - 20 mg/dL    Creatinine, Ser 0.80 0.44 - 1.00 mg/dL   Calcium 7.8 (L) 8.9 - 10.3 mg/dL   GFR calc non Af Amer >60 >60 mL/min   GFR calc Af Amer >60 >60 mL/min    Comment: (NOTE) The eGFR has been calculated using the CKD EPI equation. This calculation has not been validated in all clinical situations. eGFR's persistently <60 mL/min signify possible Chronic Kidney Disease.    Anion gap 7 5 - 15  Glucose, capillary     Status: None   Collection Time: 06/12/15  7:13 AM  Result Value Ref Range   Glucose-Capillary 98 65 - 99 mg/dL    Current Facility-Administered Medications  Medication Dose Route Frequency Provider Last Rate Last Dose  . 0.9 % NaCl with KCl 20 mEq/ L  infusion   Intravenous Continuous Erby Pian, NP   Stopped at 06/12/15 0757  . acetaminophen (TYLENOL) tablet 650 mg  650 mg Oral Q6H PRN Erroll Luna, MD       Or  . acetaminophen (TYLENOL) suppository 650 mg  650 mg Rectal Q6H PRN Erroll Luna, MD      . antiseptic oral rinse (CPC / CETYLPYRIDINIUM CHLORIDE 0.05%) solution 7 mL  7 mL Mouth Rinse q12n4p Erroll Luna, MD   7 mL at 06/10/15 1600  . cefTRIAXone (ROCEPHIN) 1 g in dextrose 5 % 50 mL IVPB  1 g Intravenous Q24H Romona Curls, RPH   1 g at 06/11/15  1007  . chlorhexidine (PERIDEX) 0.12 % solution 15 mL  15 mL Mouth Rinse BID Erroll Luna, MD   15 mL at 06/11/15 2030  . enoxaparin (LOVENOX) injection 40 mg  40 mg Subcutaneous Q24H Erroll Luna, MD   40 mg at 06/12/15 0746  . famotidine (PEPCID) tablet 10 mg  10 mg Oral BID PRN Emina Riebock, NP   10 mg at 06/10/15 1636  . fluconazole (DIFLUCAN) IVPB 200 mg  200 mg Intravenous Q24H Nat Christen, PA-C   200 mg at 06/11/15 1536  . gabapentin (NEURONTIN) capsule 100 mg  100 mg Oral BID Emina Riebock, NP   100 mg at 06/11/15 2031  . HYDROmorphone (DILAUDID) injection 1 mg  1 mg Intravenous Q3H PRN Emina Riebock, NP   1 mg at 06/12/15 0401  . hydrOXYzine (ATARAX/VISTARIL) tablet 50 mg  50 mg Oral QHS Ambrose Finland, MD   50 mg at 06/11/15 2031  . insulin aspart (novoLOG) injection 0-15 Units  0-15 Units Subcutaneous 6 times per day Erroll Luna, MD   Stopped at 06/07/15 0800  . lithium carbonate capsule 300 mg  300 mg Oral BID Emina Riebock, NP   300 mg at 06/11/15 2030  . LORazepam (ATIVAN) injection 1 mg  1 mg Intravenous Q8H PRN Nat Christen, PA-C   1 mg at 06/08/15 1700  . methocarbamol (ROBAXIN) 1,000 mg in dextrose 5 % 50 mL IVPB  1,000 mg Intravenous Q8H PRN Nat Christen, PA-C   1,000 mg at 06/04/15 1413  . metoprolol (LOPRESSOR) injection 5 mg  5 mg Intravenous Q6H PRN Nat Christen, PA-C   5 mg at 06/06/15 1407  . metroNIDAZOLE (FLAGYL) IVPB 500 mg  500 mg Intravenous Q8H Erroll Luna, MD   500 mg at 06/12/15 0233  . ondansetron (ZOFRAN-ODT) disintegrating tablet 4 mg  4 mg Oral Q6H PRN Erroll Luna, MD   4 mg at 06/10/15 1636   Or  . ondansetron (ZOFRAN) injection 4 mg  4 mg Intravenous Q6H PRN Erroll Luna, MD      . oxyCODONE-acetaminophen (PERCOCET/ROXICET) 5-325 MG per tablet 1-2 tablet  1-2 tablet Oral Q4H PRN Erby Pian, NP   2 tablet at 06/12/15 0747  . pantoprazole (PROTONIX) injection 40 mg  40 mg Intravenous Q12H Nat Christen, PA-C   40 mg at 06/11/15 2039  . promethazine (PHENERGAN) injection 12.5 mg  12.5 mg Intravenous Q6H PRN Erroll Luna, MD   12.5 mg at 06/08/15 1635  . sodium chloride 0.9 % injection 3 mL  3 mL Intravenous Q12H Emina Riebock, NP   3 mL at 06/11/15 2040  . sodium chloride 0.9 % injection 3 mL  3 mL Intravenous PRN Emina Riebock, NP      . ziprasidone (GEODON) capsule 80 mg  80 mg Oral BID WC Emina Riebock, NP   80 mg at 06/11/15 1711    Musculoskeletal: Strength & Muscle Tone: decreased Gait & Station: unable to stand Patient leans: N/A  Psychiatric Specialty Exam: ROS  Blood pressure 110/63, pulse 103, temperature 98.4 F (36.9 C), temperature source Oral, resp. rate 16, height 5' 8"  (1.727 m), weight 111.131 kg (245 lb), last  menstrual period 04/04/2015, SpO2 98 %.Body mass index is 37.26 kg/(m^2).  General Appearance: Casual  Eye Contact::  Good  Speech:  Clear and Coherent  Volume:  Normal  Mood:  Depressed  Affect:  Congruent and Depressed  Thought Process:  Coherent and Goal Directed  Orientation:  Full (  Time, Place, and Person)  Thought Content:  WDL  Suicidal Thoughts:  No  Homicidal Thoughts:  No  Memory:  Immediate;   Fair Recent;   Fair  Judgement:  Good  Insight:  Good  Psychomotor Activity:  Decreased  Concentration:  Fair  Recall:  Good  Fund of Knowledge:Good  Language: Good  Akathisia:  Negative  Handed:  Right  AIMS (if indicated):     Assets:  Communication Skills Desire for Improvement Leisure Time Resilience Social Support Transportation  ADL's:  Impaired  Cognition: WNL  Sleep:      Treatment Plan Summary: Daily contact with patient to assess and evaluate symptoms and progress in treatment and Medication management  We will check lithium level tomorrow morning for therapeutic window  Continue Geodon 80 mg by mouth twice a day for controlling bipolar psychosis Continue Hydroxyzine 50 mg by mouth at bedtime for insomnia and anxiety Continue lithium 300 mg twice daily for mood swings which can be titrated to 900 mg a day if needed clinically  Appreciate psychiatric consultation andwill sign off at this time  Please contact [phone] or [phone] if needs further assistance  Disposition: Patient does not meet criteria for psychiatric inpatient admission. Supportive therapy provided about ongoing stressors.  Valerio Pinard,JANARDHAHA R. 06/12/2015 8:27 AM

## 2015-06-13 ENCOUNTER — Inpatient Hospital Stay (HOSPITAL_COMMUNITY): Payer: Medicaid Other

## 2015-06-13 LAB — BASIC METABOLIC PANEL
Anion gap: 9 (ref 5–15)
CALCIUM: 8 mg/dL — AB (ref 8.9–10.3)
CHLORIDE: 109 mmol/L (ref 101–111)
CO2: 23 mmol/L (ref 22–32)
CREATININE: 0.77 mg/dL (ref 0.44–1.00)
GFR calc non Af Amer: >60 mL/min (ref >60)
Glucose, Bld: 92 mg/dL (ref 65–99)
Potassium: 3.2 mmol/L — ABNORMAL LOW (ref 3.5–5.1)
Sodium: 141 mmol/L (ref 135–145)

## 2015-06-13 LAB — CBC
HEMATOCRIT: 26.2 % — AB (ref 36.0–46.0)
HEMOGLOBIN: 9 g/dL — AB (ref 12.0–15.0)
MCH: 28.9 pg (ref 26.0–34.0)
MCHC: 34.4 g/dL (ref 30.0–36.0)
MCV: 84.2 fL (ref 78.0–100.0)
Platelets: 486 10*3/uL — ABNORMAL HIGH (ref 150–400)
RBC: 3.11 MIL/uL — ABNORMAL LOW (ref 3.87–5.11)
RDW: 17.1 % — AB (ref 11.5–15.5)
WBC: 19.1 10*3/uL — AB (ref 4.0–10.5)

## 2015-06-13 LAB — GLUCOSE, CAPILLARY
GLUCOSE-CAPILLARY: 83 mg/dL (ref 65–99)
GLUCOSE-CAPILLARY: 90 mg/dL (ref 65–99)
Glucose-Capillary: 85 mg/dL (ref 65–99)
Glucose-Capillary: 102 mg/dL — ABNORMAL HIGH (ref 65–99)

## 2015-06-13 LAB — LITHIUM LEVEL: Lithium Lvl: 0.27 mmol/L — ABNORMAL LOW (ref 0.60–1.20)

## 2015-06-13 MED ORDER — LEVOFLOXACIN 750 MG PO TABS
750.0000 mg | ORAL_TABLET | ORAL | Status: DC
  Administered 2015-06-13 – 2015-06-14 (×2): 750 mg via ORAL
  Filled 2015-06-13 (×2): qty 1

## 2015-06-13 MED ORDER — POTASSIUM CHLORIDE CRYS ER 20 MEQ PO TBCR
40.0000 meq | EXTENDED_RELEASE_TABLET | Freq: Three times a day (TID) | ORAL | Status: DC
  Administered 2015-06-13 (×2): 40 meq via ORAL
  Filled 2015-06-13 (×3): qty 2

## 2015-06-13 NOTE — Care Management Note (Signed)
Case Management Note  Patient Details  Name: Anita Villarreal MRN: [id number] Date of Birth: 23-Jan-1971  Subjective/Objective:                    Action/Plan: UR updated   Expected Discharge Date:                  Expected Discharge Plan:  Home w Home Health Services  In-House Referral:     Discharge planning Services  CM Consult  Post Acute Care Choice:  Home Health Choice offered to:  Patient  DME Arranged:  Vac DME Agency:  [facility]  HH Arranged:  RN HH Agency:  [facility]  Status of Service:  In process, will continue to follow  Medicare Important Message Given:    Date Medicare IM Given:    Medicare IM give by:    Date Additional Medicare IM Given:    Additional Medicare Important Message give by:     If discussed at Long Length of Stay Meetings, dates discussed:    Additional Comments:  [name], Illana Nolting Marie, RN 06/13/2015, 1:41 PM

## 2015-06-13 NOTE — Progress Notes (Signed)
Patient ID: Anita Villarreal, female   DOB: 15-Apr-1971, 44 y.o.   MRN: [id number] 9 Days Post-Op  Subjective: Pt looks better today.  Ate some of her breakfast.  Had a BM this morning, but still feels somewhat bloated.  Said mobilizing helped her bloated feeling some yesterday.  Has a slight cough with a little phlegm, but nothing significant.  Objective: Vital signs in last 24 hours: Temp:  [97.7 F (36.5 C)-98.4 F (36.9 C)] 97.7 F (36.5 C) (12/08 0506) Pulse Rate:  [94-115] 115 (12/08 0506) Resp:  [17-18] 18 (12/08 0506) BP: (115-128)/(75-89) 126/75 mmHg (12/08 0506) SpO2:  [97 %-99 %] 97 % (12/08 0506) Last BM Date: 06/13/15  Intake/Output from previous day: 12/07 0701 - 12/08 0700 In: 2600 [P.O.:1200; I.V.:1050; IV Piggyback:350] Out: 3437.5 [Urine:3400; Drains:37.5] Intake/Output this shift:    PE: Abd: soft, bloated, but with some BS, midline incision with VAC in place, JP drain with minimal clear output Heart: regular Lungs: decrease BS at base, but otherwise clear  Lab Results:   Recent Labs  06/12/15 0551 06/13/15 0445  WBC 17.8* 19.1*  HGB 9.0* 9.0*  HCT 26.0* 26.2*  PLT 395 486*   BMET  Recent Labs  06/12/15 0551 06/13/15 0445  NA 142 141  K 3.2* 3.2*  CL 112* 109  CO2 23 23  GLUCOSE 115* 92  BUN <5* <5*  CREATININE 0.80 0.77  CALCIUM 7.8* 8.0*   PT/INR No results for input(s): LABPROT, INR in the last 72 hours. CMP     Component Value Date/Time   NA 141 06/13/2015 0445   K 3.2* 06/13/2015 0445   CL 109 06/13/2015 0445   CO2 23 06/13/2015 0445   GLUCOSE 92 06/13/2015 0445   BUN <5* 06/13/2015 0445   CREATININE 0.77 06/13/2015 0445   CALCIUM 8.0* 06/13/2015 0445   PROT 7.0 06/04/2015 0254   ALBUMIN 3.5 06/04/2015 0254   AST 24 06/04/2015 0254   ALT 14 06/04/2015 0254   ALKPHOS 99 06/04/2015 0254   BILITOT 0.3 06/04/2015 0254   GFRNONAA >60 06/13/2015 0445   GFRAA >60 06/13/2015 0445   Lipase     Component Value Date/Time    LIPASE 33 06/04/2015 0254       Studies/Results: Ct Abdomen Pelvis W Contrast  06/12/2015  CLINICAL DATA:  Leukocytosis, status post laparotomy post gastric perforation EXAM: CT ABDOMEN AND PELVIS WITH CONTRAST TECHNIQUE: Multidetector CT imaging of the abdomen and pelvis was performed using the standard protocol following bolus administration of intravenous contrast. CONTRAST:  OMNIPAQUE IOHEXOL 300 MG/ML  SOLN COMPARISON:  06/04/2015 FINDINGS: There is small right pleural effusion with right lower lobe atelectasis or infiltrate. Sagittal images of the spine are unremarkable. No there is a drain in right abdomen with tip in midline upper abdomen anterior gastric region. Please see axial image 20. Small perihepatic ascites. Enhanced liver, pancreas, spleen and adrenal glands are unremarkable. Enhanced kidneys are symmetrical in size. No hydronephrosis or hydroureter. Delayed renal images shows bilateral renal symmetrical excretion. Bilateral visualized proximal ureter is unremarkable. Oral contrast material was given to the patient. Small pockets of free fluid noted within abdomen. A fluid collection just posterior to the stomach measures 6.1 by 2.5 cm. There is a elongated fluid collection in left anterior abdomen axial image 48 measures 8.6 by 2.6 cm. Air-fluid collection in right lower abdomen axial image 60 measures about 5.8 cm. Small pocket of residual free is noted in right lower abdomen axial image 50. This may be  a from prior perforation or postsurgical in nature. No definite mesenteric enhancement to suggest a definite abscess. Moderate free fluid noted in right paracolic gutter surrounding small bowel loops. Moderate free fluid noted within pelvis. There is no evidence of oral contrast material extravasation. Mild anasarca infiltration of subcutaneous fat bilateral flank and pelvic wall. The urinary bladder is under distended. There is no inguinal adenopathy. Oral contrast material noted  within descending colon and rectum. There is no evidence of colonic obstruction. Mild distended small bowel loops in right lower abdomen with some air-fluid level and probable mild ileus. Unremarkable uterus and ovaries. Post tubal ligation surgical clips are noted. IMPRESSION: 1. There is small right pleural effusion. Right lower lobe posterior atelectasis or infiltrate. Trace atelectasis left base posteriorly. 2. Small perihepatic ascites. There is a probable postsurgical drain in right abdomen with tip in the upper abdomen anterior to the stomach. There are pockets of free fluid throughout the abdomen. The largest pocket posterior to the stomach measures 6.1 x 2.5 cm. A pocket of fluid in left abdomen just anterior to left kidney measures 4.6 cm. Elongated pocket of fluid in left abdomen anteriorly measures 8.6 x 2.6 cm. Small amount of residual free air is noted left abdomen anteriorly please see axial image 50. Moderate free fluid noted right paracolic gutter. Small free fluid noted in left paracolic gutter. Moderate free fluid noted within pelvis. No definite enhancement to suggest abscesses but infection cannot be excluded. Clinical correlation is necessary 3. There is no evidence of oral contrast material extravasation. No colonic obstruction. Contrast material noted within rectum. 4. Mild distended small bowel loops in right lower quadrant with some air-fluid level probable mild ileus. No evidence of small bowel obstruction. 5. Unremarkable uterus and ovaries. Post tubal ligation surgical clips are noted. Electronically Signed   By: Natasha Mead M.D.   On: 06/12/2015 15:37   Dg Abd Portable 1v  06/11/2015  CLINICAL DATA:  Post abdominal surgery.  Distended abdomen. EXAM: PORTABLE ABDOMEN - 1 VIEW COMPARISON:  None. FINDINGS: Contrast noted within the colon. Nonobstructive bowel gas pattern. Surgical clips in the right upper quadrant presumably from prior cholecystectomy. Bilateral tubal ligation clips in  the pelvis. No visible free air. IMPRESSION: No evidence of bowel obstruction or free air. Electronically Signed   By: Charlett Nose M.D.   On: 06/11/2015 15:26    Anti-infectives: Anti-infectives    Start     Dose/Rate Route Frequency Ordered Stop   06/06/15 1000  cefTRIAXone (ROCEPHIN) 1 g in dextrose 5 % 50 mL IVPB     1 g 100 mL/hr over 30 Minutes Intravenous Every 24 hours 06/05/15 1120     06/04/15 1500  fluconazole (DIFLUCAN) IVPB 200 mg     200 mg 100 mL/hr over 60 Minutes Intravenous Every 24 hours 06/04/15 1337     06/04/15 1000  cefTRIAXone (ROCEPHIN) 2 g in dextrose 5 % 50 mL IVPB  Status:  Discontinued     2 g 100 mL/hr over 30 Minutes Intravenous Every 24 hours 06/04/15 0802 06/05/15 1120   06/04/15 1000  metroNIDAZOLE (FLAGYL) IVPB 500 mg     500 mg 100 mL/hr over 60 Minutes Intravenous Every 8 hours 06/04/15 0802     06/04/15 0315  piperacillin-tazobactam (ZOSYN) IVPB 3.375 g     3.375 g 100 mL/hr over 30 Minutes Intravenous  Once 06/04/15 0305 06/04/15 0350       Assessment/Plan POD#9 exploratory laparotomy with repair of perforated gastric ulcer---Dr. Luisa Hart -  UGI negative for a leak. Pathology negative. -continue drain(serous) -cont with wound VAC -ambulate, IS, pain control -CT abd/pel reveals abdominal ascites, but none of this appears to be infected per CT.  ? Of an infiltrate vs atelectasis of her lung. Bipolar disorder-home meds ID-rocephin/flagyl/diflucan D#8, but given increasing WBC will cont for now. Repeat CBC in AM. Check CXR today given possible finding on CT scan yesterday.  She is otherwise afebrile and does not appear ill.  Will d/w Dr. Lindie SpruceWyatt for further suggestions FEN-soft diet, PO pain meds. Hypokalemia-replete DVT prophylaxis-SCD/lovenox Dispo-continue inpatient     ADDENDUM: CXR shows bilateral infiltrates c/w PNA/  Will DC Rocephin/Flagyl/Diflucan and start levaquin.    LOS: 9 days    Anita Villarreal E 06/13/2015, 10:00 AM Pager:  [phone]

## 2015-06-14 LAB — CBC
HEMATOCRIT: 24.7 % — AB (ref 36.0–46.0)
Hemoglobin: 8.7 g/dL — ABNORMAL LOW (ref 12.0–15.0)
MCH: 29.7 pg (ref 26.0–34.0)
MCHC: 35.2 g/dL (ref 30.0–36.0)
MCV: 84.3 fL (ref 78.0–100.0)
PLATELETS: 516 10*3/uL — AB (ref 150–400)
RBC: 2.93 MIL/uL — ABNORMAL LOW (ref 3.87–5.11)
RDW: 17.2 % — AB (ref 11.5–15.5)
WBC: 16.6 10*3/uL — ABNORMAL HIGH (ref 4.0–10.5)

## 2015-06-14 LAB — BASIC METABOLIC PANEL
Anion gap: 9 (ref 5–15)
CALCIUM: 7.7 mg/dL — AB (ref 8.9–10.3)
CO2: 23 mmol/L (ref 22–32)
CREATININE: 0.69 mg/dL (ref 0.44–1.00)
Chloride: 108 mmol/L (ref 101–111)
GFR calc Af Amer: >60 mL/min (ref >60)
GFR calc non Af Amer: >60 mL/min (ref >60)
GLUCOSE: 109 mg/dL — AB (ref 65–99)
Potassium: 3.3 mmol/L — ABNORMAL LOW (ref 3.5–5.1)
Sodium: 140 mmol/L (ref 135–145)

## 2015-06-14 MED ORDER — PANTOPRAZOLE SODIUM 40 MG PO TBEC: 40.0000 mg | DELAYED_RELEASE_TABLET | Freq: Two times a day (BID) | ORAL | Status: DC

## 2015-06-14 MED ORDER — METHOCARBAMOL 500 MG PO TABS
1000.0000 mg | ORAL_TABLET | Freq: Three times a day (TID) | ORAL | Status: DC | PRN
  Administered 2015-06-14 – 2015-06-15 (×3): 1000 mg via ORAL
  Filled 2015-06-14 (×3): qty 2

## 2015-06-14 MED ORDER — OXYCODONE HCL 5 MG PO TABS
5.0000 mg | ORAL_TABLET | ORAL | Status: DC | PRN
  Administered 2015-06-14 – 2015-06-15 (×6): 15 mg via ORAL
  Filled 2015-06-14 (×6): qty 3

## 2015-06-14 MED ORDER — FAMOTIDINE 10 MG PO TABS: 10.0000 mg | ORAL_TABLET | Freq: Two times a day (BID) | ORAL | Status: DC | PRN

## 2015-06-14 MED ORDER — LORAZEPAM 0.5 MG PO TABS
0.5000 mg | ORAL_TABLET | Freq: Three times a day (TID) | ORAL | Status: DC | PRN
  Administered 2015-06-14 – 2015-06-15 (×3): 0.5 mg via ORAL
  Filled 2015-06-14 (×3): qty 1

## 2015-06-14 MED ORDER — PANTOPRAZOLE SODIUM 40 MG PO TBEC
40.0000 mg | DELAYED_RELEASE_TABLET | Freq: Two times a day (BID) | ORAL | Status: DC
  Administered 2015-06-14 – 2015-06-15 (×3): 40 mg via ORAL
  Filled 2015-06-14 (×3): qty 1

## 2015-06-14 MED ORDER — POTASSIUM CHLORIDE 10 MEQ/100ML IV SOLN
10.0000 meq | INTRAVENOUS | Status: AC
  Administered 2015-06-14 (×6): 10 meq via INTRAVENOUS
  Filled 2015-06-14: qty 100

## 2015-06-14 MED ORDER — POTASSIUM CHLORIDE CRYS ER 20 MEQ PO TBCR: 40.0000 meq | EXTENDED_RELEASE_TABLET | Freq: Three times a day (TID) | ORAL | Status: DC

## 2015-06-14 NOTE — Discharge Instructions (Signed)
[facility]      [facility] [phone]  OPEN ABDOMINAL SURGERY: POST OP INSTRUCTIONS  Always review your discharge instruction sheet given to you by the facility where your surgery was performed.  IF YOU HAVE DISABILITY OR FAMILY LEAVE FORMS, YOU MUST BRING THEM TO THE OFFICE FOR PROCESSING.  PLEASE DO NOT GIVE THEM TO YOUR DOCTOR.  1. A prescription for pain medication may be given to you upon discharge.  Take your pain medication as prescribed, if needed.  If narcotic pain medicine is not needed, then you may take acetaminophen (Tylenol) or ibuprofen (Advil) as needed. 2. Take your usually prescribed medications unless otherwise directed. 3. If you need a refill on your pain medication, please contact your pharmacy. They will contact our office to request authorization.  Prescriptions will not be filled after 5pm or on week-ends. 4. You should follow a light diet the first few days after arrival home, such as soup and crackers, pudding, etc.unless your doctor has advised otherwise. A high-fiber, low fat diet can be resumed as tolerated.   Be sure to include lots of fluids daily. Most patients will experience some swelling and bruising on the chest and neck area.  Ice packs will help.  Swelling and bruising can take several days to resolve 5. Most patients will experience some swelling and bruising in the area of the incision. Ice pack will help. Swelling and bruising can take several days to resolve..  6. It is common to experience some constipation if taking pain medication after surgery.  Increasing fluid intake and taking a stool softener will usually help or prevent this problem from occurring.  A mild laxative (Milk of Magnesia or Miralax) should be taken according to package directions if there are no bowel movements after 48 hours. 7.  You may have steri-strips (small skin tapes) in place directly over the incision.  These strips should be left on the skin for 7-10 days.  If your  surgeon used skin glue on the incision, you may shower in 24 hours.  The glue will flake off over the next 2-3 weeks.  Any sutures or staples will be removed at the office during your follow-up visit. You may find that a light gauze bandage over your incision may keep your staples from being rubbed or pulled. You may shower and replace the bandage daily. 8. ACTIVITIES:  You may resume regular (light) daily activities beginning the next day--such as daily self-care, walking, climbing stairs--gradually increasing activities as tolerated.  You may have sexual intercourse when it is comfortable.  Refrain from any heavy lifting or straining until approved by your doctor. a. You may drive when you no longer are taking prescription pain medication, you can comfortably wear a seatbelt, and you can safely maneuver your car and apply brakes b. Return to Work: ___________________________________ 9. You should see your doctor in the office for a follow-up appointment approximately two weeks after your surgery.  Make sure that you call for this appointment within a day or two after you arrive home to insure a convenient appointment time. OTHER INSTRUCTIONS:  _____________________________________________________________ _____________________________________________________________  WHEN TO CALL YOUR DOCTOR: 1. Fever over 101.0 2. Inability to urinate 3. Nausea and/or vomiting 4. Extreme swelling or bruising 5. Continued bleeding from incision. 6. Increased pain, redness, or drainage from the incision. 7. Difficulty swallowing or breathing 8. Muscle cramping or spasms. 9. Numbness or tingling in hands or feet or around lips.  The clinic staff is available to   answer your questions during regular business hours.  Please don't hesitate to call and ask to speak to one of the nurses if you have concerns.  For further questions, please visit [email or web address]   

## 2015-06-14 NOTE — Progress Notes (Signed)
[facility] Progress Note  10 Days Post-Op  Subjective: Pt doing much better.  Less pain.  Tolerating soft diet, but anorexic.  Ambulating well.  Drain with 45mL output and serous.  No N/V since discontinuing kdur, she does not tolerate that well.  Had a BM yesterday.  Wants to go home.  Denies CP/SOB.  Using IS.    Objective: Vital signs in last 24 hours: Temp:  [98.5 F (36.9 C)-99.1 F (37.3 C)] 98.5 F (36.9 C) (12/09 0527) Pulse Rate:  [104-108] 108 (12/09 0527) Resp:  [16-18] 17 (12/09 0527) BP: (115-121)/(69-80) 121/69 mmHg (12/09 0527) SpO2:  [96 %-99 %] 96 % (12/09 0527) Last BM Date: 06/13/15  Intake/Output from previous day: 12/08 0701 - 12/09 0700 In: 3374.7 [P.O.:1600; I.V.:1524.7; IV Piggyback:250] Out: 745 [Urine:700; Drains:45] Intake/Output this shift: Total I/O In: -  Out: 100 [Urine:100]  PE: Gen:  Alert, NAD, pleasant Card:  RRR, no M/G/R heard  Pulm:  Course breath sounds, good effort, IS up to 1000 Abd: Soft, NT/ND, +BS, no HSM, incisions C/D/I, drain with minimal sanguinous drainage, no abdominal scars noted   Lab Results:   Recent Labs  06/13/15 0445 06/14/15 0608  WBC 19.1* 16.6*  HGB 9.0* 8.7*  HCT 26.2* 24.7*  PLT 486* 516*   BMET  Recent Labs  06/13/15 0445 06/14/15 0608  NA 141 140  K 3.2* 3.3*  CL 109 108  CO2 23 23  GLUCOSE 92 109*  BUN <5* <5*  CREATININE 0.77 0.69  CALCIUM 8.0* 7.7*   PT/INR No results for input(s): LABPROT, INR in the last 72 hours. CMP     Component Value Date/Time   NA 140 06/14/2015 0608   K 3.3* 06/14/2015 0608   CL 108 06/14/2015 0608   CO2 23 06/14/2015 0608   GLUCOSE 109* 06/14/2015 0608   BUN <5* 06/14/2015 0608   CREATININE 0.69 06/14/2015 0608   CALCIUM 7.7* 06/14/2015 0608   PROT 7.0 06/04/2015 0254   ALBUMIN 3.5 06/04/2015 0254   AST 24 06/04/2015 0254   ALT 14 06/04/2015 0254   ALKPHOS 99 06/04/2015 0254   BILITOT 0.3 06/04/2015 0254   GFRNONAA >60 06/14/2015  0608   GFRAA >60 06/14/2015 0608   Lipase     Component Value Date/Time   LIPASE 33 06/04/2015 0254       Studies/Results: Dg Chest 2 View  06/13/2015  CLINICAL DATA:  Elevated white blood count, fever today, surgery 2 days ago EXAM: CHEST  2 VIEW COMPARISON:  05/21/2012 FINDINGS: Heart size normal. Limited inspiratory effect. Bilateral lower lobe infiltrates. Small right effusion. IMPRESSION: Bilateral lower lobe infiltrates concerning for pneumonia or pneumonitis. Electronically Signed   By: Esperanza Heir M.D.   On: 06/13/2015 11:00   Ct Abdomen Pelvis W Contrast  06/12/2015  CLINICAL DATA:  Leukocytosis, status post laparotomy post gastric perforation EXAM: CT ABDOMEN AND PELVIS WITH CONTRAST TECHNIQUE: Multidetector CT imaging of the abdomen and pelvis was performed using the standard protocol following bolus administration of intravenous contrast. CONTRAST:  OMNIPAQUE IOHEXOL 300 MG/ML  SOLN COMPARISON:  06/04/2015 FINDINGS: There is small right pleural effusion with right lower lobe atelectasis or infiltrate. Sagittal images of the spine are unremarkable. No there is a drain in right abdomen with tip in midline upper abdomen anterior gastric region. Please see axial image 20. Small perihepatic ascites. Enhanced liver, pancreas, spleen and adrenal glands are unremarkable. Enhanced kidneys are symmetrical in size. No hydronephrosis or hydroureter. Delayed renal images  shows bilateral renal symmetrical excretion. Bilateral visualized proximal ureter is unremarkable. Oral contrast material was given to the patient. Small pockets of free fluid noted within abdomen. A fluid collection just posterior to the stomach measures 6.1 by 2.5 cm. There is a elongated fluid collection in left anterior abdomen axial image 48 measures 8.6 by 2.6 cm. Air-fluid collection in right lower abdomen axial image 60 measures about 5.8 cm. Small pocket of residual free is noted in right lower abdomen axial image  50. This may be a from prior perforation or postsurgical in nature. No definite mesenteric enhancement to suggest a definite abscess. Moderate free fluid noted in right paracolic gutter surrounding small bowel loops. Moderate free fluid noted within pelvis. There is no evidence of oral contrast material extravasation. Mild anasarca infiltration of subcutaneous fat bilateral flank and pelvic wall. The urinary bladder is under distended. There is no inguinal adenopathy. Oral contrast material noted within descending colon and rectum. There is no evidence of colonic obstruction. Mild distended small bowel loops in right lower abdomen with some air-fluid level and probable mild ileus. Unremarkable uterus and ovaries. Post tubal ligation surgical clips are noted. IMPRESSION: 1. There is small right pleural effusion. Right lower lobe posterior atelectasis or infiltrate. Trace atelectasis left base posteriorly. 2. Small perihepatic ascites. There is a probable postsurgical drain in right abdomen with tip in the upper abdomen anterior to the stomach. There are pockets of free fluid throughout the abdomen. The largest pocket posterior to the stomach measures 6.1 x 2.5 cm. A pocket of fluid in left abdomen just anterior to left kidney measures 4.6 cm. Elongated pocket of fluid in left abdomen anteriorly measures 8.6 x 2.6 cm. Small amount of residual free air is noted left abdomen anteriorly please see axial image 50. Moderate free fluid noted right paracolic gutter. Small free fluid noted in left paracolic gutter. Moderate free fluid noted within pelvis. No definite enhancement to suggest abscesses but infection cannot be excluded. Clinical correlation is necessary 3. There is no evidence of oral contrast material extravasation. No colonic obstruction. Contrast material noted within rectum. 4. Mild distended small bowel loops in right lower quadrant with some air-fluid level probable mild ileus. No evidence of small bowel  obstruction. 5. Unremarkable uterus and ovaries. Post tubal ligation surgical clips are noted. Electronically Signed   By: Natasha Mead M.D.   On: 06/12/2015 15:37    Anti-infectives: Anti-infectives    Start     Dose/Rate Route Frequency Ordered Stop   06/13/15 1430  levofloxacin (LEVAQUIN) tablet 750 mg     750 mg Oral Every 24 hours 06/13/15 1403     06/06/15 1000  cefTRIAXone (ROCEPHIN) 1 g in dextrose 5 % 50 mL IVPB  Status:  Discontinued     1 g 100 mL/hr over 30 Minutes Intravenous Every 24 hours 06/05/15 1120 06/13/15 1403   06/04/15 1500  fluconazole (DIFLUCAN) IVPB 200 mg  Status:  Discontinued     200 mg 100 mL/hr over 60 Minutes Intravenous Every 24 hours 06/04/15 1337 06/13/15 1403   06/04/15 1000  cefTRIAXone (ROCEPHIN) 2 g in dextrose 5 % 50 mL IVPB  Status:  Discontinued     2 g 100 mL/hr over 30 Minutes Intravenous Every 24 hours 06/04/15 0802 06/05/15 1120   06/04/15 1000  metroNIDAZOLE (FLAGYL) IVPB 500 mg  Status:  Discontinued     500 mg 100 mL/hr over 60 Minutes Intravenous Every 8 hours 06/04/15 0802 06/13/15 1403   06/04/15  0315  piperacillin-tazobactam (ZOSYN) IVPB 3.375 g     3.375 g 100 mL/hr over 30 Minutes Intravenous  Once 06/04/15 0305 06/04/15 0350       Assessment/Plan POD #10 exploratory laparotomy with repair of perforated gastric ulcer---Dr. Luisa Hartornett -UGI negative for a leak. Pathology negative. -continue drain (serous) ?d/c at discharge? -cont with wound VAC at discharge -ambulate, IS, pain control -CT abd/pel reveals abdominal ascites, but none of this appears to be infected per CT.  -Switched protonix and robaxin to PO  Bipolar disorder - home meds ID - rocephin/flagyl/diflucan, now discontinued after 8-9 days.   Repeat WBC today shows 16.6.  CXR shows b/l lower lobe infiltrates - now on Levaquin Day #2.  She is otherwise afebrile and does not appear ill, but tachycardic FEN - soft diet, D/c IV pain meds, PO pain meds only.  Hypokalemia -  supplemented again today via IV since she can not tolerate PO DVT prophylaxis - SCD/lovenox Dispo - continue inpatient, would like to see her WBC improve and tachycardia improve before discharge    LOS: 10 days    Nonie HoyerMegan N Caniyah Murley 06/14/2015, 8:40 AM Pager: [phone]

## 2015-06-15 LAB — CBC
HEMATOCRIT: 24.6 % — AB (ref 36.0–46.0)
Hemoglobin: 8.6 g/dL — ABNORMAL LOW (ref 12.0–15.0)
MCH: 29.6 pg (ref 26.0–34.0)
MCHC: 35 g/dL (ref 30.0–36.0)
MCV: 84.5 fL (ref 78.0–100.0)
PLATELETS: 534 10*3/uL — AB (ref 150–400)
RBC: 2.91 MIL/uL — AB (ref 3.87–5.11)
RDW: 17.3 % — ABNORMAL HIGH (ref 11.5–15.5)
WBC: 17.4 10*3/uL — AB (ref 4.0–10.5)

## 2015-06-15 MED ORDER — LEVOFLOXACIN 750 MG PO TABS: 750.0000 mg | ORAL_TABLET | ORAL | Status: DC

## 2015-06-15 MED ORDER — OXYCODONE-ACETAMINOPHEN 7.5-325 MG PO TABS: 1.0000 | ORAL_TABLET | ORAL | Status: DC | PRN

## 2015-06-15 MED ORDER — METHOCARBAMOL 500 MG PO TABS: 1000.0000 mg | ORAL_TABLET | Freq: Three times a day (TID) | ORAL | Status: DC | PRN

## 2015-06-15 NOTE — Care Management (Signed)
Case manager received call to [facility], Per weekday CM notes [facility] is in place. CM did call and confirm same. Asked that days for [facility] change be added [facility]. Bedside RN will complete.

## 2015-06-15 NOTE — Progress Notes (Signed)
Patient ID: Anita BellMelanie Villarreal, female   DOB: [birth year], 44 y.o.   MRN: [id number]   LOS: 11 days   POD#11  Subjective: NSC subjectively. Denies N/V. Pain controlled on orals.   Objective: Vital signs in last 24 hours: Temp:  [97.9 F (36.6 C)-99.1 F (37.3 C)] 97.9 F (36.6 C) (12/10 0844) Pulse Rate:  [100-113] 108 (12/10 0800) Resp:  [16-20] 20 (12/10 0453) BP: (112-118)/(61-75) 112/69 mmHg (12/10 0453) SpO2:  [95 %-97 %] 97 % (12/10 0453) Last BM Date: 06/14/15   JP: 8220ml/24h   Laboratory  CBC  Recent Labs  06/14/15 0608 06/15/15 0358  WBC 16.6* 17.4*  HGB 8.7* 8.6*  HCT 24.7* 24.6*  PLT 516* 534*    Physical Exam General appearance: alert and no distress Resp: clear to auscultation bilaterally Cardio: Mild tachycardia GI: Soft, +BS, VAC in place, JP in place   Assessment/Plan: POD #11 exploratory laparotomy with repair of perforated gastric ulcer---Dr. Cornett -UGI negative for a leak. Pathology negative. -continue drain (serous) ?d/c at discharge? -cont with wound VAC at discharge -ambulate, IS, pain control -CT abd/pel reveals abdominal ascites, but none of this appears to be infected per CT.  -Switched protonix and robaxin to PO Bipolar disorder - home meds ID - rocephin/flagyl/diflucan, now discontinued after 8-9 days. Repeat WBC today shows 16.6. CXR shows b/l lower lobe infiltrates - now on Levaquin Day #3. She continues to be afebrile and does not appear ill, but tachycardic FEN - soft diet, PO pain meds only.  Hypokalemia  DVT prophylaxis - SCD/lovenox Dispo - Tachycardia no better and WBC increased slightly. Will d/w MD regarding d/c or observe another day.    Freeman CaldronMichael J. Danuel Felicetti, PA-C Pager: [phone] 06/15/2015

## 2015-06-17 ENCOUNTER — Inpatient Hospital Stay (HOSPITAL_COMMUNITY)
Admission: EM | Admit: 2015-06-17 | Discharge: 2015-06-29 | DRG: 862 | Disposition: A | Discharge status: Home Health | Payer: Medicaid Other | Attending: Surgery | Admitting: Surgery

## 2015-06-17 ENCOUNTER — Observation Stay (HOSPITAL_COMMUNITY): Payer: Medicaid Other

## 2015-06-17 ENCOUNTER — Encounter (HOSPITAL_COMMUNITY): Payer: Self-pay | Admitting: Emergency Medicine

## 2015-06-17 ENCOUNTER — Emergency Department (HOSPITAL_COMMUNITY): Payer: Medicaid Other

## 2015-06-17 DIAGNOSIS — K3189 Other diseases of stomach and duodenum: Secondary | ICD-10-CM | POA: Diagnosis present

## 2015-06-17 DIAGNOSIS — D649 Anemia, unspecified: Secondary | ICD-10-CM | POA: Diagnosis present

## 2015-06-17 DIAGNOSIS — R059 Cough, unspecified: Secondary | ICD-10-CM

## 2015-06-17 DIAGNOSIS — K9189 Other postprocedural complications and disorders of digestive system: Secondary | ICD-10-CM | POA: Diagnosis present

## 2015-06-17 DIAGNOSIS — J90 Pleural effusion, not elsewhere classified: Secondary | ICD-10-CM | POA: Diagnosis present

## 2015-06-17 DIAGNOSIS — E876 Hypokalemia: Secondary | ICD-10-CM | POA: Diagnosis not present

## 2015-06-17 DIAGNOSIS — J189 Pneumonia, unspecified organism: Secondary | ICD-10-CM | POA: Diagnosis present

## 2015-06-17 DIAGNOSIS — IMO0002 Reserved for concepts with insufficient information to code with codable children: Secondary | ICD-10-CM | POA: Insufficient documentation

## 2015-06-17 DIAGNOSIS — K251 Acute gastric ulcer with perforation: Secondary | ICD-10-CM

## 2015-06-17 DIAGNOSIS — E440 Moderate protein-calorie malnutrition: Secondary | ICD-10-CM | POA: Diagnosis present

## 2015-06-17 DIAGNOSIS — Z79899 Other long term (current) drug therapy: Secondary | ICD-10-CM

## 2015-06-17 DIAGNOSIS — Y95 Nosocomial condition: Secondary | ICD-10-CM | POA: Diagnosis present

## 2015-06-17 DIAGNOSIS — J9811 Atelectasis: Secondary | ICD-10-CM | POA: Diagnosis present

## 2015-06-17 DIAGNOSIS — E669 Obesity, unspecified: Secondary | ICD-10-CM | POA: Diagnosis present

## 2015-06-17 DIAGNOSIS — E039 Hypothyroidism, unspecified: Secondary | ICD-10-CM | POA: Diagnosis present

## 2015-06-17 DIAGNOSIS — Y838 Other surgical procedures as the cause of abnormal reaction of the patient, or of later complication, without mention of misadventure at the time of the procedure: Secondary | ICD-10-CM | POA: Diagnosis present

## 2015-06-17 DIAGNOSIS — Z6839 Body mass index (BMI) 39.0-39.9, adult: Secondary | ICD-10-CM

## 2015-06-17 DIAGNOSIS — K567 Ileus, unspecified: Secondary | ICD-10-CM | POA: Diagnosis present

## 2015-06-17 DIAGNOSIS — R05 Cough: Secondary | ICD-10-CM

## 2015-06-17 DIAGNOSIS — Z7951 Long term (current) use of inhaled steroids: Secondary | ICD-10-CM

## 2015-06-17 DIAGNOSIS — B999 Unspecified infectious disease: Secondary | ICD-10-CM

## 2015-06-17 DIAGNOSIS — K651 Peritoneal abscess: Secondary | ICD-10-CM | POA: Diagnosis present

## 2015-06-17 DIAGNOSIS — T814XXA Infection following a procedure, initial encounter: Principal | ICD-10-CM | POA: Diagnosis present

## 2015-06-17 DIAGNOSIS — R188 Other ascites: Secondary | ICD-10-CM

## 2015-06-17 DIAGNOSIS — F17210 Nicotine dependence, cigarettes, uncomplicated: Secondary | ICD-10-CM | POA: Diagnosis present

## 2015-06-17 DIAGNOSIS — F3160 Bipolar disorder, current episode mixed, unspecified: Secondary | ICD-10-CM | POA: Diagnosis present

## 2015-06-17 DIAGNOSIS — D638 Anemia in other chronic diseases classified elsewhere: Secondary | ICD-10-CM | POA: Diagnosis present

## 2015-06-17 DIAGNOSIS — Z9119 Patient's noncompliance with other medical treatment and regimen: Secondary | ICD-10-CM

## 2015-06-17 DIAGNOSIS — R109 Unspecified abdominal pain: Secondary | ICD-10-CM

## 2015-06-17 HISTORY — DX: Headache: R51

## 2015-06-17 HISTORY — DX: Unspecified asthma, uncomplicated: J45.909

## 2015-06-17 HISTORY — DX: Headache, unspecified: R51.9

## 2015-06-17 HISTORY — DX: Chronic or unspecified gastric ulcer with perforation: K25.5

## 2015-06-17 HISTORY — DX: Disorder of kidney and ureter, unspecified: N28.9

## 2015-06-17 HISTORY — DX: Cutaneous abscess of abdominal wall: L02.211

## 2015-06-17 LAB — CBC
HEMATOCRIT: 22.7 % — AB (ref 36.0–46.0)
HEMOGLOBIN: 7.7 g/dL — AB (ref 12.0–15.0)
MCH: 28.4 pg (ref 26.0–34.0)
MCHC: 33.9 g/dL (ref 30.0–36.0)
MCV: 83.8 fL (ref 78.0–100.0)
Platelets: 597 10*3/uL — ABNORMAL HIGH (ref 150–400)
RBC: 2.71 MIL/uL — ABNORMAL LOW (ref 3.87–5.11)
RDW: 17 % — AB (ref 11.5–15.5)
WBC: 15 10*3/uL — ABNORMAL HIGH (ref 4.0–10.5)

## 2015-06-17 LAB — COMPREHENSIVE METABOLIC PANEL
ALBUMIN: 1.7 g/dL — AB (ref 3.5–5.0)
ALT: 13 U/L — ABNORMAL LOW (ref 14–54)
AST: 21 U/L (ref 15–41)
Alkaline Phosphatase: 142 U/L — ABNORMAL HIGH (ref 38–126)
Anion gap: 8 (ref 5–15)
CHLORIDE: 106 mmol/L (ref 101–111)
CO2: 24 mmol/L (ref 22–32)
Calcium: 7.9 mg/dL — ABNORMAL LOW (ref 8.9–10.3)
Creatinine, Ser: 0.73 mg/dL (ref 0.44–1.00)
GFR calc Af Amer: >60 mL/min (ref >60)
GFR calc non Af Amer: >60 mL/min (ref >60)
GLUCOSE: 106 mg/dL — AB (ref 65–99)
POTASSIUM: 2.9 mmol/L — AB (ref 3.5–5.1)
SODIUM: 138 mmol/L (ref 135–145)
Total Bilirubin: 0.5 mg/dL (ref 0.3–1.2)
Total Protein: 5.8 g/dL — ABNORMAL LOW (ref 6.5–8.1)

## 2015-06-17 LAB — PREGNANCY, URINE: Preg Test, Ur: NEGATIVE

## 2015-06-17 LAB — I-STAT CG4 LACTIC ACID, ED
LACTIC ACID, VENOUS: 0.63 mmol/L (ref 0.5–2.0)
Lactic Acid, Venous: 0.74 mmol/L (ref 0.5–2.0)

## 2015-06-17 LAB — PROTIME-INR
INR: 1.5 — ABNORMAL HIGH (ref 0.00–1.49)
Prothrombin Time: 18.2 seconds — ABNORMAL HIGH (ref 11.6–15.2)

## 2015-06-17 LAB — URINALYSIS, ROUTINE W REFLEX MICROSCOPIC
BILIRUBIN URINE: NEGATIVE
GLUCOSE, UA: NEGATIVE mg/dL
Hgb urine dipstick: NEGATIVE
KETONES UR: NEGATIVE mg/dL
Leukocytes, UA: NEGATIVE
Nitrite: NEGATIVE
PH: 6.5 (ref 5.0–8.0)
PROTEIN: NEGATIVE mg/dL
SPECIFIC GRAVITY, URINE: 1.017 (ref 1.005–1.030)

## 2015-06-17 LAB — PHOSPHORUS: PHOSPHORUS: 3.7 mg/dL (ref 2.5–4.6)

## 2015-06-17 LAB — BRAIN NATRIURETIC PEPTIDE: B Natriuretic Peptide: 56.5 pg/mL (ref 0.0–100.0)

## 2015-06-17 LAB — LIPASE, BLOOD: LIPASE: 36 U/L (ref 11–51)

## 2015-06-17 LAB — POC OCCULT BLOOD, ED: FECAL OCCULT BLD: NEGATIVE

## 2015-06-17 LAB — MAGNESIUM: MAGNESIUM: 1.7 mg/dL (ref 1.7–2.4)

## 2015-06-17 LAB — APTT: APTT: 41 s — AB (ref 24–37)

## 2015-06-17 MED ORDER — MORPHINE SULFATE (PF) 4 MG/ML IV SOLN
4.0000 mg | Freq: Once | INTRAVENOUS | Status: AC
  Administered 2015-06-17: 4 mg via INTRAVENOUS
  Filled 2015-06-17: qty 1

## 2015-06-17 MED ORDER — INSULIN ASPART 100 UNIT/ML ~~LOC~~ SOLN: 0.0000 [IU] | Freq: Four times a day (QID) | SUBCUTANEOUS | Status: DC

## 2015-06-17 MED ORDER — ZIPRASIDONE MESYLATE 20 MG IM SOLR
40.0000 mg | Freq: Two times a day (BID) | INTRAMUSCULAR | Status: DC
  Administered 2015-06-17 – 2015-06-18 (×3): 40 mg via INTRAMUSCULAR
  Filled 2015-06-17 (×6): qty 40

## 2015-06-17 MED ORDER — SODIUM CHLORIDE 0.9 % IV SOLN
1.0000 g | Freq: Three times a day (TID) | INTRAVENOUS | Status: DC
  Administered 2015-06-17 – 2015-06-26 (×27): 1 g via INTRAVENOUS
  Filled 2015-06-17 (×31): qty 1

## 2015-06-17 MED ORDER — FENTANYL CITRATE (PF) 100 MCG/2ML IJ SOLN
INTRAMUSCULAR | Status: AC | PRN
  Administered 2015-06-17 (×3): 50 ug via INTRAVENOUS

## 2015-06-17 MED ORDER — POTASSIUM CHLORIDE IN NACL 20-0.9 MEQ/L-% IV SOLN
INTRAVENOUS | Status: DC
  Administered 2015-06-17 – 2015-06-18 (×2): via INTRAVENOUS
  Filled 2015-06-17 (×4): qty 1000

## 2015-06-17 MED ORDER — VANCOMYCIN HCL IN DEXTROSE 1-5 GM/200ML-% IV SOLN
1000.0000 mg | Freq: Three times a day (TID) | INTRAVENOUS | Status: DC
  Administered 2015-06-18 – 2015-06-23 (×17): 1000 mg via INTRAVENOUS
  Filled 2015-06-17 (×21): qty 200

## 2015-06-17 MED ORDER — ONDANSETRON HCL 4 MG/2ML IJ SOLN
4.0000 mg | Freq: Four times a day (QID) | INTRAMUSCULAR | Status: AC | PRN
  Administered 2015-06-17: 4 mg via INTRAVENOUS
  Filled 2015-06-17: qty 2

## 2015-06-17 MED ORDER — IOHEXOL 300 MG/ML  SOLN
100.0000 mL | Freq: Once | INTRAMUSCULAR | Status: AC | PRN
  Administered 2015-06-17: 100 mL via INTRAVENOUS

## 2015-06-17 MED ORDER — ALBUTEROL SULFATE (2.5 MG/3ML) 0.083% IN NEBU: 2.5000 mg | INHALATION_SOLUTION | Freq: Four times a day (QID) | RESPIRATORY_TRACT | Status: DC | PRN

## 2015-06-17 MED ORDER — FUROSEMIDE 10 MG/ML IJ SOLN
20.0000 mg | Freq: Once | INTRAMUSCULAR | Status: AC
  Administered 2015-06-17: 20 mg via INTRAVENOUS
  Filled 2015-06-17: qty 2

## 2015-06-17 MED ORDER — HYDROMORPHONE HCL 1 MG/ML IJ SOLN
1.0000 mg | Freq: Once | INTRAMUSCULAR | Status: AC
  Administered 2015-06-17: 1 mg via INTRAVENOUS
  Filled 2015-06-17: qty 1

## 2015-06-17 MED ORDER — TRACE MINERALS CR-CU-MN-SE-ZN 10-1000-500-60 MCG/ML IV SOLN
INTRAVENOUS | Status: DC
  Filled 2015-06-17: qty 720

## 2015-06-17 MED ORDER — HYDROXYZINE HCL 50 MG/ML IM SOLN
50.0000 mg | Freq: Every day | INTRAMUSCULAR | Status: DC
  Administered 2015-06-17 – 2015-06-20 (×4): 50 mg via INTRAMUSCULAR
  Filled 2015-06-17 (×8): qty 1

## 2015-06-17 MED ORDER — ONDANSETRON HCL 4 MG/2ML IJ SOLN
4.0000 mg | Freq: Once | INTRAMUSCULAR | Status: AC | PRN
  Administered 2015-06-17: 4 mg via INTRAVENOUS
  Filled 2015-06-17: qty 2

## 2015-06-17 MED ORDER — STERILE WATER FOR INJECTION IJ SOLN
INTRAMUSCULAR | Status: AC
  Administered 2015-06-17: 2.4 mL
  Filled 2015-06-17: qty 10

## 2015-06-17 MED ORDER — SODIUM CHLORIDE 0.9 % IV SOLN
2000.0000 mg | Freq: Once | INTRAVENOUS | Status: AC
  Administered 2015-06-17: 2000 mg via INTRAVENOUS
  Filled 2015-06-17: qty 2000

## 2015-06-17 MED ORDER — BUDESONIDE-FORMOTEROL FUMARATE 80-4.5 MCG/ACT IN AERO
2.0000 | INHALATION_SPRAY | Freq: Two times a day (BID) | RESPIRATORY_TRACT | Status: DC
  Administered 2015-06-19 – 2015-06-29 (×19): 2 via RESPIRATORY_TRACT
  Filled 2015-06-17 (×3): qty 6.9

## 2015-06-17 MED ORDER — MIDAZOLAM HCL 2 MG/2ML IJ SOLN
INTRAMUSCULAR | Status: AC
  Filled 2015-06-17: qty 6

## 2015-06-17 MED ORDER — PIPERACILLIN-TAZOBACTAM 3.375 G IVPB: 3.3750 g | Freq: Three times a day (TID) | INTRAVENOUS | Status: DC

## 2015-06-17 MED ORDER — FAT EMULSION 20 % IV EMUL
240.0000 mL | INTRAVENOUS | Status: DC
  Filled 2015-06-17: qty 250

## 2015-06-17 MED ORDER — LORAZEPAM 2 MG/ML IJ SOLN
0.5000 mg | Freq: Four times a day (QID) | INTRAMUSCULAR | Status: DC | PRN
  Administered 2015-06-17 – 2015-06-21 (×13): 1 mg via INTRAVENOUS
  Filled 2015-06-17 (×13): qty 1

## 2015-06-17 MED ORDER — MORPHINE SULFATE (PF) 2 MG/ML IV SOLN
1.0000 mg | INTRAVENOUS | Status: DC | PRN
  Administered 2015-06-17: 4 mg via INTRAVENOUS
  Administered 2015-06-17: 2 mg via INTRAVENOUS
  Administered 2015-06-17: 4 mg via INTRAVENOUS
  Administered 2015-06-17: 2 mg via INTRAVENOUS
  Administered 2015-06-17 – 2015-06-18 (×3): 4 mg via INTRAVENOUS
  Administered 2015-06-18: 2 mg via INTRAVENOUS
  Administered 2015-06-18 – 2015-06-19 (×7): 4 mg via INTRAVENOUS
  Administered 2015-06-19: 2 mg via INTRAVENOUS
  Administered 2015-06-19: 4 mg via INTRAVENOUS
  Administered 2015-06-19: 2 mg via INTRAVENOUS
  Administered 2015-06-19 (×2): 4 mg via INTRAVENOUS
  Administered 2015-06-19 – 2015-06-20 (×3): 2 mg via INTRAVENOUS
  Administered 2015-06-20: 4 mg via INTRAVENOUS
  Administered 2015-06-20 – 2015-06-21 (×6): 2 mg via INTRAVENOUS
  Administered 2015-06-21: 4 mg via INTRAVENOUS
  Administered 2015-06-21 (×2): 2 mg via INTRAVENOUS
  Administered 2015-06-22 (×7): 4 mg via INTRAVENOUS
  Filled 2015-06-17 (×7): qty 2
  Filled 2015-06-17: qty 1
  Filled 2015-06-17 (×5): qty 2
  Filled 2015-06-17: qty 1
  Filled 2015-06-17 (×3): qty 2
  Filled 2015-06-17: qty 1
  Filled 2015-06-17 (×2): qty 2
  Filled 2015-06-17 (×3): qty 1
  Filled 2015-06-17: qty 2
  Filled 2015-06-17: qty 1
  Filled 2015-06-17: qty 2
  Filled 2015-06-17: qty 1
  Filled 2015-06-17 (×3): qty 2
  Filled 2015-06-17 (×2): qty 1
  Filled 2015-06-17: qty 2
  Filled 2015-06-17: qty 1
  Filled 2015-06-17 (×2): qty 2
  Filled 2015-06-17 (×2): qty 1
  Filled 2015-06-17 (×4): qty 2
  Filled 2015-06-17: qty 1

## 2015-06-17 MED ORDER — DEXTROSE 5 % IV SOLN
1000.0000 mg | Freq: Three times a day (TID) | INTRAVENOUS | Status: DC | PRN
  Administered 2015-06-22 (×2): 1000 mg via INTRAVENOUS
  Filled 2015-06-17 (×3): qty 10

## 2015-06-17 MED ORDER — PIPERACILLIN-TAZOBACTAM 3.375 G IVPB 30 MIN
3.3750 g | Freq: Once | INTRAVENOUS | Status: AC
  Administered 2015-06-17: 3.375 g via INTRAVENOUS
  Filled 2015-06-17: qty 50

## 2015-06-17 MED ORDER — MIDAZOLAM HCL 2 MG/2ML IJ SOLN
INTRAMUSCULAR | Status: AC | PRN
  Administered 2015-06-17 (×3): 1 mg via INTRAVENOUS

## 2015-06-17 MED ORDER — FENTANYL CITRATE (PF) 100 MCG/2ML IJ SOLN
INTRAMUSCULAR | Status: AC
  Filled 2015-06-17: qty 4

## 2015-06-17 MED ORDER — POTASSIUM CHLORIDE 10 MEQ/100ML IV SOLN
10.0000 meq | INTRAVENOUS | Status: AC
  Administered 2015-06-17 (×3): 10 meq via INTRAVENOUS
  Filled 2015-06-17 (×3): qty 100

## 2015-06-17 MED ORDER — DEXTROSE 5 % IV SOLN
3.0000 g | Freq: Once | INTRAVENOUS | Status: AC
  Administered 2015-06-17: 3 g via INTRAVENOUS
  Filled 2015-06-17: qty 6

## 2015-06-17 MED ORDER — LIDOCAINE HCL 1 % IJ SOLN
INTRAMUSCULAR | Status: AC
  Filled 2015-06-17: qty 20

## 2015-06-17 NOTE — ED Notes (Signed)
Patient transported to CT 

## 2015-06-17 NOTE — Sedation Documentation (Signed)
Denies pain, resting comfortably, report given to [facility]N RN

## 2015-06-17 NOTE — Consult Note (Signed)
[facility] Medical Consultation  [name] WUJ:[id number] DOB: 1970-07-20 DOA: 06/17/2015 PCP: [name], MD   Requesting physician:  [facility]  Date of consultation:  06/17/15 Reason for consultation: PNA, anemia, lower extremity swelling  Impression/Recommendations  HCAP vrs volume overload vrs other pleuritic process. -agree with IV antibiotics. On IV Vancomycin. Changed from Zosyn to Meripenim which should cover PNA and intraabdominal infections -Consider ID consult if fails to improve over next couple of days.  -Will give dose of diuretics to see if breathing improves.  -though low serum albumin could be contributing to lower extremity edema, given right pleural effusion will -obtain echocardiogram.  -If not improving by tomorrow then consider thoracentesis.  -Prn 02.    Normocytic anemia. Baseline hemoglobin mid 14 range, fell to 11.6  On 06/06/15 following repair of gastric surgery opn 06/04/15. Hemoglobin drifted further down to 8.7 by time of discharge on 06/15/15. Hgb currently down to 7.7. No overt bleeding. CT scan does show fluid collections concerning for abscess or postoperative seroma. -IR to see regarding aspiration of fluid collection so should be able to see if a seroma.  -Monitor CBC, transfuse if necessary.   Perforated gastric ulcer, s/p repair 06/04/15. She has a wound vac in place. Now back in ED with abdominal pain. CTscan reveals a defect in the wall of the stomach with bubbles of air traversing outside of the stomach most consistent with a perforated gastric ulcer. There are is a 3.2 x 9.5 x 7.3 cm fluid collection along the left anterior abdominal wall most concerning for abscess or postoperative seroma. Small areas of fluid collection within the left omentum concerning for multiple small abscesses.  -surgery admitting patient. Not candidate for repeat laparotomy. Surgery to consult IR to drain fluid collections.    Hypokalemia. Repletion in progress.  -will check mg+ level  Protein calorie malnutrition. TNA started.   We will followup again tomorrow. Please contact me if I can be of assistance in the meanwhile. Thank you for this consultation.  Chief Complaint:  Leg swelling, anemia, and PNA  HPI:  Anita Villarreal is a 44 y.o. female   discharged home from the hospital 2 days ago after having a perforated bowel, s/p exoploratory laparotomy closure of gastric ulcer 11/29. Chest x-ray 06/13/15 revealed bilateral infiltrates compatible with pneumonia, patient was started on Levaquin. Patient comes back to the emergency department today with shortness of breath and abdominal pain, O2 sats 96% on room air. Surgery admitting patient.   ED COURSE:     Labs   Potassium 2.9, alkaline phosphatase 142, albumin 1.7, lipase 36, lactic acid 0.63  BNP 56.5  Urinalysis:    Cloudy, negative leukocytes, negative nitrites, pregnancy test negative  EKG:    Sinus tachycardia, HR 106.   Review of Systems:  Constitutional: Denies diaphoresis, appetite change and fatigue, ++ chills, .  HEENT: Denies photophobia, eye pain, redness, hearing loss, ear pain, congestion, sore throat, rhinorrhea, sneezing, mouth sores, trouble swallowing, neck pain, neck stiffness and tinnitus.  Respiratory: , DOE, cough, chest tightness, and wheezing, ++ SOB, ++ dry cough Cardiovascular: Denies chest pain, palpitations. ++ leg swelling.  Gastrointestinal: Denies nausea, vomiting, diarrhea, constipation, blood in stool and abdominal distention, ++ abdominal pain, Genitourinary: Denies dysuria, urgency, frequency, hematuria, flank pain and difficulty urinating.  Musculoskeletal: Denies myalgias, back pain, joint swelling, arthralgias and gait problem.  Skin: Denies pallor, rash and wound.  Neurological: Denies dizziness, seizures, syncope, weakness, light-headedness, numbness and headaches.  Hematological: Denies adenopathy. Easy  bruising, personal or family bleeding history  Psychiatric/Behavioral: Denies suicidal ideation, mood changes, confusion, nervousness, sleep disturbance and agitation   Past Medical History  Diagnosis Date  . Bipolar 1 disorder (HCC)   . 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 Cholecystectomy    . Appendectomy    . Exploratory laparotomy  06/04/15    Cheree Ditto patch repair of gastric ulcer  . Laparotomy N/A 06/04/2015    Procedure: EXPLORATORY LAPAROTOMY WITH REPAIROF GASTRIC ULCER AND BIOPSY OF GASTRIC ULCER;  Surgeon: Harriette Bouillon, MD;  Location: [facility] OR;  Service: General;  Laterality: N/A;  . Perforated bowel     Social History:  reports that she has quit smoking. She does not have any smokeless tobacco history on file. She reports that she does not drink alcohol. Her drug history is not on file.  Allergies  Allergen Reactions  . Erythromycin Other (See Comments)    Tongue swelling   . Sulfa Antibiotics     swelling   No family history on file.  Prior to Admission medications   Medication Sig Start Date End Date Taking? Authorizing Provider  albuterol (PROVENTIL HFA;VENTOLIN HFA) 108 (90 BASE) MCG/ACT inhaler Inhale 2 puffs into the lungs every 6 (six) hours as needed. Wheezing   Yes Historical Provider, MD  albuterol (PROVENTIL) (2.5 MG/3ML) 0.083% nebulizer solution Take 2.5 mg by nebulization every 6 (six) hours as needed for wheezing.   Yes Historical Provider, MD  budesonide-formoterol (SYMBICORT) 80-4.5 MCG/ACT inhaler Inhale 2 puffs into the lungs 2 (two) times daily.   Yes Historical Provider, MD  dicyclomine (BENTYL) 20 MG tablet Take 20 mg by mouth 4 (four) times daily.   Yes Historical Provider, MD  gabapentin (NEURONTIN) 100 MG capsule Take 100 mg by mouth 2 (two) times daily.   Yes Historical Provider, MD  hydrOXYzine (ATARAX/VISTARIL) 50 MG tablet Take 50 mg by mouth at bedtime.   Yes Historical Provider, MD   levofloxacin (LEVAQUIN) 750 MG tablet Take 1 tablet (750 mg total) by mouth daily. 06/15/15  Yes Freeman Caldron, PA-C  lithium carbonate 300 MG capsule Take 300 mg by mouth 2 (two) times daily.   Yes Historical Provider, MD  methocarbamol (ROBAXIN) 500 MG tablet Take 2 tablets (1,000 mg total) by mouth every 8 (eight) hours as needed for muscle spasms (or pain). 06/15/15  Yes Freeman Caldron, PA-C  oxyCODONE-acetaminophen (PERCOCET) 7.5-325 MG tablet Take 1-2 tablets by mouth every 4 (four) hours as needed. 06/15/15  Yes Freeman Caldron, PA-C  pantoprazole (PROTONIX) 40 MG tablet Take 1 tablet (40 mg total) by mouth 2 (two) times daily before a meal. 06/14/15  Yes Nonie Hoyer, PA-C  promethazine (PHENERGAN) 25 MG tablet Take 25 mg by mouth every 6 (six) hours as needed for nausea or vomiting.   Yes Historical Provider, MD  SUMAtriptan (IMITREX) 100 MG tablet Take 100 mg by mouth every 2 (two) hours as needed. migraines 12/20/14  Yes Historical Provider, MD  ziprasidone (GEODON) 80 MG capsule Take 80 mg by mouth 2 (two) times daily with a meal.   Yes Historical Provider, MD  famotidine (PEPCID) 10 MG tablet Take 1 tablet (10 mg total) by mouth 2 (two) times daily as needed for heartburn or indigestion. Patient not taking: Reported on 06/17/2015 06/14/15   Nonie Hoyer, PA-C   Physical Exam: Blood pressure 103/67, pulse 98, temperature 99.1 F (37.3 C), temperature source Rectal, resp. rate 18,  height 5' 8.25" (1.734 m), weight 115.667 kg (255 lb), last menstrual period 04/04/2015, SpO2 99 %. Filed Vitals:   06/17/15 0850 06/17/15 1032  BP: 115/74 103/67  Pulse: 99 98  Temp:    Resp: 18 18    Constitutional: Vital signs reviewed. Patient is a well-developed and well-nourished in no acute distress and cooperative with exam. Alert and oriented x3.  Head: Normocephalic and atraumatic   Mouth: no erythema or exudates Eyes: PER,  conjunctivae normal, No scleral icterus.  Neck: Supple,  Trachea midline normal ROM, No JVD, mass, .  Cardiovascular: RRR, S1 normal, S2 normal, no MRG, pulses symmetric and intact bilaterally  Pulmonary/Chest: CTA except for RLL where there are signifcantly  decreased breath sounds Abdominal: Soft, mildly distended and tympanitic. Decreased bowel sounds . Moderate tenderness of right mid and right lower abdomen. Midline incision with dressing and wound vac in place. .  Musculoskeletal: No joint deformities, erythema,  Ext: 1-2 + BLE edema.   Hematology: no cervical lymphadenopathy.  Neurological: A&O x3, Strenght is normal and symmetric bilaterally, cranial nerve II-XII are grossly intact, no focal motor deficit, sensory intact to light touch bilaterally.  Skin: Warm, dry and intact. No rash, cyanosis, or clubbing.  Psychiatric: Normal mood and affect. speech and behavior is normal. Judgment and thought content normal. Cognition and memory are normal.   Labs on Admission:  Basic Metabolic Panel:  Recent Labs Lab 06/11/15 0516 06/12/15 0551 06/13/15 0445 06/14/15 0608 06/17/15 0428  NA 142 142 141 140 138  K 3.1* 3.2* 3.2* 3.3* 2.9*  CL 113* 112* 109 108 106  CO2 GLUCOSE 108* 115* 92 109* 106*  BUN <5* <5* <5* <5* <5*  CREATININE 0.89 0.80 0.77 0.69 0.73  CALCIUM 7.7* 7.8* 8.0* 7.7* 7.9*  MG 1.7  --   --   --   --    Liver Function Tests:  Recent Labs Lab 06/17/15 0428  AST 21  ALT 13*  ALKPHOS 142*  BILITOT 0.5  PROT 5.8*  ALBUMIN 1.7*    Recent Labs Lab 06/17/15 0428  LIPASE 36    CBC:  Recent Labs Lab 06/12/15 0551 06/13/15 0445 06/14/15 0608 06/15/15 0358 06/17/15 0428  WBC 17.8* 19.1* 16.6* 17.4* 15.0*  HGB 9.0* 9.0* 8.7* 8.6* 7.7*  HCT 26.0* 26.2* 24.7* 24.6* 22.7*  MCV 83.3 84.2 84.3 84.5 83.8  PLT 395 486* 516* 534* 597*   CBG:  Recent Labs Lab 06/12/15 2034 06/13/15 0012 06/13/15 0411 06/13/15 0736 06/13/15 1211  GLUCAP 92 83 85 90 102*    Radiological Exams on  Admission: Ct Abdomen Pelvis W Contrast  06/17/2015  CLINICAL DATA:  Diffuse abdominal pain. Prior abdominal surgery 06/04/2015. EXAM: CT ABDOMEN AND PELVIS WITH CONTRAST TECHNIQUE: Multidetector CT imaging of the abdomen and pelvis was performed using the standard protocol following bolus administration of intravenous contrast. CONTRAST:  OMNIPAQUE IOHEXOL 300 MG/ML  SOLN COMPARISON:  06/12/2015 FINDINGS: Lower chest: Small right pleural effusion. Right lower lobe airspace disease likely reflecting compressive atelectasis. Normal heart size. Hepatobiliary: Normal liver. Prior cholecystectomy. No intrahepatic or extrahepatic biliary ductal dilatation. Pancreas: Normal. Spleen: Normal. Adrenals/Urinary Tract: Normal adrenal glands. Normal kidneys. No urolithiasis or obstructive uropathy. Partially decompressed, normal bladder. Stomach/Bowel: There is a defect in the wall of the lesser curvature of the stomach with bubbles of air traversing outside of the stomach most consistent with a perforated gastric ulcer. Multiple fluid-filled loops of small bowel with mild small bowel  dilatation. No colonic dilatation. Prior appendectomy. No pneumatosis, pneumoperitoneum or portal venous gas. Interval removal of surgical drains. Small amount of perihepatic free fluid. No pneumoperitoneum, pneumatosis or portal venous gas. Postsurgical changes in the anterior abdominal wall from midline laparotomy. Vascular/Lymphatic: Normal caliber abdominal aorta. No abdominal or pelvic lymphadenopathy. Reproductive: Normal uterus. Bilateral tubal ligation. Cystic structures in the adnexa bilaterally which may reflect hydrosalpinx. Other: Intraperitoneal 3.2 x 9.5 x 7.3 cm fluid collection along along the left anterior abdominal wall most concerning for abscess or postoperative seroma. There is severe nodularity and small areas of fluid collection within the left side of the omentum. There is a 3.9 x 7.5 cm fluid collection along the  fundus of the stomach in which may reflect a postoperative seroma versus an abscess. There is a second fluid collection more inferiorly measuring 4.7 x 5.3 cm. Mild body wall edema. Musculoskeletal: No aggressive lytic or sclerotic osseous lesion. No acute osseous abnormality. IMPRESSION: 1. Defect in the wall of the lesser curvature of the stomach with bubbles of air traversing outside of the stomach most consistent with a perforated gastric ulcer. 2. Intraperitoneal 3.2 x 9.5 x 7.3 cm fluid collection along along the left anterior abdominal wall most concerning for abscess or postoperative seroma. Severe nodularity and small areas of fluid collection within the left omentum concerning for multiple small abscesses. 3. Small right pleural effusion with airspace disease likely reflecting compressive atelectasis. Critical Value/emergent results were called by telephone at the time of interpretation on 06/17/2015 at 10:10 am to Dr. Jaci CarrelHRISTOPHER POLLINA , who verbally acknowledged these results. Electronically Signed   By: Elige KoHetal  Patel   On: 06/17/2015 10:13   Dg Abd Acute W/chest  06/17/2015  CLINICAL DATA:  Acute onset of generalized abdominal pain, nausea and vomiting. Initial encounter. EXAM: DG ABDOMEN ACUTE W/ 1V CHEST COMPARISON:  Chest radiograph performed 06/13/2015 FINDINGS: The lungs are well-aerated. A small to moderate right-sided pleural effusion is noted, with right basilar airspace opacification. This is mildly worsened from the prior study, concerning for pneumonia or asymmetric pulmonary edema. Mild left basilar atelectasis is noted. Vascular congestion is noted. There is no evidence of pneumothorax. The cardiomediastinal silhouette is borderline enlarged. The visualized bowel gas pattern is unremarkable. Scattered stool and air are seen within the colon; there is no evidence of small bowel dilatation to suggest obstruction. No free intra-abdominal air is identified on the provided upright view.  Clips are noted within the right upper quadrant, reflecting prior cholecystectomy. No acute osseous abnormalities are seen; the sacroiliac joints are unremarkable in appearance. Bilateral tubal ligation clips are noted. IMPRESSION: 1. Small to moderate right-sided pleural effusion, with right basilar airspace opacification. This is mildly worsened from the prior study, concerning for pneumonia or asymmetric pulmonary edema. Mild left basilar atelectasis noted. 2. Vascular congestion and borderline cardiomegaly noted. 3. Unremarkable bowel gas pattern; no free intra-abdominal air seen. Electronically Signed   By: Roanna RaiderJeffery  Chang M.D.   On: 06/17/2015 06:10    Time spent: 45 minutes  Willette Clusteraula Guenther [facility] Pager [phone]  If 7PM-7AM, please contact night-coverage [email or web address] Password [email or web address] 06/17/2015, 11:35 AM

## 2015-06-17 NOTE — Sedation Documentation (Signed)
Successful abscess drain placed- ER RN to call report to [employer] RN. Will transfer to 6N from CT

## 2015-06-17 NOTE — Procedures (Signed)
CT guided LLQ abscess drain catheter placement 20ml purulent aspirate sent for GS, C&S No complication No blood loss. [facility].

## 2015-06-17 NOTE — ED Provider Notes (Signed)
CSN: [id number]     Arrival date & time 06/17/15  4098 History   First MD Initiated Contact with Patient 06/17/15 0347     Chief Complaint  Patient presents with  . Abdominal Pain  . Shortness of Breath     (Consider location/radiation/quality/duration/timing/severity/associated sxs/prior Treatment) Patient is a 44 y.o. female presenting with abdominal pain and shortness of breath. The history is provided by the patient.  Abdominal Pain Pain location:  RUQ and RLQ Pain quality: aching, bloating and cramping   Pain radiates to:  R flank Pain severity:  Moderate Onset quality:  Gradual Associated symptoms: nausea and shortness of breath   Associated symptoms: no chills, no diarrhea, no dysuria, no fever and no vomiting   Associated symptoms comment:  Patient presents with complaint of pain in the right upper and lower abdomen x 1 day. The pain radiates to the right flank. She has a history of admission 06/04/15 to the surgical service with perforated gastric ulcer requiring emergency surgical intervention. She was discharged on 06/15/15 with wound vac in place. She reports compliance with all medications. No fever, diarrhea or bloody bowel movements. She has nausea without vomiting. She also reports urinary frequency.  Shortness of Breath Associated symptoms: abdominal pain   Associated symptoms: no fever and no vomiting     Past Medical History  Diagnosis Date  . Bipolar 1 disorder (HCC)   . 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 Cholecystectomy    . Appendectomy    . Exploratory laparotomy  06/04/15    Cheree Ditto patch repair of gastric ulcer  . Laparotomy N/A 06/04/2015    Procedure: EXPLORATORY LAPAROTOMY WITH REPAIROF GASTRIC ULCER AND BIOPSY OF GASTRIC ULCER;  Surgeon: Harriette Bouillon, MD;  Location: [facility] OR;  Service: General;  Laterality: N/A;  . Perforated bowel     No family history on file. Social History  Substance Use  Topics  . Smoking status: Former Games developer  . Smokeless tobacco: Not on file  . Alcohol Use: No   OB History    No data available     Review of Systems  Constitutional: Negative for fever and chills.  Respiratory: Positive for shortness of breath.   Cardiovascular: Negative.   Gastrointestinal: Positive for nausea and abdominal pain. Negative for vomiting, diarrhea and blood in stool.  Genitourinary: Positive for frequency and flank pain. Negative for dysuria.  Musculoskeletal: Negative.   Skin: Negative.   Neurological: Negative.  Negative for weakness.      Allergies  Erythromycin and Sulfa antibiotics  Home Medications   Prior to Admission medications   Medication Sig Start Date End Date Taking? Authorizing Provider  albuterol (PROVENTIL HFA;VENTOLIN HFA) 108 (90 BASE) MCG/ACT inhaler Inhale 2 puffs into the lungs every 6 (six) hours as needed. Wheezing   Yes Historical Provider, MD  albuterol (PROVENTIL) (2.5 MG/3ML) 0.083% nebulizer solution Take 2.5 mg by nebulization every 6 (six) hours as needed for wheezing.   Yes Historical Provider, MD  budesonide-formoterol (SYMBICORT) 80-4.5 MCG/ACT inhaler Inhale 2 puffs into the lungs 2 (two) times daily.   Yes Historical Provider, MD  dicyclomine (BENTYL) 20 MG tablet Take 20 mg by mouth 4 (four) times daily.   Yes Historical Provider, MD  gabapentin (NEURONTIN) 100 MG capsule Take 100 mg by mouth 2 (two) times daily.   Yes Historical Provider, MD  hydrOXYzine (ATARAX/VISTARIL) 50 MG tablet Take 50 mg by mouth at bedtime.   Yes Historical  Provider, MD  levofloxacin (LEVAQUIN) 750 MG tablet Take 1 tablet (750 mg total) by mouth daily. 06/15/15  Yes Freeman CaldronMichael J Jeffery, PA-C  lithium carbonate 300 MG capsule Take 300 mg by mouth 2 (two) times daily.   Yes Historical Provider, MD  methocarbamol (ROBAXIN) 500 MG tablet Take 2 tablets (1,000 mg total) by mouth every 8 (eight) hours as needed for muscle spasms (or pain). 06/15/15  Yes  Freeman CaldronMichael J Jeffery, PA-C  oxyCODONE-acetaminophen (PERCOCET) 7.5-325 MG tablet Take 1-2 tablets by mouth every 4 (four) hours as needed. 06/15/15  Yes Freeman CaldronMichael J Jeffery, PA-C  pantoprazole (PROTONIX) 40 MG tablet Take 1 tablet (40 mg total) by mouth 2 (two) times daily before a meal. 06/14/15  Yes Nonie HoyerMegan N Baird, PA-C  promethazine (PHENERGAN) 25 MG tablet Take 25 mg by mouth every 6 (six) hours as needed for nausea or vomiting.   Yes Historical Provider, MD  SUMAtriptan (IMITREX) 100 MG tablet Take 100 mg by mouth every 2 (two) hours as needed. migraines 12/20/14  Yes Historical Provider, MD  ziprasidone (GEODON) 80 MG capsule Take 80 mg by mouth 2 (two) times daily with a meal.   Yes Historical Provider, MD  famotidine (PEPCID) 10 MG tablet Take 1 tablet (10 mg total) by mouth 2 (two) times daily as needed for heartburn or indigestion. Patient not taking: Reported on 06/17/2015 06/14/15   Nonie HoyerMegan N Baird, PA-C   BP 121/71 mmHg  Pulse 108  Temp(Src) 99.3 F (37.4 C) (Oral)  Resp 20  Ht 5' 8.25" (1.734 m)  Wt 115.667 kg  BMI 38.47 kg/m2  SpO2 96%  LMP 04/04/2015 Physical Exam  Constitutional: She is oriented to person, place, and time. She appears well-developed and well-nourished.  HENT:  Head: Normocephalic.  Neck: Normal range of motion. Neck supple.  Cardiovascular: Normal rate and regular rhythm.   Pulmonary/Chest: Effort normal and breath sounds normal.  Abdominal: Soft. Bowel sounds are normal. There is tenderness. There is no rebound and no guarding.  Soft abdomen with moderate tenderness on right upper and lower quadrants, mild tenderness on left. She is moderately distended. Wound vac in place in midline abdomen.   Musculoskeletal: Normal range of motion.  Neurological: She is alert and oriented to person, place, and time.  Skin: Skin is warm and dry. No rash noted.  Psychiatric: She has a normal mood and affect.    ED Course  Procedures (including critical care time) Labs  Review Labs Reviewed  LIPASE, BLOOD  COMPREHENSIVE METABOLIC PANEL  CBC  URINALYSIS, ROUTINE W REFLEX MICROSCOPIC (NOT AT [facility])   Results for orders placed or performed during the hospital encounter of 06/17/15  Lipase, blood  Result Value Ref Range   Lipase 36 11 - 51 U/L  Comprehensive metabolic panel  Result Value Ref Range   Sodium 138 135 - 145 mmol/L   Potassium 2.9 (L) 3.5 - 5.1 mmol/L   Chloride 106 101 - 111 mmol/L   CO2 24 22 - 32 mmol/L   Glucose, Bld 106 (H) 65 - 99 mg/dL   BUN <5 (L) 6 - 20 mg/dL   Creatinine, Ser 2.950.73 0.44 - 1.00 mg/dL   Calcium 7.9 (L) 8.9 - 10.3 mg/dL   Total Protein 5.8 (L) 6.5 - 8.1 g/dL   Albumin 1.7 (L) 3.5 - 5.0 g/dL   AST 21 15 - 41 U/L   ALT 13 (L) 14 - 54 U/L   Alkaline Phosphatase 142 (H) 38 - 126 U/L   Total  Bilirubin 0.5 0.3 - 1.2 mg/dL   GFR calc non Af Amer >60 >60 mL/min   GFR calc Af Amer >60 >60 mL/min   Anion gap 8 5 - 15  CBC  Result Value Ref Range   WBC 15.0 (H) 4.0 - 10.5 K/uL   RBC 2.71 (L) 3.87 - 5.11 MIL/uL   Hemoglobin 7.7 (L) 12.0 - 15.0 g/dL   HCT 98.1 (L) 19.1 - 47.8 %   MCV 83.8 78.0 - 100.0 fL   MCH 28.4 26.0 - 34.0 pg   MCHC 33.9 30.0 - 36.0 g/dL   RDW 29.5 (H) 62.1 - 30.8 %   Platelets 597 (H) 150 - 400 K/uL  Urinalysis, Routine w reflex microscopic (not at [facility])  Result Value Ref Range   Color, Urine YELLOW YELLOW   APPearance CLOUDY (A) CLEAR   Specific Gravity, Urine 1.017 1.005 - 1.030   pH 6.5 5.0 - 8.0   Glucose, UA NEGATIVE NEGATIVE mg/dL   Hgb urine dipstick NEGATIVE NEGATIVE   Bilirubin Urine NEGATIVE NEGATIVE   Ketones, ur NEGATIVE NEGATIVE mg/dL   Protein, ur NEGATIVE NEGATIVE mg/dL   Nitrite NEGATIVE NEGATIVE   Leukocytes, UA NEGATIVE NEGATIVE     Imaging Review No results found. I have personally reviewed and evaluated these images and lab results as part of my medical decision-making.   EKG Interpretation   Date/Time:  Monday June 17 2015 03:49:49 EST Ventricular Rate:   106 PR Interval:  144 QRS Duration: 74 QT Interval:  361 QTC Calculation: 479 R Axis:   21 Text Interpretation:  Sinus tachycardia Nonspecific T abnormalities,  diffuse leads No significant change since last tracing Confirmed by  POLLINA  MD, CHRISTOPHER [id number]) on 06/17/2015 4:16:33 AM      MDM   Final diagnoses:  None    1. Abdominal pain  Patient with recent surgical repair perforated gastric ulcer presents 2 days after discharge with complaint of abdominal pain and nausea. No fever, diarrhea or blood in her bowel movements. She has wound vac in place, low grade temperature and is slightly tachycardic. She has also dropped her hemoglobin over 1 gm since discharge 2 days ago.   Plain film abdomen and hemoccult pending. Patient care signed out to Dr. Blinda Leatherwood at end of shift for consultation to surgery after pending results final.     Elpidio Anis, PA-C 06/17/15 0615  Dione Booze, MD 06/17/15 [phone]

## 2015-06-17 NOTE — Progress Notes (Signed)
PARENTERAL NUTRITION CONSULT NOTE - INITIAL  Pharmacy Consult for TPN Indication: Gastric perforation  Allergies  Allergen Reactions  . Erythromycin Other (See Comments)    Tongue swelling   . Sulfa Antibiotics     swelling    Patient Measurements: Height: 5' 8.25" (173.4 cm) Weight: 255 lb (115.667 kg) IBW/kg (Calculated) : 64.48 Adjusted Body Weight: 77 kg BMI: 39  Vital Signs: Temp: 99.1 F (37.3 C) (12/12 0727) Temp Source: Rectal (12/12 0727) BP: 103/67 mmHg (12/12 1032) Pulse Rate: 98 (12/12 1032)  Labs:  Recent Labs  06/15/15 0358 06/17/15 0428  WBC 17.4* 15.0*  HGB 8.6* 7.7*  HCT 24.6* 22.7*  PLT 534* 597*     Recent Labs  06/17/15 0428  NA 138  K 2.9*  CL 106  CO2 24  GLUCOSE 106*  BUN <5*  CREATININE 0.73  CALCIUM 7.9*  PROT 5.8*  ALBUMIN 1.7*  AST 21  ALT 13*  ALKPHOS 142*  BILITOT 0.5   Estimated Creatinine Clearance: 117.9 mL/min (by C-G formula based on Cr of 0.73).   No results for input(s): GLUCAP in the last 72 hours.  Medical History: Past Medical History  Diagnosis Date  . Bipolar 1 disorder ([place])   . 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 Patient was discharged on 12/10 with soft oral diet. Now re-admitted 12/12 with progressive worsening of abdominal pain, chills, nausea, anorexia, and loose stools and CT shows defect of the lesser curve of the stomach with air bubbles concerning for perforated gastric ulcer, abscess or post-operative seroma.   GI: GERD, gastric ulcers, perf -IR consult for possible perc drain placement. (diet) Albumin 1.7. Poor po intake 2/2 abd pain, but good BM pta.  Endo: BG wnl Lytes: K low- repleted w/ 6 runs, Mg 1.7- replete per transcribed orders with 3g, CoCa 10 (CaxPhos = 37) - at risk for  refeeding. Renal: SCr 0.73, IVF- NS + K20 @ 100 cc/hr.  Pulm: R-pleural effusion with possible thoracentesis 12/13.  Cards: VSS Hepatobil: Alk phos elevated, others wnl-low. Tbili wnl.  Neuro: Bipolar disorder.  ID:  HCAP +/- intraabdominal infection on vancomycin and merrem. LA wnl Best Practices: follow-up TPN Access: PICC placement 12/12 TPN start date: 12/12  Current Nutrition:  NPO  Nutritional Goals:  1900-2000 kCal, 95-115 grams of protein per day   Plan: Start after access established. Initiate Clinimix E 5/15 at 62m/hr (approximate goal 83 mL/hr) and IVFE at 119mhr. Daily multivitamin and trace elements Start sensitive SSI q6h Follow-up TPN labs in AM   JeSloan LeiterPharmD, BCShrewsburyharmacist [phone] 31362/06/2015,12:19 PM

## 2015-06-17 NOTE — Progress Notes (Signed)
Chief Complaint: Patient was seen in consultation today for intraabdominal abscess at the request of Dr. Magnus IvanBlackman  Referring Physician(s): Dr. Magnus IvanBlackman  History of Present Illness: Anita Villarreal is a 44 y.o. female with recent hx of perf gastric ulcer requiring open repair. She was discharged home but now being readmitted with worsening abd pain and fever. CT scan shows evidence of recurrent leak or perforation with multiple fluid collections. IR is asked to percutaneously drain. Chart, PMHx, meds, labs, imaging reviewed.  Past Medical History  Diagnosis Date  . Bipolar 1 disorder (H[facility])   . Manic depressive disorder [facility](HCC)     Past Surgical History  Procedure Laterality Date  . Cholecystectomy    . Appendectomy    . Exploratory laparotomy  06/04/15    Cheree DittoGraham patch repair of gastric ulcer  . Laparotomy N/A 06/04/2015    Procedure: EXPLORATORY LAPAROTOMY WITH REPAIROF GASTRIC ULCER AND BIOPSY OF GASTRIC ULCER;  Surgeon: Harriette Bouillonhomas Cornett, MD;  Location: [facility] OR;  Service: General;  Laterality: N/A;  . Perforated bowel      Allergies: Erythromycin and Sulfa antibiotics  Medications: Prior to Admission medications   Medication Sig Start Date End Date Taking? Authorizing Provider  albuterol (PROVENTIL HFA;VENTOLIN HFA) 108 (90 BASE) MCG/ACT inhaler Inhale 2 puffs into the lungs every 6 (six) hours as needed. Wheezing   Yes Historical Provider, MD  albuterol (PROVENTIL) (2.5 MG/3ML) 0.083% nebulizer solution Take 2.5 mg by nebulization every 6 (six) hours as needed for wheezing.   Yes Historical Provider, MD  budesonide-formoterol (SYMBICORT) 80-4.5 MCG/ACT inhaler Inhale 2 puffs into the lungs 2 (two) times daily.   Yes Historical Provider, MD  dicyclomine (BENTYL) 20 MG tablet Take 20 mg by mouth 4 (four) times daily.   Yes Historical Provider, MD  gabapentin (NEURONTIN) 100 MG capsule Take 100 mg by mouth 2 (two) times daily.   Yes Historical Provider, MD  hydrOXYzine  (ATARAX/VISTARIL) 50 MG tablet Take 50 mg by mouth at bedtime.   Yes Historical Provider, MD  levofloxacin (LEVAQUIN) 750 MG tablet Take 1 tablet (750 mg total) by mouth daily. 06/15/15  Yes Freeman CaldronMichael J Jeffery, PA-C  lithium carbonate 300 MG capsule Take 300 mg by mouth 2 (two) times daily.   Yes Historical Provider, MD  methocarbamol (ROBAXIN) 500 MG tablet Take 2 tablets (1,000 mg total) by mouth every 8 (eight) hours as needed for muscle spasms (or pain). 06/15/15  Yes Freeman CaldronMichael J Jeffery, PA-C  oxyCODONE-acetaminophen (PERCOCET) 7.5-325 MG tablet Take 1-2 tablets by mouth every 4 (four) hours as needed. 06/15/15  Yes Freeman CaldronMichael J Jeffery, PA-C  pantoprazole (PROTONIX) 40 MG tablet Take 1 tablet (40 mg total) by mouth 2 (two) times daily before a meal. 06/14/15  Yes Nonie HoyerMegan N Baird, PA-C  promethazine (PHENERGAN) 25 MG tablet Take 25 mg by mouth every 6 (six) hours as needed for nausea or vomiting.   Yes Historical Provider, MD  SUMAtriptan (IMITREX) 100 MG tablet Take 100 mg by mouth every 2 (two) hours as needed. migraines 12/20/14  Yes Historical Provider, MD  ziprasidone (GEODON) 80 MG capsule Take 80 mg by mouth 2 (two) times daily with a meal.   Yes Historical Provider, MD  famotidine (PEPCID) 10 MG tablet Take 1 tablet (10 mg total) by mouth 2 (two) times daily as needed for heartburn or indigestion. Patient not taking: Reported on 06/17/2015 06/14/15   Nonie HoyerMegan N Baird, PA-C     Family History  Problem Relation Age of Onset  .  Diabetes Mother   . CAD Mother   . Aneurysm Maternal Grandmother     Social History   Social History  . Marital Status: Single    Spouse Name: N/A  . Number of Children: N/A  . Years of Education: N/A   Social History Main Topics  . Smoking status: Current Every Day Smoker -- 0.50 packs/day    Types: Cigarettes  . Smokeless tobacco: None  . Alcohol Use: No  . Drug Use: None  . Sexual Activity: Not Asked   Other Topics Concern  . None   Social History  Narrative     Review of Systems: A 12 point ROS discussed and pertinent positives are indicated in the HPI above.  All other systems are negative.  Review of Systems  Vital Signs: BP 116/72 mmHg  Pulse 100  Temp(Src) 99.1 F (37.3 C) (Rectal)  Resp 18  Ht 5' 8.25" (1.734 m)  Wt 255 lb (115.667 kg)  BMI 38.47 kg/m2  SpO2 96%  LMP 04/04/2015  Physical Exam  Constitutional: She is oriented to person, place, and time. She appears well-developed. She appears distressed.  HENT:  Head: Normocephalic.  Mouth/Throat: Oropharynx is clear and moist.  NGT intact to LWS, green bilious output  Neck: Normal range of motion. No tracheal deviation present.  Cardiovascular: Regular rhythm and normal heart sounds.   Tachycardic  Pulmonary/Chest: Effort normal and breath sounds normal. No respiratory distress.  Abdominal: Soft. There is tenderness.  Neurological: She is alert and oriented to person, place, and time.  Psychiatric: She has a normal mood and affect.    Mallampati Score:  MD Evaluation Airway: WNL Heart: WNL Abdomen: WNL Chest/ Lungs: WNL ASA  Classification: 2 Mallampati/Airway Score: Two  Imaging:   Ct Abdomen Pelvis W Contrast  06/17/2015  CLINICAL DATA:  Diffuse abdominal pain. Prior abdominal surgery 06/04/2015. EXAM: CT ABDOMEN AND PELVIS WITH CONTRAST TECHNIQUE: Multidetector CT imaging of the abdomen and pelvis was performed using the standard protocol following bolus administration of intravenous contrast. CONTRAST:  OMNIPAQUE IOHEXOL 300 MG/ML  SOLN COMPARISON:  06/12/2015 FINDINGS: Lower chest: Small right pleural effusion. Right lower lobe airspace disease likely reflecting compressive atelectasis. Normal heart size. Hepatobiliary: Normal liver. Prior cholecystectomy. No intrahepatic or extrahepatic biliary ductal dilatation. Pancreas: Normal. Spleen: Normal. Adrenals/Urinary Tract: Normal adrenal glands. Normal kidneys. No urolithiasis or obstructive  uropathy. Partially decompressed, normal bladder. Stomach/Bowel: There is a defect in the wall of the lesser curvature of the stomach with bubbles of air traversing outside of the stomach most consistent with a perforated gastric ulcer. Multiple fluid-filled loops of small bowel with mild small bowel dilatation. No colonic dilatation. Prior appendectomy. No pneumatosis, pneumoperitoneum or portal venous gas. Interval removal of surgical drains. Small amount of perihepatic free fluid. No pneumoperitoneum, pneumatosis or portal venous gas. Postsurgical changes in the anterior abdominal wall from midline laparotomy. Vascular/Lymphatic: Normal caliber abdominal aorta. No abdominal or pelvic lymphadenopathy. Reproductive: Normal uterus. Bilateral tubal ligation. Cystic structures in the adnexa bilaterally which may reflect hydrosalpinx. Other: Intraperitoneal 3.2 x 9.5 x 7.3 cm fluid collection along along the left anterior abdominal wall most concerning for abscess or postoperative seroma. There is severe nodularity and small areas of fluid collection within the left side of the omentum. There is a 3.9 x 7.5 cm fluid collection along the fundus of the stomach in which may reflect a postoperative seroma versus an abscess. There is a second fluid collection more inferiorly measuring 4.7 x 5.3 cm. Mild body  wall edema. Musculoskeletal: No aggressive lytic or sclerotic osseous lesion. No acute osseous abnormality. IMPRESSION: 1. Defect in the wall of the lesser curvature of the stomach with bubbles of air traversing outside of the stomach most consistent with a perforated gastric ulcer. 2. Intraperitoneal 3.2 x 9.5 x 7.3 cm fluid collection along along the left anterior abdominal wall most concerning for abscess or postoperative seroma. Severe nodularity and small areas of fluid collection within the left omentum concerning for multiple small abscesses. 3. Small right pleural effusion with airspace disease likely reflecting  compressive atelectasis. Critical Value/emergent results were called by telephone at the time of interpretation on 06/17/2015 at 10:10 am to Dr. Jaci Carrel , who verbally acknowledged these results. Electronically Signed   By: Elige Ko   On: 06/17/2015 10:13     Labs:  CBC:  Recent Labs  06/13/15 0445 06/14/15 1610 06/15/15 0358 06/17/15 0428  WBC 19.1* 16.6* 17.4* 15.0*  HGB 9.0* 8.7* 8.6* 7.7*  HCT 26.2* 24.7* 24.6* 22.7*  PLT 486* 516* 534* 597*    COAGS:  Recent Labs  06/17/15 1148  INR 1.50*  APTT 41*    BMP:  Recent Labs  06/12/15 0551 06/13/15 0445 06/14/15 0608 06/17/15 0428  NA 142 141 140 138  K 3.2* 3.2* 3.3* 2.9*  CL 112* 109 108 106  CO2 GLUCOSE 115* 92 109* 106*  BUN <5* <5* <5* <5*  CALCIUM 7.8* 8.0* 7.7* 7.9*  CREATININE 0.80 0.77 0.69 0.73  GFRNONAA >60 >60 >60 >60  GFRAA >60 >60 >60 >60    LIVER FUNCTION TESTS:  Recent Labs  06/04/15 0254 06/17/15 0428  BILITOT 0.3 0.5  AST 24 21  ALT 14 13*  ALKPHOS 99 142*  PROT 7.0 5.8*  ALBUMIN 3.5 1.7*    Assessment and Plan: Intraabdominal perigastric abscesses/fluid collections concerning for recurrent gastric perf/leak Plan for CT guided drainage of collection(s) Risks and Benefits discussed with the patient including bleeding, infection, damage to adjacent structures, bowel perforation/fistula connection, and sepsis. All of the patient's questions were answered, patient is agreeable to proceed. Consent signed and in chart.    Thank you for this interesting consult. A copy of this report was sent to the requesting provider on this date.  SignedBrayton El 06/17/2015, 2:14 PM   I spent a total of 20 Minutes  in face to face in clinical consultation, greater than 50% of which was counseling/coordinating care for percutaneous drainage of intra-abdominal collections.

## 2015-06-17 NOTE — H&P (Signed)
[facility] Admission Note  Anita Villarreal 1970-11-27  [id number].    Requesting MD: Dr. Betsey Holiday Chief Complaint/Reason for Consult: Pneumonia and Intra-abdominal infection  HPI:  44 y/o AA female with PMH Bipolar/MDD, known to our service after an admission between 06/04/15 to 06/15/15 for perforated gastric ulcer at the lesser curve with purulent contamination.  She underwent Ex Lap, closure of gastric ulcer with omental patch drain placement by Dr. Brantley Stage on 06/04/15.  She was discharged home on Levaquin for pneumonia which she says she's been taking but she could not tell me the dose or frequency.  She was not able to take any of her home meds today.  Since discharge her pain has gradually worsening, now 10/10 aching pain.  Radicular pain over lower abdomen as well.  It was so severe today that her family called the ambulance to have her brought to the hospital.  She c/o SOB, chills, nausea, anorexia, loose stools, RUQ and right sided abdominal pain, leg swelling, and fatigue.  She's been having good flatus and BM's.  Denies CP, fever, dysuria, headache, weakness.    CT shows defect of the lesser curve of the stomach with air bubbles concerning for perforated gastric ulcer.  Intraperitoneal fluid collection 3.2 x 9.5 x 7.3cm along anterior abdominal wall, 3.9 x 7.7cm collection along the fundus of the stomach, a second fluid collection more inferiorly 4.7 x 5.3cm, multiple smaller fluid collections within the left omentum concerning for abscesses, and pleural effusions.  CBC is 15.0, improved since discharge at 17.4, K is 2.9, Hgb is 7.7/Hct 22.7.     ROS: All systems reviewed and otherwise negative except for as above   No family history on file.  Past Medical History  Diagnosis Date  . Bipolar 1 disorder ([place])   . Manic depressive disorder [facility])     Past Surgical History  Procedure Laterality Date  . Cholecystectomy    . Appendectomy    . Exploratory laparotomy   06/04/15    Phillip Heal patch repair of gastric ulcer  . Laparotomy N/A 06/04/2015    Procedure: EXPLORATORY LAPAROTOMY WITH REPAIROF GASTRIC ULCER AND BIOPSY OF GASTRIC ULCER;  Surgeon: Erroll Luna, MD;  Location: [place];  Service: General;  Laterality: N/A;  . Perforated bowel      Social History:  reports that she has quit smoking. She does not have any smokeless tobacco history on file. She reports that she does not drink alcohol. Her drug history is not on file.  Allergies:  Allergies  Allergen Reactions  . Erythromycin Other (See Comments)    Tongue swelling   . Sulfa Antibiotics     swelling     (Not in a hospital admission)  Blood pressure 103/67, pulse 98, temperature 99.1 F (37.3 C), temperature source Rectal, resp. rate 18, height 5' 8.25" (1.734 m), weight 115.667 kg (255 lb), last menstrual period 04/04/2015, SpO2 99 %. Physical Exam: General: pleasant, WD/WN AA female who is laying in bed in NAD HEENT: head is normocephalic, atraumatic.  Sclera are noninjected.  PERRL.  Ears and nose without any masses or lesions.  Mouth is pink and moist Heart: regular, rate, and rhythm.  No obvious murmurs, gallops, or rubs noted.  Palpable pedal pulses bilaterally Lungs: Course breath sounds, poor effort, difficult to hear breath sounds, do not hear any wheezing or rhonchi Abd: Obese, soft, distended, greatly tender in right side of abdomen (RUQ/RLQ), some tenderness in epigastrium and suprapubic, less tenderness over left side of abdomen.  +  BS, no masses, hernias, or organomegaly.  Wound vac in place with sanguinous drainage and some brown drainage noted in canister, no frankly purulent MS: all 4 extremities show no deformity, cyanosis, or clubbing, but b/l LE edema Skin: warm and dry with no masses, lesions, or rashes Psych: A&Ox3 with an appropriate affect. Neuro:  No localized weakness   Results for orders placed or performed during the hospital encounter of 06/17/15 (from the  past 48 hour(s))  Lipase, blood     Status: None   Collection Time: 06/17/15  4:28 AM  Result Value Ref Range   Lipase 36 11 - 51 U/L  Comprehensive metabolic panel     Status: Abnormal   Collection Time: 06/17/15  4:28 AM  Result Value Ref Range   Sodium 138 135 - 145 mmol/L   Potassium 2.9 (L) 3.5 - 5.1 mmol/L   Chloride 106 101 - 111 mmol/L   CO2 24 22 - 32 mmol/L   Glucose, Bld 106 (H) 65 - 99 mg/dL   BUN <5 (L) 6 - 20 mg/dL   Creatinine, Ser 0.73 0.44 - 1.00 mg/dL   Calcium 7.9 (L) 8.9 - 10.3 mg/dL   Total Protein 5.8 (L) 6.5 - 8.1 g/dL   Albumin 1.7 (L) 3.5 - 5.0 g/dL   AST 21 15 - 41 U/L   ALT 13 (L) 14 - 54 U/L   Alkaline Phosphatase 142 (H) 38 - 126 U/L   Total Bilirubin 0.5 0.3 - 1.2 mg/dL   GFR calc non Af Amer >60 >60 mL/min   GFR calc Af Amer >60 >60 mL/min    Comment: (NOTE) The eGFR has been calculated using the CKD EPI equation. This calculation has not been validated in all clinical situations. eGFR's persistently <60 mL/min signify possible Chronic Kidney Disease.    Anion gap 8 5 - 15  CBC     Status: Abnormal   Collection Time: 06/17/15  4:28 AM  Result Value Ref Range   WBC 15.0 (H) 4.0 - 10.5 K/uL   RBC 2.71 (L) 3.87 - 5.11 MIL/uL   Hemoglobin 7.7 (L) 12.0 - 15.0 g/dL   HCT 22.7 (L) 36.0 - 46.0 %   MCV 83.8 78.0 - 100.0 fL   MCH 28.4 26.0 - 34.0 pg   MCHC 33.9 30.0 - 36.0 g/dL   RDW 17.0 (H) 11.5 - 15.5 %   Platelets 597 (H) 150 - 400 K/uL  Brain natriuretic peptide     Status: None   Collection Time: 06/17/15  4:28 AM  Result Value Ref Range   B Natriuretic Peptide 56.5 0.0 - 100.0 pg/mL  Urinalysis, Routine w reflex microscopic (not at [facility])     Status: Abnormal   Collection Time: 06/17/15  4:45 AM  Result Value Ref Range   Color, Urine YELLOW YELLOW   APPearance CLOUDY (A) CLEAR   Specific Gravity, Urine 1.017 1.005 - 1.030   pH 6.5 5.0 - 8.0   Glucose, UA NEGATIVE NEGATIVE mg/dL   Hgb urine dipstick NEGATIVE NEGATIVE   Bilirubin Urine  NEGATIVE NEGATIVE   Ketones, ur NEGATIVE NEGATIVE mg/dL   Protein, ur NEGATIVE NEGATIVE mg/dL   Nitrite NEGATIVE NEGATIVE   Leukocytes, UA NEGATIVE NEGATIVE    Comment: MICROSCOPIC NOT DONE ON URINES WITH NEGATIVE PROTEIN, BLOOD, LEUKOCYTES, NITRITE, OR GLUCOSE <1000 mg/dL.  Pregnancy, urine     Status: None   Collection Time: 06/17/15  4:45 AM  Result Value Ref Range   Preg Test, Ur NEGATIVE  NEGATIVE    Comment:        THE SENSITIVITY OF THIS METHODOLOGY IS >20 mIU/mL.   POC occult blood, ED RN will collect     Status: None   Collection Time: 06/17/15  6:14 AM  Result Value Ref Range   Fecal Occult Bld NEGATIVE NEGATIVE  I-Stat CG4 Lactic Acid, ED     Status: None   Collection Time: 06/17/15  7:52 AM  Result Value Ref Range   Lactic Acid, Venous 0.63 0.5 - 2.0 mmol/L   Ct Abdomen Pelvis W Contrast  06/17/2015  CLINICAL DATA:  Diffuse abdominal pain. Prior abdominal surgery 06/04/2015. EXAM: CT ABDOMEN AND PELVIS WITH CONTRAST TECHNIQUE: Multidetector CT imaging of the abdomen and pelvis was performed using the standard protocol following bolus administration of intravenous contrast. CONTRAST:  122mL OMNIPAQUE IOHEXOL 300 MG/ML  SOLN COMPARISON:  06/12/2015 FINDINGS: Lower chest: Small right pleural effusion. Right lower lobe airspace disease likely reflecting compressive atelectasis. Normal heart size. Hepatobiliary: Normal liver. Prior cholecystectomy. No intrahepatic or extrahepatic biliary ductal dilatation. Pancreas: Normal. Spleen: Normal. Adrenals/Urinary Tract: Normal adrenal glands. Normal kidneys. No urolithiasis or obstructive uropathy. Partially decompressed, normal bladder. Stomach/Bowel: There is a defect in the wall of the lesser curvature of the stomach with bubbles of air traversing outside of the stomach most consistent with a perforated gastric ulcer. Multiple fluid-filled loops of small bowel with mild small bowel dilatation. No colonic dilatation. Prior appendectomy. No  pneumatosis, pneumoperitoneum or portal venous gas. Interval removal of surgical drains. Small amount of perihepatic free fluid. No pneumoperitoneum, pneumatosis or portal venous gas. Postsurgical changes in the anterior abdominal wall from midline laparotomy. Vascular/Lymphatic: Normal caliber abdominal aorta. No abdominal or pelvic lymphadenopathy. Reproductive: Normal uterus. Bilateral tubal ligation. Cystic structures in the adnexa bilaterally which may reflect hydrosalpinx. Other: Intraperitoneal 3.2 x 9.5 x 7.3 cm fluid collection along along the left anterior abdominal wall most concerning for abscess or postoperative seroma. There is severe nodularity and small areas of fluid collection within the left side of the omentum. There is a 3.9 x 7.5 cm fluid collection along the fundus of the stomach in which may reflect a postoperative seroma versus an abscess. There is a second fluid collection more inferiorly measuring 4.7 x 5.3 cm. Mild body wall edema. Musculoskeletal: No aggressive lytic or sclerotic osseous lesion. No acute osseous abnormality. IMPRESSION: 1. Defect in the wall of the lesser curvature of the stomach with bubbles of air traversing outside of the stomach most consistent with a perforated gastric ulcer. 2. Intraperitoneal 3.2 x 9.5 x 7.3 cm fluid collection along along the left anterior abdominal wall most concerning for abscess or postoperative seroma. Severe nodularity and small areas of fluid collection within the left omentum concerning for multiple small abscesses. 3. Small right pleural effusion with airspace disease likely reflecting compressive atelectasis. Critical Value/emergent results were called by telephone at the time of interpretation on 06/17/2015 at 10:10 am to Dr. Joseph Berkshire , who verbally acknowledged these results. Electronically Signed   By: Kathreen Devoid   On: 06/17/2015 10:13   Dg Abd Acute W/chest  06/17/2015  CLINICAL DATA:  Acute onset of generalized  abdominal pain, nausea and vomiting. Initial encounter. EXAM: DG ABDOMEN ACUTE W/ 1V CHEST COMPARISON:  Chest radiograph performed 06/13/2015 FINDINGS: The lungs are well-aerated. A small to moderate right-sided pleural effusion is noted, with right basilar airspace opacification. This is mildly worsened from the prior study, concerning for pneumonia or asymmetric pulmonary edema. Mild left  basilar atelectasis is noted. Vascular congestion is noted. There is no evidence of pneumothorax. The cardiomediastinal silhouette is borderline enlarged. The visualized bowel gas pattern is unremarkable. Scattered stool and air are seen within the colon; there is no evidence of small bowel dilatation to suggest obstruction. No free intra-abdominal air is identified on the provided upright view. Clips are noted within the right upper quadrant, reflecting prior cholecystectomy. No acute osseous abnormalities are seen; the sacroiliac joints are unremarkable in appearance. Bilateral tubal ligation clips are noted. IMPRESSION: 1. Small to moderate right-sided pleural effusion, with right basilar airspace opacification. This is mildly worsened from the prior study, concerning for pneumonia or asymmetric pulmonary edema. Mild left basilar atelectasis noted. 2. Vascular congestion and borderline cardiomegaly noted. 3. Unremarkable bowel gas pattern; no free intra-abdominal air seen. Electronically Signed   By: Garald Balding M.D.   On: 06/17/2015 06:10      Assessment/Plan Perforated lesser curve gastric ulcer with purulent contamination of the abdominal cavity POD #13 s/p Ex Lap with omental patch repair, drain placement Intraabdominal fluid collections concerning for abscess -Admit to [facility], will get IR consult to see if they can aspiration and/drain fluid collections, Dr. Vernard Gambles to eval -No plans for urgent surgery given she is now 2 weeks out from surgery, she would hard to get back into her abdomen due to adhesions.   Hopefully with antibiotics, bowel rest, IR drainage, and time this will heal. -Will get hospitalist to consult on pneumonia, bipolar, LE edema, electrolyte abnormalities, and anemia -NPO, NG tube, bowel rest, IVF, pain control, antiemetics (Vanc/zosyn - for intraabdominal infection and HCAP Day #1) -Resume PICC/TPN -Hold off on lovenox for now to see if we can get IR to place drains -Ambulate and IS -SCD's -Will eval wound at [facility] change.  PCM - start TPN Bipolar -Switched PO meds back to recommended IV/IM psych meds as recommended by Dr. Louretta Shorten last admission -This had a large effect on her compliance with medical care during her last admission Hypokalemia -This was a chronic issue on last admission, check mg Anemia of unknown source Pleural Effusion LE Edema HCAP - Vanc/zosyn started by EDP  Appreciate medicine helping [place] to manage her   Nat Christen, [facility] 06/17/2015, 11:07 AM Pager: [phone]

## 2015-06-17 NOTE — Sedation Documentation (Signed)
Pt states LMP was 03/2015. States no way she is pregnant

## 2015-06-17 NOTE — ED Provider Notes (Addendum)
Patient presented to the ER with shortness of breath and right-sided abdominal pain. Patient was recently hospitalized for perforated gastric ulcer. She returns with increasing pain as well as the shortness of breath. Patient is not significantly hypoxic at arrival.  Face to face Exam: HEENT - PERRLA Lungs - CTAB Heart - RRR, no M/R/G Abd - S/ND; mild diffuse tenderness Neuro - alert, oriented x3 Muscular skeletal - diffuse lower extremity edema, pitting in nature  Plan: Presents to the ER for evaluation of abdominal pain and shortness of breath. Patient was treated for perforated gastric ulcer with surgical repair. During her hospital stay she was on Rocephin and Flagyl empirically. She returns with increasing shortness of breath. Plain film x-ray today shows evidence of right-sided pneumonia. She does not have evidence of congestive heart failure, as her BNP is 50. She does have lower extremity edema, bilateral and symmetric. Not consistent with DVT.  Patient will undergo CT abdomen and pelvis to ensure that there are no postoperative complications. Hemoglobin has dropped slightly since discharge, but Hemoccult was negative. Doubt ongoing bleeding from gastric ulcer. As she was already on Rocephin for an extended period of time in the hospital, will treat with broad-spectrum healthcare associated coverage. Will sign out to oncoming ER physician to follow-up on CT.  Gilda Creasehristopher J Emauri Krygier, MD 06/17/15 [id number]  Gilda Creasehristopher J Henrick Mcgue, MD 06/17/15 [phone]

## 2015-06-17 NOTE — Progress Notes (Addendum)
ANTIBIOTIC CONSULT NOTE - INITIAL  Pharmacy Consult for vanc/zosyn Indication: HCAP  Allergies  Allergen Reactions  . Erythromycin Other (See Comments)    Tongue swelling   . Sulfa Antibiotics     swelling    Patient Measurements: Height: 5' 8.25" (173.4 cm) Weight: 255 lb (115.667 kg) IBW/kg (Calculated) : 64.48  Vital Signs: Temp: 99.1 F (37.3 C) (12/12 0727) Temp Source: Rectal (12/12 0727) BP: 119/79 mmHg (12/12 0746) Pulse Rate: 94 (12/12 0746) Intake/Output from previous day:   Intake/Output from this shift:    Labs:  Recent Labs  06/15/15 0358 06/17/15 0428  WBC 17.4* 15.0*  HGB 8.6* 7.7*  PLT 534* 597*  CREATININE  --  0.73   Estimated Creatinine Clearance: 117.9 mL/min (by C-G formula based on Cr of 0.73). No results for input(s): VANCOTROUGH, VANCOPEAK, VANCORANDOM, GENTTROUGH, GENTPEAK, GENTRANDOM, TOBRATROUGH, TOBRAPEAK, TOBRARND, AMIKACINPEAK, AMIKACINTROU, AMIKACIN in the last 72 hours.   Microbiology: Recent Results (from the past 720 hour(s))  MRSA PCR Screening     Status: None   Collection Time: 06/04/15  9:36 AM  Result Value Ref Range Status   MRSA by PCR NEGATIVE NEGATIVE Final    Comment:        The GeneXpert MRSA Assay (FDA approved for NASAL specimens only), is one component of a comprehensive MRSA colonization surveillance program. It is not intended to diagnose MRSA infection nor to guide or monitor treatment for MRSA infections.     Medical History: Past Medical History  Diagnosis Date  . Bipolar 1 disorder ([facility])   . Manic depressive disorder ([facility])     Medications:  Scheduled:  .  morphine injection  4 mg Intravenous Once   Assessment: 44 YO female recently discharged on Saturday after having perforated gastric ulcer and HCAP. Previously received rocephin/flagyl. Pt presented today w/ abdominal pain and SOB. Pharmacy consulted to dose vanc/zosyn for HCAP.  WBC elevated to 15, afebrile, LA 0.63. SCr 0.73,  normalized CrCl ~128 ml/min.  Goal of Therapy:  Vancomycin trough level 15-20 mcg/ml  Plan:  Vanc 2g IV x1, then 1 g IV q8h Zosyn 3.375 g IV q8h 4 h infusion Monitor renal function and clinical progression, cultures [facility] check tomorrow d/t q8h dosing   Greggory Stallionristy Reyes, PharmD Clinical Pharmacy Resident Pager # [phone] 06/17/2015 8:24 AM  Addendum: Changing zosyn to meropenem.   Plan: - Meropenem 1gm IV Q8H - F/u renal fxn, C&S, clinical status  Lysle PearlRachel Yazmina Pareja, PharmD, BCPS Pager # [phone] 06/17/2015 12:39 PM

## 2015-06-17 NOTE — ED Notes (Signed)
Discharged from here Saturday after having a perforated bowel and Hospital acquired pneumonia.  Reports being SOB in the hospital but thought it would get better when she got home with the antibiotics.  Large midline incision with wound vac in place to abdomen.  O2 sat 96% on RA.  Low grade tempt of 99.1.  Reporting pain in abdomen at 10/10.

## 2015-06-17 NOTE — ED Notes (Addendum)
Pt ambulated to the bathroom with ease 

## 2015-06-18 ENCOUNTER — Observation Stay (HOSPITAL_COMMUNITY): Payer: Medicaid Other

## 2015-06-18 ENCOUNTER — Encounter (HOSPITAL_COMMUNITY): Payer: Self-pay | Admitting: General Practice

## 2015-06-18 DIAGNOSIS — Z79899 Other long term (current) drug therapy: Secondary | ICD-10-CM | POA: Diagnosis not present

## 2015-06-18 DIAGNOSIS — Z9119 Patient's noncompliance with other medical treatment and regimen: Secondary | ICD-10-CM | POA: Diagnosis not present

## 2015-06-18 DIAGNOSIS — E440 Moderate protein-calorie malnutrition: Secondary | ICD-10-CM | POA: Diagnosis present

## 2015-06-18 DIAGNOSIS — E876 Hypokalemia: Secondary | ICD-10-CM | POA: Diagnosis not present

## 2015-06-18 DIAGNOSIS — R0600 Dyspnea, unspecified: Secondary | ICD-10-CM | POA: Diagnosis not present

## 2015-06-18 DIAGNOSIS — T814XXA Infection following a procedure, initial encounter: Secondary | ICD-10-CM | POA: Diagnosis not present

## 2015-06-18 DIAGNOSIS — K251 Acute gastric ulcer with perforation: Secondary | ICD-10-CM | POA: Diagnosis not present

## 2015-06-18 DIAGNOSIS — E669 Obesity, unspecified: Secondary | ICD-10-CM | POA: Diagnosis present

## 2015-06-18 DIAGNOSIS — Z7951 Long term (current) use of inhaled steroids: Secondary | ICD-10-CM | POA: Diagnosis not present

## 2015-06-18 DIAGNOSIS — Y95 Nosocomial condition: Secondary | ICD-10-CM | POA: Diagnosis present

## 2015-06-18 DIAGNOSIS — J90 Pleural effusion, not elsewhere classified: Secondary | ICD-10-CM | POA: Diagnosis present

## 2015-06-18 DIAGNOSIS — K651 Peritoneal abscess: Secondary | ICD-10-CM | POA: Diagnosis not present

## 2015-06-18 DIAGNOSIS — J9811 Atelectasis: Secondary | ICD-10-CM | POA: Diagnosis present

## 2015-06-18 DIAGNOSIS — Z6839 Body mass index (BMI) 39.0-39.9, adult: Secondary | ICD-10-CM | POA: Diagnosis not present

## 2015-06-18 DIAGNOSIS — F17210 Nicotine dependence, cigarettes, uncomplicated: Secondary | ICD-10-CM | POA: Diagnosis present

## 2015-06-18 DIAGNOSIS — D638 Anemia in other chronic diseases classified elsewhere: Secondary | ICD-10-CM | POA: Diagnosis present

## 2015-06-18 DIAGNOSIS — F3160 Bipolar disorder, current episode mixed, unspecified: Secondary | ICD-10-CM | POA: Diagnosis present

## 2015-06-18 DIAGNOSIS — Y838 Other surgical procedures as the cause of abnormal reaction of the patient, or of later complication, without mention of misadventure at the time of the procedure: Secondary | ICD-10-CM | POA: Diagnosis present

## 2015-06-18 DIAGNOSIS — J189 Pneumonia, unspecified organism: Secondary | ICD-10-CM | POA: Diagnosis not present

## 2015-06-18 DIAGNOSIS — E039 Hypothyroidism, unspecified: Secondary | ICD-10-CM | POA: Diagnosis present

## 2015-06-18 LAB — CBC
HCT: 23.7 % — ABNORMAL LOW (ref 36.0–46.0)
Hemoglobin: 8.2 g/dL — ABNORMAL LOW (ref 12.0–15.0)
MCH: 29.5 pg (ref 26.0–34.0)
MCHC: 34.6 g/dL (ref 30.0–36.0)
MCV: 85.3 fL (ref 78.0–100.0)
PLATELETS: 748 10*3/uL — AB (ref 150–400)
RBC: 2.78 MIL/uL — ABNORMAL LOW (ref 3.87–5.11)
RDW: 17.4 % — ABNORMAL HIGH (ref 11.5–15.5)
WBC: 15.6 10*3/uL — ABNORMAL HIGH (ref 4.0–10.5)

## 2015-06-18 LAB — DIFFERENTIAL
BASOS PCT: 0 %
Basophils Absolute: 0 10*3/uL (ref 0.0–0.1)
EOS ABS: 0.2 10*3/uL (ref 0.0–0.7)
Eosinophils Relative: 1 %
Lymphocytes Relative: 24 %
Lymphs Abs: 3.7 10*3/uL (ref 0.7–4.0)
MONO ABS: 1.7 10*3/uL — AB (ref 0.1–1.0)
Monocytes Relative: 11 %
NEUTROS ABS: 10 10*3/uL — AB (ref 1.7–7.7)
NEUTROS PCT: 64 %

## 2015-06-18 LAB — MAGNESIUM: MAGNESIUM: 2.1 mg/dL (ref 1.7–2.4)

## 2015-06-18 LAB — COMPREHENSIVE METABOLIC PANEL
ALK PHOS: 138 U/L — AB (ref 38–126)
ALT: 14 U/L (ref 14–54)
ANION GAP: 12 (ref 5–15)
AST: 24 U/L (ref 15–41)
Albumin: 1.8 g/dL — ABNORMAL LOW (ref 3.5–5.0)
BUN: <5 mg/dL — ABNORMAL LOW (ref 6–20)
CALCIUM: 8 mg/dL — AB (ref 8.9–10.3)
CO2: 24 mmol/L (ref 22–32)
Chloride: 106 mmol/L (ref 101–111)
Creatinine, Ser: 0.82 mg/dL (ref 0.44–1.00)
GFR calc non Af Amer: >60 mL/min (ref >60)
Glucose, Bld: 90 mg/dL (ref 65–99)
Potassium: 3.3 mmol/L — ABNORMAL LOW (ref 3.5–5.1)
SODIUM: 142 mmol/L (ref 135–145)
TOTAL PROTEIN: 6.3 g/dL — AB (ref 6.5–8.1)
Total Bilirubin: 0.6 mg/dL (ref 0.3–1.2)

## 2015-06-18 LAB — PHOSPHORUS: PHOSPHORUS: 3.5 mg/dL (ref 2.5–4.6)

## 2015-06-18 LAB — TRIGLYCERIDES: Triglycerides: 99 mg/dL (ref <150)

## 2015-06-18 LAB — GLUCOSE, CAPILLARY
GLUCOSE-CAPILLARY: 107 mg/dL — AB (ref 65–99)
Glucose-Capillary: 88 mg/dL (ref 65–99)

## 2015-06-18 LAB — PREALBUMIN: Prealbumin: 7.9 mg/dL — ABNORMAL LOW (ref 18–38)

## 2015-06-18 MED ORDER — TRACE MINERALS CR-CU-MN-SE-ZN 10-1000-500-60 MCG/ML IV SOLN
INTRAVENOUS | Status: AC
  Administered 2015-06-18: 17:00:00 via INTRAVENOUS
  Filled 2015-06-18: qty 720

## 2015-06-18 MED ORDER — SODIUM CHLORIDE 0.9 % IV SOLN
INTRAVENOUS | Status: AC
  Administered 2015-06-18: 08:00:00 via INTRAVENOUS

## 2015-06-18 MED ORDER — CETYLPYRIDINIUM CHLORIDE 0.05 % MT LIQD: 7.0000 mL | Freq: Two times a day (BID) | OROMUCOSAL | Status: DC

## 2015-06-18 MED ORDER — POTASSIUM CHLORIDE CRYS ER 20 MEQ PO TBCR: 40.0000 meq | EXTENDED_RELEASE_TABLET | ORAL | Status: DC | PRN

## 2015-06-18 MED ORDER — FAT EMULSION 20 % IV EMUL
240.0000 mL | INTRAVENOUS | Status: AC
  Administered 2015-06-18: 240 mL via INTRAVENOUS
  Filled 2015-06-18: qty 250

## 2015-06-18 MED ORDER — ZIPRASIDONE MESYLATE 20 MG IM SOLR
20.0000 mg | INTRAMUSCULAR | Status: DC | PRN
Start: 2015-06-18 — End: 2015-06-21
  Filled 2015-06-18: qty 20

## 2015-06-18 MED ORDER — POTASSIUM CHLORIDE 10 MEQ/50ML IV SOLN
10.0000 meq | INTRAVENOUS | Status: AC
  Administered 2015-06-18 (×4): 10 meq via INTRAVENOUS
  Filled 2015-06-18 (×4): qty 50

## 2015-06-18 MED ORDER — CETYLPYRIDINIUM CHLORIDE 0.05 % MT LIQD
7.0000 mL | Freq: Two times a day (BID) | OROMUCOSAL | Status: DC
  Administered 2015-06-18 – 2015-06-24 (×11): 7 mL via OROMUCOSAL

## 2015-06-18 MED ORDER — ENOXAPARIN SODIUM 40 MG/0.4ML ~~LOC~~ SOLN
40.0000 mg | Freq: Every day | SUBCUTANEOUS | Status: DC
  Administered 2015-06-18 – 2015-06-19 (×2): 40 mg via SUBCUTANEOUS
  Filled 2015-06-18 (×2): qty 0.4

## 2015-06-18 MED ORDER — CHLORHEXIDINE GLUCONATE 0.12 % MT SOLN
15.0000 mL | Freq: Two times a day (BID) | OROMUCOSAL | Status: DC
  Administered 2015-06-18 – 2015-06-26 (×15): 15 mL via OROMUCOSAL
  Filled 2015-06-18 (×14): qty 15

## 2015-06-18 MED ORDER — TRACE MINERALS CR-CU-MN-SE-ZN 10-1000-500-60 MCG/ML IV SOLN
INTRAVENOUS | Status: AC
  Administered 2015-06-18: 11:00:00 via INTRAVENOUS
  Filled 2015-06-18: qty 720

## 2015-06-18 MED ORDER — SODIUM CHLORIDE 0.9 % IJ SOLN
10.0000 mL | INTRAMUSCULAR | Status: DC | PRN
  Administered 2015-06-22 – 2015-06-24 (×3): 10 mL
  Administered 2015-06-26: 30 mL
  Administered 2015-06-26: 10 mL
  Filled 2015-06-18 (×5): qty 40

## 2015-06-18 MED ORDER — CHLORHEXIDINE GLUCONATE 0.12 % MT SOLN: 15.0000 mL | Freq: Two times a day (BID) | OROMUCOSAL | Status: DC

## 2015-06-18 NOTE — Progress Notes (Addendum)
Initial Nutrition Assessment  DOCUMENTATION CODES:   Obesity unspecified  INTERVENTION:   -TPN management per pharmacy -RD will follow for diet advancement  NUTRITION DIAGNOSIS:   Inadequate oral intake related to altered GI function as evidenced by NPO status.  GOAL:   Patient will meet greater than or equal to 90% of their needs  MONITOR:   Diet advancement, Labs, Weight trends, Skin, I & O's  REASON FOR ASSESSMENT:   Consult New TPN/TNA  ASSESSMENT:   Anita Villarreal is a 44 y.o. female with a Past Medical History of bipolar1, GERD, tobacco use, gastric ulcers and perforations status post laparotomy who presents with persistent worsening right pleural effusion and numerous abdominal seromas/abscesses and persistent gastric perforation   Pt admitted with perforated lesser curve gastric ulcer with purulent contamination of the abdominal cavity and intraabdominal fluid collections concerning for abscess. She is POD #13 s/p Ex Lap with omental patch repair, drain placement.  Pt in sterile procedure at time of visit and surgical team about to evaluate pt. Unable to complete Nutrition-Focused physical exam at this time.   Pt with NGT for decompression. Noted >900 ml output within the past 24 hours per doc flowsheets.   Pt s/p LLQ abscess drain catheter placement on 06/17/15.   Reviewed [facility] note from 06/18/15; pt with full thickness post-op abdominal wound, on wound VAC currently and PTA.   Per pharmacy note, plan to start TPN after access is established. Plan to initiate Clinimix E 5/15 at 2530mL/hr (approximate goal 83 mL/hr) and IVFE at 3510mL/hr (current rate to provide 991 kcals and 36 grams protein, which meets 47% of estimated kcal needs and 31% of estimated protein needs).  Labs reviewed: K: 3.3 (on IV supplementation). Mg and Phos WDL.   Diet Order:  TPN (CLINIMIX-E) Adult TPN (CLINIMIX-E) Adult Diet NPO time specified Except for: Ice Chips  Skin:  Wound (see  comment) (abdominal vac)  Last BM:  06/14/15  Height:   Ht Readings from Last 1 Encounters:  06/17/15 5\' 8"  (1.727 m)    Weight:   Wt Readings from Last 1 Encounters:  06/17/15 259 lb 7.7 oz (117.7 kg)    Ideal Body Weight:  63.6 kg  BMI:  Body mass index is 39.46 kg/(m^2).  Estimated Nutritional Needs:   Kcal:  2100-2300  Protein:  115-130 grams  Fluid:  >2.1 L  EDUCATION NEEDS:   No education needs identified at this time  Aariel Ems A. Mayford KnifeWilliams, RD, LDN, CDE Pager: [phone] After hours Pager: [phone]

## 2015-06-18 NOTE — Consult Note (Signed)
[place] Face-to-Face Psychiatry Consult   Reason for Consult:  Bipolar disorder medication management Referring Physician:  Dr. Edison Pace Patient Identification: Anita Villarreal MRN:  [id number] Principal Diagnosis: Bipolar I disorder, most recent episode mixed [facility]) Diagnosis:   Patient Active Problem List   Diagnosis Date Noted  . Anemia [D64.9] 06/17/2015  . Hypokalemia [E87.6] 06/17/2015  . HCAP (healthcare-associated pneumonia) [J18.9] 06/17/2015  . Pneumonia [J18.9] 06/17/2015  . Intra-abdominal infection [B99.9]   . Abdominal abscess ([place]) [K65.1]   . Bipolar I disorder, most recent episode mixed ([place]) [F31.60] 06/07/2015  . Acute gastric perforation ([facility]) [K25.1] 06/04/2015    Total Time spent with patient: 1 hour  Subjective:   Anita Villarreal is a 44 y.o. female patient admitted with bipolar disorder and abdominal pain.  HPI:  Anita Villarreal is a 44 years old African-American female admitted to [facility] with the abdominal pain which has been gradually worsening since discharged from the hospital on 06/15/2015. Psychiatric consultation requested for psychiatric medication management for the bipolar disorder. Patient is currently on the nothing by mouth and probably might stay the same about a week or so so needed appropriate medication for perioral. I have discussed with the pharmacist who recommended Geodon 10 or 20 mg every 2-4 hours as needed with a maximum dose of 40 mg a day as it was never tried over 40 mg a day according to literature. Patient is currently appeared lying in her bed, sedated with the medications and briefly responded verbally. Patient is known to this provider from her recent [facility] admission for perforated gastric ulcer and pneumonia.  patient was appeared highly manic with the paranoid delusional thoughts at that time. It is not clear patient has received a her antipsychotic medication while she was at home. Patient was able to switch  from the IM medication to oral medication before she went home during recent hospitalization.   Past Psychiatric History: Outpatient psychiatrist services by psychiatric nurse provider Metta Clines. She has no previous [facility] admissions.  Risk to Self: Is patient at risk for suicide?: No Risk to Others:   Prior Inpatient Therapy:   Prior Outpatient Therapy:    Past Medical History:  Past Medical History  Diagnosis Date  . Bipolar 1 disorder ([place])   . Manic depressive disorder [facility])     Past Surgical History  Procedure Laterality Date  . Cholecystectomy    . Appendectomy    . Exploratory laparotomy  06/04/15    Phillip Heal patch repair of gastric ulcer  . Laparotomy N/A 06/04/2015    Procedure: EXPLORATORY LAPAROTOMY WITH REPAIROF GASTRIC ULCER AND BIOPSY OF GASTRIC ULCER;  Surgeon: Erroll Luna, MD;  Location: [facility];  Service: General;  Laterality: N/A;  . Perforated bowel     Family History:  Family History  Problem Relation Age of Onset  . Diabetes Mother   . CAD Mother   . Aneurysm Maternal Grandmother    Family Psychiatric  History: bipolar disorder in her uncle. Social History:  History  Alcohol Use No     History  Drug Use Not on file    Social History   Social History  . Marital Status: Single    Spouse Name: N/A  . Number of Children: N/A  . Years of Education: N/A   Social History Main Topics  . Smoking status: Current Every Day Smoker -- 0.50 packs/day    Types: Cigarettes  . Smokeless tobacco: None  . Alcohol Use: No  . Drug Use:  None  . Sexual Activity: Not Asked   Other Topics Concern  . None   Social History Narrative   Additional Social History:                          Allergies:   Allergies  Allergen Reactions  . Erythromycin Other (See Comments)    Tongue swelling   . Sulfa Antibiotics     swelling    Labs:  Results for orders placed or performed during the hospital encounter of 06/17/15 (from the past 48 hour(s))   Lipase, blood     Status: None   Collection Time: 06/17/15  4:28 AM  Result Value Ref Range   Lipase 36 11 - 51 U/L  Comprehensive metabolic panel     Status: Abnormal   Collection Time: 06/17/15  4:28 AM  Result Value Ref Range   Sodium 138 135 - 145 mmol/L   Potassium 2.9 (L) 3.5 - 5.1 mmol/L   Chloride 106 101 - 111 mmol/L   CO2 24 22 - 32 mmol/L   Glucose, Bld 106 (H) 65 - 99 mg/dL   BUN <5 (L) 6 - 20 mg/dL   Creatinine, Ser 0.73 0.44 - 1.00 mg/dL   Calcium 7.9 (L) 8.9 - 10.3 mg/dL   Total Protein 5.8 (L) 6.5 - 8.1 g/dL   Albumin 1.7 (L) 3.5 - 5.0 g/dL   AST 21 15 - 41 U/L   ALT 13 (L) 14 - 54 U/L   Alkaline Phosphatase 142 (H) 38 - 126 U/L   Total Bilirubin 0.5 0.3 - 1.2 mg/dL   GFR calc non Af Amer >60 >60 mL/min   GFR calc Af Amer >60 >60 mL/min    Comment: (NOTE) The eGFR has been calculated using the CKD EPI equation. This calculation has not been validated in all clinical situations. eGFR's persistently <60 mL/min signify possible Chronic Kidney Disease.    Anion gap 8 5 - 15  CBC     Status: Abnormal   Collection Time: 06/17/15  4:28 AM  Result Value Ref Range   WBC 15.0 (H) 4.0 - 10.5 K/uL   RBC 2.71 (L) 3.87 - 5.11 MIL/uL   Hemoglobin 7.7 (L) 12.0 - 15.0 g/dL   HCT 22.7 (L) 36.0 - 46.0 %   MCV 83.8 78.0 - 100.0 fL   MCH 28.4 26.0 - 34.0 pg   MCHC 33.9 30.0 - 36.0 g/dL   RDW 17.0 (H) 11.5 - 15.5 %   Platelets 597 (H) 150 - 400 K/uL  Brain natriuretic peptide     Status: None   Collection Time: 06/17/15  4:28 AM  Result Value Ref Range   B Natriuretic Peptide 56.5 0.0 - 100.0 pg/mL  Urinalysis, Routine w reflex microscopic (not at [facility])     Status: Abnormal   Collection Time: 06/17/15  4:45 AM  Result Value Ref Range   Color, Urine YELLOW YELLOW   APPearance CLOUDY (A) CLEAR   Specific Gravity, Urine 1.017 1.005 - 1.030   pH 6.5 5.0 - 8.0   Glucose, UA NEGATIVE NEGATIVE mg/dL   Hgb urine dipstick NEGATIVE NEGATIVE   Bilirubin Urine NEGATIVE NEGATIVE    Ketones, ur NEGATIVE NEGATIVE mg/dL   Protein, ur NEGATIVE NEGATIVE mg/dL   Nitrite NEGATIVE NEGATIVE   Leukocytes, UA NEGATIVE NEGATIVE    Comment: MICROSCOPIC NOT DONE ON URINES WITH NEGATIVE PROTEIN, BLOOD, LEUKOCYTES, NITRITE, OR GLUCOSE <1000 mg/dL.  Pregnancy, urine  Status: None   Collection Time: 06/17/15  4:45 AM  Result Value Ref Range   Preg Test, Ur NEGATIVE NEGATIVE    Comment:        THE SENSITIVITY OF THIS METHODOLOGY IS >20 mIU/mL.   POC occult blood, ED RN will collect     Status: None   Collection Time: 06/17/15  6:14 AM  Result Value Ref Range   Fecal Occult Bld NEGATIVE NEGATIVE  I-Stat CG4 Lactic Acid, ED     Status: None   Collection Time: 06/17/15  7:52 AM  Result Value Ref Range   Lactic Acid, Venous 0.63 0.5 - 2.0 mmol/L  Protime-INR     Status: Abnormal   Collection Time: 06/17/15 11:48 AM  Result Value Ref Range   Prothrombin Time 18.2 (H) 11.6 - 15.2 seconds   INR 1.50 (H) 0.00 - 1.49  APTT     Status: Abnormal   Collection Time: 06/17/15 11:48 AM  Result Value Ref Range   aPTT 41 (H) 24 - 37 seconds    Comment:        IF BASELINE aPTT IS ELEVATED, SUGGEST PATIENT RISK ASSESSMENT BE USED TO DETERMINE APPROPRIATE ANTICOAGULANT THERAPY.   Magnesium     Status: None   Collection Time: 06/17/15 12:15 PM  Result Value Ref Range   Magnesium 1.7 1.7 - 2.4 mg/dL  Phosphorus     Status: None   Collection Time: 06/17/15 12:15 PM  Result Value Ref Range   Phosphorus 3.7 2.5 - 4.6 mg/dL  I-Stat CG4 Lactic Acid, ED     Status: None   Collection Time: 06/17/15 12:16 PM  Result Value Ref Range   Lactic Acid, Venous 0.74 0.5 - 2.0 mmol/L  Triglycerides     Status: None   Collection Time: 06/18/15  4:50 AM  Result Value Ref Range   Triglycerides 99 <150 mg/dL  Comprehensive metabolic panel     Status: Abnormal   Collection Time: 06/18/15  4:55 AM  Result Value Ref Range   Sodium 142 135 - 145 mmol/L   Potassium 3.3 (L) 3.5 - 5.1 mmol/L    Chloride 106 101 - 111 mmol/L   CO2 24 22 - 32 mmol/L   Glucose, Bld 90 65 - 99 mg/dL   BUN <5 (L) 6 - 20 mg/dL   Creatinine, Ser 0.82 0.44 - 1.00 mg/dL   Calcium 8.0 (L) 8.9 - 10.3 mg/dL   Total Protein 6.3 (L) 6.5 - 8.1 g/dL   Albumin 1.8 (L) 3.5 - 5.0 g/dL   AST 24 15 - 41 U/L   ALT 14 14 - 54 U/L   Alkaline Phosphatase 138 (H) 38 - 126 U/L   Total Bilirubin 0.6 0.3 - 1.2 mg/dL   GFR calc non Af Amer >60 >60 mL/min   GFR calc Af Amer >60 >60 mL/min    Comment: (NOTE) The eGFR has been calculated using the CKD EPI equation. This calculation has not been validated in all clinical situations. eGFR's persistently <60 mL/min signify possible Chronic Kidney Disease.    Anion gap 12 5 - 15  Prealbumin     Status: Abnormal   Collection Time: 06/18/15  4:55 AM  Result Value Ref Range   Prealbumin 7.9 (L) 18 - 38 mg/dL  Magnesium     Status: None   Collection Time: 06/18/15  4:55 AM  Result Value Ref Range   Magnesium 2.1 1.7 - 2.4 mg/dL  Phosphorus     Status: None  Collection Time: 06/18/15  4:55 AM  Result Value Ref Range   Phosphorus 3.5 2.5 - 4.6 mg/dL  CBC     Status: Abnormal   Collection Time: 06/18/15  4:55 AM  Result Value Ref Range   WBC 15.6 (H) 4.0 - 10.5 K/uL   RBC 2.78 (L) 3.87 - 5.11 MIL/uL   Hemoglobin 8.2 (L) 12.0 - 15.0 g/dL   HCT 23.7 (L) 36.0 - 46.0 %   MCV 85.3 78.0 - 100.0 fL   MCH 29.5 26.0 - 34.0 pg   MCHC 34.6 30.0 - 36.0 g/dL   RDW 17.4 (H) 11.5 - 15.5 %   Platelets 748 (H) 150 - 400 K/uL  Differential     Status: Abnormal   Collection Time: 06/18/15  4:55 AM  Result Value Ref Range   Neutrophils Relative % 64 %   Lymphocytes Relative 24 %   Monocytes Relative 11 %   Eosinophils Relative 1 %   Basophils Relative 0 %   Neutro Abs 10.0 (H) 1.7 - 7.7 K/uL   Lymphs Abs 3.7 0.7 - 4.0 K/uL   Monocytes Absolute 1.7 (H) 0.1 - 1.0 K/uL   Eosinophils Absolute 0.2 0.0 - 0.7 K/uL   Basophils Absolute 0.0 0.0 - 0.1 K/uL   RBC Morphology POLYCHROMASIA  PRESENT     Comment: TARGET CELLS RARE NRBCs    WBC Morphology ATYPICAL LYMPHOCYTES     Current Facility-Administered Medications  Medication Dose Route Frequency Provider Last Rate Last Dose  . 0.9 %  sodium chloride infusion   Intravenous Continuous Thurnell Lose, MD 75 mL/hr at 06/18/15 0734    . albuterol (PROVENTIL) (2.5 MG/3ML) 0.083% nebulizer solution 2.5 mg  2.5 mg Nebulization Q6H PRN Nat Christen, PA-C      . antiseptic oral rinse (CPC / CETYLPYRIDINIUM CHLORIDE 0.05%) solution 7 mL  7 mL Mouth Rinse q12n4p Erroll Luna, MD      . budesonide-formoterol (SYMBICORT) 80-4.5 MCG/ACT inhaler 2 puff  2 puff Inhalation BID Nat Christen, PA-C   2 puff at 06/17/15 1446  . chlorhexidine (PERIDEX) 0.12 % solution 15 mL  15 mL Mouth Rinse BID Erroll Luna, MD      . enoxaparin (LOVENOX) injection 40 mg  40 mg Subcutaneous Daily Nat Christen, PA-C      . TPN (CLINIMIX-E) Adult   Intravenous Continuous TPN Priscella Mann, RPH       And  . fat emulsion 20 % infusion 240 mL  240 mL Intravenous Continuous TPN Priscella Mann, RPH   240 mL at 06/17/15 1904  . TPN (CLINIMIX-E) Adult   Intravenous Continuous TPN Priscella Mann, RPH       And  . fat emulsion 20 % infusion 240 mL  240 mL Intravenous Continuous TPN Priscella Mann, RPH      . hydrOXYzine (VISTARIL) injection 50 mg  50 mg Intramuscular QHS Nat Christen, PA-C   50 mg at 06/17/15 2346  . insulin aspart (novoLOG) injection 0-9 Units  0-9 Units Subcutaneous 4 times per day Priscella Mann, RPH   0 Units at 06/17/15 1901  . LORazepam (ATIVAN) injection 0.5-1 mg  0.5-1 mg Intravenous Q6H PRN Nat Christen, PA-C   1 mg at 06/18/15 6503  . meropenem (MERREM) 1 g in sodium chloride 0.9 % 100 mL IVPB  1 g Intravenous Q8H Rachel L Rumbarger, RPH 200 mL/hr at 06/18/15 0622 1 g at 06/18/15 0622  . methocarbamol (ROBAXIN)  1,000 mg in dextrose 5 % 50 mL IVPB  1,000 mg Intravenous Q8H PRN Nat Christen, PA-C      . morphine 2 MG/ML  injection 1-4 mg  1-4 mg Intravenous Q2H PRN Nat Christen, PA-C   4 mg at 06/18/15 2355  . potassium chloride 10 mEq in 50 mL *CENTRAL LINE* IVPB  10 mEq Intravenous Q1 Hr x 4 Jessica B Millen, RPH      . sodium chloride 0.9 % injection 10-40 mL  10-40 mL Intracatheter PRN Ambrose Finland, MD      . vancomycin (VANCOCIN) IVPB 1000 mg/200 mL premix  1,000 mg Intravenous Q8H Roma Schanz, RPH   1,000 mg at 06/18/15 7322  . ziprasidone (GEODON) injection 40 mg  40 mg Intramuscular BID Nat Christen, PA-C   40 mg at 06/17/15 2346    Musculoskeletal: Strength & Muscle Tone: decreased Gait & Station: unable to stand Patient leans: N/A  Psychiatric Specialty Exam: ROS patient is drowsy and sedated with medications unable to complete review of systems   Blood pressure 112/71, pulse 110, temperature 99.1 F (37.3 C), temperature source Oral, resp. rate 15, height _0  (1.727 m), weight 117.7 kg (259 lb 7.7 oz), last menstrual period 04/04/2015, SpO2 95 %.Body mass index is 39.46 kg/(m^2).  General Appearance: Guarded  Eye Contact::  Fair  Speech:  Slow  Volume:  Decreased  Mood:  Depressed  Affect:  Constricted and Depressed  Thought Process:  Coherent  Orientation:  Full (Time, Place, and Person)  Thought Content:  WDL  Suicidal Thoughts:  No  Homicidal Thoughts:  No  Memory:  Immediate;   Fair Recent;   Fair  Judgement:  Impaired  Insight:  Shallow  Psychomotor Activity:  Psychomotor Retardation  Concentration:  Poor  Recall:  [place]: Fair  Akathisia:  Negative  Handed:  Right  AIMS (if indicated):     Assets:  Communication Skills Desire for Improvement Financial Resources/Insurance Housing Intimacy Leisure Time Resilience Social Support  ADL's:  Impaired  Cognition: Impaired,  Moderate  Sleep:      Treatment Plan Summary: Daily contact with patient to assess and evaluate symptoms and progress in treatment and Medication  management  Change Geodon intramuscular 20 mg every 4 hours as needed for agitation and aggressive behavior. (Maximum 40 mg in 24 hours, needs EKG monitoring to prevent cardiac arrhythmias) Continue hydroxyzine IM 50 mg at bedtime for agitation and insomnia Appreciate psychiatric consultation and follow up as clinically required Please contact [phone] or [phone] if needs further assistance  Disposition: Patient does not meet criteria for psychiatric inpatient admission. Supportive therapy provided about ongoing stressors.  Laurena Valko,JANARDHAHA R. 06/18/2015 10:06 AM

## 2015-06-18 NOTE — Progress Notes (Signed)
Consult note                                           Patient Demographics:    Anita Villarreal, is a 44 y.o. female, DOB - 10/29/1970, UUV:[id number]  Admit date - 06/17/2015   Admitting Physician Md Montez Morita, MD  Outpatient Primary MD for the patient is No primary care provider on file.  LOS -    Chief Complaint  Patient presents with  . Abdominal Pain  . Shortness of Breath        Subjective:    Anita Villarreal today has, No headache, No chest pain, minimal generalized abdominal pain - No Nausea, No new weakness tingling or numbness, No Cough - SOB.     Assessment  & Plan :     1. Right-sided infiltrate/atelectasis with effusion. Doubt this is parapneumonic effusion, could be sympathetic effusion from the GI issue with compressive atelectasis. Discussed her case with pulmonary physician Dr. Molli Knock. For now she is on broad-spectrum antibiotics for her intra-abdominal infection, these will be continued. She denies any cough or shortness of breath. No pleuritic chest pain. Will add IS, supportive care. If she developed pleuritic chest pain or shortness of breath. Consult pulmonary formally.   2. Perforated gastric ulcer. Status post initial surgical correction on 06/04/2015. Patient aberrantly was noncompliant with dietary restrictions, now evidence of repeat perforation, defer management of this problem to general surgery. For now bowel rest, NG tube, PICC line and TNA. Continue antibiotics. She had pus drained from her belly by IR yesterday. Follow cultures.   3. Moderate PCM. On IV nutrition for now.   4. Hypokalemia. Being replaced by pharmacy per protocol.   5. Anemia of chronic disease. Outpatient follow-up by PCP no need for transfusion now. Transfuse if hemoglobin drops below 7.   6. Bipolar disorder. Her  entry on IM Geodon, nothing by mouth. Have requested psych to assist with management of her psych issues.   DVT Prophylaxis  :  Lovenox    Lab Results  Component Value Date   PLT 748* 06/18/2015    Inpatient Medications  Scheduled Meds: . antiseptic oral rinse  7 mL Mouth Rinse q12n4p  . budesonide-formoterol  2 puff Inhalation BID  . chlorhexidine  15 mL Mouth Rinse BID  . enoxaparin (LOVENOX) injection  40 mg Subcutaneous Daily  . hydrOXYzine  50 mg Intramuscular QHS  . insulin aspart  0-9 Units Subcutaneous 4 times per day  . meropenem (MERREM) IV  1 g Intravenous Q8H  . potassium chloride  10 mEq Intravenous Q1 Hr x 4  . vancomycin  1,000 mg Intravenous Q8H  . ziprasidone  40 mg Intramuscular BID   Continuous Infusions: . sodium chloride 75 mL/hr at 06/18/15 0734  . Marland KitchenTPN (CLINIMIX-E) Adult     And  . fat emulsion     PRN Meds:.albuterol, LORazepam, methocarbamol (ROBAXIN)  IV, morphine injection, sodium chloride  Antibiotics  :    Anti-infectives    Start     Dose/Rate Route Frequency Ordered Stop   06/17/15 1700  vancomycin (VANCOCIN) IVPB 1000 mg/200 mL premix     1,000 mg 200 mL/hr over 60 Minutes Intravenous Every 8 hours 06/17/15 0829     06/17/15 1700  piperacillin-tazobactam (ZOSYN) IVPB 3.375  g  Status:  Discontinued     3.375 g 12.5 mL/hr over 240 Minutes Intravenous 3 times per day 06/17/15 0832 06/17/15 1225   06/17/15 1300  meropenem (MERREM) 1 g in sodium chloride 0.9 % 100 mL IVPB     1 g 200 mL/hr over 30 Minutes Intravenous Every 8 hours 06/17/15 1227     06/17/15 0845  vancomycin (VANCOCIN) 2,000 mg in sodium chloride 0.9 % 500 mL IVPB     2,000 mg 250 mL/hr over 120 Minutes Intravenous  Once 06/17/15 0829 06/17/15 1205   06/17/15 0845  piperacillin-tazobactam (ZOSYN) IVPB 3.375 g     3.375 g 100 mL/hr over 30 Minutes Intravenous  Once 06/17/15 0832 06/17/15 0947   06/17/15 0830  piperacillin-tazobactam (ZOSYN) IVPB 3.375 g  Status:   Discontinued     3.375 g 12.5 mL/hr over 240 Minutes Intravenous 3 times per day 06/17/15 0829 06/17/15 0832        Objective:   Filed Vitals:   06/17/15 1730 06/17/15 2053 06/18/15 0217 06/18/15 0639  BP: 137/74 115/79 125/68 112/71  Pulse: 104 117 105 110  Temp: 98.5 F (36.9 C) 98.6 F (37 C) 99.2 F (37.3 C) 99.1 F (37.3 C)  TempSrc: Oral Oral Oral Oral  Resp: 16 15 16 15   Height: 5\' 8"  (1.727 m)     Weight: 117.7 kg (259 lb 7.7 oz)     SpO2: 94% 97% 97% 95%    Wt Readings from Last 3 Encounters:  06/17/15 117.7 kg (259 lb 7.7 oz)  06/04/15 111.131 kg (245 lb)     Intake/Output Summary (Last 24 hours) at 06/18/15 1016 Last data filed at 06/18/15 0745  Gross per 24 hour  Intake  23.75 ml  Output   4760 ml  Net -4736.25 ml     Physical Exam  Awake Alert, Oriented X 3, No new F.N deficits, Normal affect Joice.AT,PERRAL Supple Neck,No JVD, No cervical lymphadenopathy appriciated.  Symmetrical Chest wall movement, Good air movement bilaterally, minimal right basilar rales RRR,No Gallops,Rubs or new Murmurs, No Parasternal Heave +ve B.Sounds, Abd Soft, mild generalized tenderness, NG tube in place, abdominal dressing in place, No organomegaly appriciated, No rebound - guarding or rigidity. No Cyanosis, Clubbing or edema, No new Rash or bruise     Data Review:   Micro Results No results found for this or any previous visit (from the past 240 hour(s)).  Radiology Reports Dg Chest 2 View  06/18/2015  CLINICAL DATA:  44 year old female with history of cough and fever since yesterday evening. EXAM: CHEST  2 VIEW COMPARISON:  06/17/2015. FINDINGS: A nasogastric tube is seen extending into the stomach, however, the tip of the nasogastric tube extends below the lower margin of the image. Persistent airspace consolidation in the right lower lobe. Moderate right and small left pleural effusions. No evidence of pulmonary edema. Mild cardiomegaly. Upper mediastinal contours  are within normal limits. IMPRESSION: 1. Nasogastric tube extending into the stomach. 2. Persistent right lower lobe airspace consolidation concerning for pneumonia with moderate right parapneumonic pleural effusion. Small left pleural effusion. Electronically Signed   By: Trudie Reed M.D.   On: 06/18/2015 07:37   Dg Chest 2 View  06/13/2015  CLINICAL DATA:  Elevated white blood count, fever today, surgery 2 days ago EXAM: CHEST  2 VIEW COMPARISON:  05/21/2012 FINDINGS: Heart size normal. Limited inspiratory effect. Bilateral lower lobe infiltrates. Small right effusion. IMPRESSION: Bilateral lower lobe infiltrates concerning for pneumonia or pneumonitis. Electronically Signed  By: Esperanza Heir M.D.   On: 06/13/2015 11:00   Ct Abdomen Pelvis W Contrast  06/17/2015  CLINICAL DATA:  Diffuse abdominal pain. Prior abdominal surgery 06/04/2015. EXAM: CT ABDOMEN AND PELVIS WITH CONTRAST TECHNIQUE: Multidetector CT imaging of the abdomen and pelvis was performed using the standard protocol following bolus administration of intravenous contrast. CONTRAST:  OMNIPAQUE IOHEXOL 300 MG/ML  SOLN COMPARISON:  06/12/2015 FINDINGS: Lower chest: Small right pleural effusion. Right lower lobe airspace disease likely reflecting compressive atelectasis. Normal heart size. Hepatobiliary: Normal liver. Prior cholecystectomy. No intrahepatic or extrahepatic biliary ductal dilatation. Pancreas: Normal. Spleen: Normal. Adrenals/Urinary Tract: Normal adrenal glands. Normal kidneys. No urolithiasis or obstructive uropathy. Partially decompressed, normal bladder. Stomach/Bowel: There is a defect in the wall of the lesser curvature of the stomach with bubbles of air traversing outside of the stomach most consistent with a perforated gastric ulcer. Multiple fluid-filled loops of small bowel with mild small bowel dilatation. No colonic dilatation. Prior appendectomy. No pneumatosis, pneumoperitoneum or portal venous gas.  Interval removal of surgical drains. Small amount of perihepatic free fluid. No pneumoperitoneum, pneumatosis or portal venous gas. Postsurgical changes in the anterior abdominal wall from midline laparotomy. Vascular/Lymphatic: Normal caliber abdominal aorta. No abdominal or pelvic lymphadenopathy. Reproductive: Normal uterus. Bilateral tubal ligation. Cystic structures in the adnexa bilaterally which may reflect hydrosalpinx. Other: Intraperitoneal 3.2 x 9.5 x 7.3 cm fluid collection along along the left anterior abdominal wall most concerning for abscess or postoperative seroma. There is severe nodularity and small areas of fluid collection within the left side of the omentum. There is a 3.9 x 7.5 cm fluid collection along the fundus of the stomach in which may reflect a postoperative seroma versus an abscess. There is a second fluid collection more inferiorly measuring 4.7 x 5.3 cm. Mild body wall edema. Musculoskeletal: No aggressive lytic or sclerotic osseous lesion. No acute osseous abnormality. IMPRESSION: 1. Defect in the wall of the lesser curvature of the stomach with bubbles of air traversing outside of the stomach most consistent with a perforated gastric ulcer. 2. Intraperitoneal 3.2 x 9.5 x 7.3 cm fluid collection along along the left anterior abdominal wall most concerning for abscess or postoperative seroma. Severe nodularity and small areas of fluid collection within the left omentum concerning for multiple small abscesses. 3. Small right pleural effusion with airspace disease likely reflecting compressive atelectasis. Critical Value/emergent results were called by telephone at the time of interpretation on 06/17/2015 at 10:10 am to Dr. Jaci Carrel , who verbally acknowledged these results. Electronically Signed   By: Elige Ko   On: 06/17/2015 10:13   Ct Abdomen Pelvis W Contrast  06/12/2015  CLINICAL DATA:  Leukocytosis, status post laparotomy post gastric perforation EXAM: CT  ABDOMEN AND PELVIS WITH CONTRAST TECHNIQUE: Multidetector CT imaging of the abdomen and pelvis was performed using the standard protocol following bolus administration of intravenous contrast. CONTRAST:  OMNIPAQUE IOHEXOL 300 MG/ML  SOLN COMPARISON:  06/04/2015 FINDINGS: There is small right pleural effusion with right lower lobe atelectasis or infiltrate. Sagittal images of the spine are unremarkable. No there is a drain in right abdomen with tip in midline upper abdomen anterior gastric region. Please see axial image 20. Small perihepatic ascites. Enhanced liver, pancreas, spleen and adrenal glands are unremarkable. Enhanced kidneys are symmetrical in size. No hydronephrosis or hydroureter. Delayed renal images shows bilateral renal symmetrical excretion. Bilateral visualized proximal ureter is unremarkable. Oral contrast material was given to the patient. Small pockets of free fluid  noted within abdomen. A fluid collection just posterior to the stomach measures 6.1 by 2.5 cm. There is a elongated fluid collection in left anterior abdomen axial image 48 measures 8.6 by 2.6 cm. Air-fluid collection in right lower abdomen axial image 60 measures about 5.8 cm. Small pocket of residual free is noted in right lower abdomen axial image 50. This may be a from prior perforation or postsurgical in nature. No definite mesenteric enhancement to suggest a definite abscess. Moderate free fluid noted in right paracolic gutter surrounding small bowel loops. Moderate free fluid noted within pelvis. There is no evidence of oral contrast material extravasation. Mild anasarca infiltration of subcutaneous fat bilateral flank and pelvic wall. The urinary bladder is under distended. There is no inguinal adenopathy. Oral contrast material noted within descending colon and rectum. There is no evidence of colonic obstruction. Mild distended small bowel loops in right lower abdomen with some air-fluid level and probable mild ileus.  Unremarkable uterus and ovaries. Post tubal ligation surgical clips are noted. IMPRESSION: 1. There is small right pleural effusion. Right lower lobe posterior atelectasis or infiltrate. Trace atelectasis left base posteriorly. 2. Small perihepatic ascites. There is a probable postsurgical drain in right abdomen with tip in the upper abdomen anterior to the stomach. There are pockets of free fluid throughout the abdomen. The largest pocket posterior to the stomach measures 6.1 x 2.5 cm. A pocket of fluid in left abdomen just anterior to left kidney measures 4.6 cm. Elongated pocket of fluid in left abdomen anteriorly measures 8.6 x 2.6 cm. Small amount of residual free air is noted left abdomen anteriorly please see axial image 50. Moderate free fluid noted right paracolic gutter. Small free fluid noted in left paracolic gutter. Moderate free fluid noted within pelvis. No definite enhancement to suggest abscesses but infection cannot be excluded. Clinical correlation is necessary 3. There is no evidence of oral contrast material extravasation. No colonic obstruction. Contrast material noted within rectum. 4. Mild distended small bowel loops in right lower quadrant with some air-fluid level probable mild ileus. No evidence of small bowel obstruction. 5. Unremarkable uterus and ovaries. Post tubal ligation surgical clips are noted. Electronically Signed   By: Natasha Mead M.D.   On: 06/12/2015 15:37   Ct Abdomen Pelvis W Contrast  06/04/2015  CLINICAL DATA:  Acute onset of severe generalized abdominal pain. Initial encounter. EXAM: CT ABDOMEN AND PELVIS WITH CONTRAST TECHNIQUE: Multidetector CT imaging of the abdomen and pelvis was performed using the standard protocol following bolus administration of intravenous contrast. CONTRAST:  OMNIPAQUE IOHEXOL 300 MG/ML  SOLN COMPARISON:  None. FINDINGS: Minimal right basilar atelectasis is noted. Moderate volume ascites noted within the abdomen and pelvis. The  ascites contains layering contrast, and scattered free air is also seen. Contrast extends out of the stomach at the anterior lesser curvature of the stomach, compatible with gastric perforation. The liver and spleen are unremarkable in appearance. The patient is status post cholecystectomy, with clips noted laterally. The pancreas and adrenal glands are unremarkable. The kidneys are unremarkable in appearance. There is no evidence of hydronephrosis. No renal or ureteral stones are seen. No perinephric stranding is appreciated. The small bowel is unremarkable in appearance. The stomach is within normal limits. No acute vascular abnormalities are seen. The patient is status post appendectomy. The colon is largely decompressed and grossly unremarkable in appearance. The bladder is mildly distended and grossly unremarkable. The uterus is unremarkable in appearance. The ovaries are relatively symmetric. No suspicious  adnexal masses are seen. Bilateral tubal ligation clips are noted. No inguinal lymphadenopathy is seen. No acute osseous abnormalities are identified. IMPRESSION: Acute gastric perforation noted, with contrast extending out of the stomach at the anterior lesser curvature of the stomach. Associated moderate volume ascites noted in the abdomen and pelvis, containing layering oral contrast and scattered free air. Critical Value/emergent results were called by telephone at the time of interpretation on 06/04/2015 at 3:02 am to Dr. Dione Booze, who verbally acknowledged these results. Electronically Signed   By: Roanna Raider M.D.   On: 06/04/2015 03:12   Dg Abd Acute W/chest  06/17/2015  CLINICAL DATA:  Acute onset of generalized abdominal pain, nausea and vomiting. Initial encounter. EXAM: DG ABDOMEN ACUTE W/ 1V CHEST COMPARISON:  Chest radiograph performed 06/13/2015 FINDINGS: The lungs are well-aerated. A small to moderate right-sided pleural effusion is noted, with right basilar airspace opacification.  This is mildly worsened from the prior study, concerning for pneumonia or asymmetric pulmonary edema. Mild left basilar atelectasis is noted. Vascular congestion is noted. There is no evidence of pneumothorax. The cardiomediastinal silhouette is borderline enlarged. The visualized bowel gas pattern is unremarkable. Scattered stool and air are seen within the colon; there is no evidence of small bowel dilatation to suggest obstruction. No free intra-abdominal air is identified on the provided upright view. Clips are noted within the right upper quadrant, reflecting prior cholecystectomy. No acute osseous abnormalities are seen; the sacroiliac joints are unremarkable in appearance. Bilateral tubal ligation clips are noted. IMPRESSION: 1. Small to moderate right-sided pleural effusion, with right basilar airspace opacification. This is mildly worsened from the prior study, concerning for pneumonia or asymmetric pulmonary edema. Mild left basilar atelectasis noted. 2. Vascular congestion and borderline cardiomegaly noted. 3. Unremarkable bowel gas pattern; no free intra-abdominal air seen. Electronically Signed   By: Roanna Raider M.D.   On: 06/17/2015 06:10   Dg Abd Portable 1v  06/11/2015  CLINICAL DATA:  Post abdominal surgery.  Distended abdomen. EXAM: PORTABLE ABDOMEN - 1 VIEW COMPARISON:  None. FINDINGS: Contrast noted within the colon. Nonobstructive bowel gas pattern. Surgical clips in the right upper quadrant presumably from prior cholecystectomy. Bilateral tubal ligation clips in the pelvis. No visible free air. IMPRESSION: No evidence of bowel obstruction or free air. Electronically Signed   By: Charlett Nose M.D.   On: 06/11/2015 15:26   Dg Kayleen Memos W/water Sol Cm  06/08/2015  CLINICAL DATA:  Postop lesser curve gastric ulcer perforation status post patch. Check for gastric leak. EXAM: UPPER GI SERIES WITH KUB TECHNIQUE: After obtaining a scout radiograph a routine upper GI series was performed using thin  density water-soluble contrast. Approximately 300 cc of Omnipaque 300 was administered orally. FLUOROSCOPY TIME:  If the device does not provide the exposure index: Fluoroscopy Time (in minutes and seconds):  2 minutes and 2 seconds. COMPARISON:  CT abdomen and pelvis dated 06/04/2015. FINDINGS: Scout radiograph shows a grossly nonobstructive bowel gas pattern. Surgical clips within the right abdomen. Tubal ligation clips within the pelvis. Oral contrast was then administered and serial fluoroscopic spot images obtained of the stomach. There was adequate filling of the stomach with contrast and contrast subsequently passed into the proximal duodenum. Patient was placed in the right lateral decubitus position in order to move the contrast to the lesser curvature and gastric antrum region. There was no extraluminal contrast identified during the exam. IMPRESSION: No evidence of gastric leak.  No extraluminal contrast seen. Electronically Signed   By: Weyman Croon  Linde Gillis M.D.   On: 06/08/2015 14:23   Ct Image Guided Drainage By Percutaneous Catheter  06/17/2015  CLINICAL DATA:  Gastric perforation, with postop abdominal fluid collections. Largest peripherally enhancing collection in the left lower quadrant anteriorly adjacent to the body wall. EXAM: CT GUIDED PERITONEAL ABSCESS DRAIN CATHETER PLACEMENT ANESTHESIA/SEDATION: Intravenous Fentanyl and Versed were administered as conscious sedation during continuous cardiorespiratory monitoring by the radiology RN, with a total moderate sedation time of 10 minutes. PROCEDURE: The procedure risks, benefits, and alternatives were explained to the patient. Questions regarding the procedure were encouraged and answered. The patient understands and consents to the procedure. select axial scans obtained through the mid abdomen. The dominant, peripherally enhancing left lower quadrant collection was localized and an appropriate skin entry site was determined and marked. The  operative field was prepped with chlorhexidinein a sterile fashion, and a sterile drape was applied covering the operative field. A sterile gown and sterile gloves were used for the procedure. Local anesthesia was provided with 1% Lidocaine. Under CT fluoroscopic guidance, a 19 gauge percutaneous entry needle advanced into the collection. Purulent material could be aspirated. A guidewire advanced easily within the collection, position confirmed on CT. Tract dilated to facilitate placement of a 12 French pigtail catheter, formed centrally within the collection. 20 mL of purulent aspirate were removed and sent for Gram stain, culture and sensitivity. The catheter was secured externally with 0 Prolene suture and StatLock and placed to gravity drain bag. The patient tolerated the procedure well. COMPLICATIONS: None immediate FINDINGS: CT confirms the 9.4 cm intraperitoneal left lower quadrant collection adjacent to the anterior body wall. 12French pigtail drain catheter placed as above. Sample sent for Gram stain, culture and sensitivity. IMPRESSION: Technically successful CT-guided peritoneal abscess drain catheter placement. Electronically Signed   By: Corlis Leak M.D.   On: 06/17/2015 16:58     CBC  Recent Labs Lab 06/13/15 0445 06/14/15 0608 06/15/15 0358 06/17/15 0428 06/18/15 0455  WBC 19.1* 16.6* 17.4* 15.0* 15.6*  HGB 9.0* 8.7* 8.6* 7.7* 8.2*  HCT 26.2* 24.7* 24.6* 22.7* 23.7*  PLT 486* 516* 534* 597* 748*  MCV 84.2 84.3 84.5 83.8 85.3  MCH 28.9 29.7 29.6 28.4 29.5  MCHC 34.4 35.2 35.0 33.9 34.6  RDW 17.1* 17.2* 17.3* 17.0* 17.4*  LYMPHSABS  --   --   --   --  3.7  MONOABS  --   --   --   --  1.7*  EOSABS  --   --   --   --  0.2  BASOSABS  --   --   --   --  0.0    Chemistries   Recent Labs Lab 06/12/15 0551 06/13/15 0445 06/14/15 0608 06/17/15 0428 06/17/15 1215 06/18/15 0455  NA 142 141 140 138  --  142  K 3.2* 3.2* 3.3* 2.9*  --  3.3*  CL 112* 109 108 106  --  106  CO2 23  23 23 24   --  24  GLUCOSE 115* 92 109* 106*  --  90  BUN <5* <5* <5* <5*  --  <5*  CREATININE 0.80 0.77 0.69 0.73  --  0.82  CALCIUM 7.8* 8.0* 7.7* 7.9*  --  8.0*  MG  --   --   --   --  1.7 2.1  AST  --   --   --  21  --  24  ALT  --   --   --  13*  --  14  ALKPHOS  --   --   --  142*  --  138*  BILITOT  --   --   --  0.5  --  0.6   ------------------------------------------------------------------------------------------------------------------ estimated creatinine clearance is 115.6 mL/min (by C-G formula based on Cr of 0.82). ------------------------------------------------------------------------------------------------------------------ No results for input(s): HGBA1C in the last 72 hours. ------------------------------------------------------------------------------------------------------------------  Recent Labs  06/18/15 0450  TRIG 99   ------------------------------------------------------------------------------------------------------------------ No results for input(s): TSH, T4TOTAL, T3FREE, THYROIDAB in the last 72 hours.  Invalid input(s): FREET3 ------------------------------------------------------------------------------------------------------------------ No results for input(s): VITAMINB12, FOLATE, FERRITIN, TIBC, IRON, RETICCTPCT in the last 72 hours.  Coagulation profile  Recent Labs Lab 06/17/15 1148  INR 1.50*    No results for input(s): DDIMER in the last 72 hours.  Cardiac Enzymes No results for input(s): CKMB, TROPONINI, MYOGLOBIN in the last 168 hours.  Invalid input(s): CK ------------------------------------------------------------------------------------------------------------------ Invalid input(s): POCBNP   Time Spent in minutes   35   SINGH,PRASHANT K M.D on 06/18/2015 at 10:16 AM  Between 7am to 7pm - Pager - [phone]  After 7pm go to [email or web address] - password [email or web address]  [facility] -  Office  [phone]

## 2015-06-18 NOTE — Progress Notes (Signed)
PARENTERAL NUTRITION CONSULT NOTE -Follow-up  Pharmacy Consult for TPN Indication: Gastric perforation  Allergies  Allergen Reactions  . Erythromycin Other (See Comments)    Tongue swelling   . Sulfa Antibiotics     swelling    Patient Measurements: Height: 5' 8" (172.7 cm) Weight: 259 lb 7.7 oz (117.7 kg) IBW/kg (Calculated) : 63.9 Adjusted Body Weight: 77 kg BMI: 39  Vital Signs: Temp: 99.1 F (37.3 C) (12/13 0639) Temp Source: Oral (12/13 0639) BP: 112/71 mmHg (12/13 0639) Pulse Rate: 110 (12/13 0639)  Labs:  Recent Labs  06/17/15 0428 06/17/15 1148 06/18/15 0455  WBC 15.0*  --  15.6*  HGB 7.7*  --  8.2*  HCT 22.7*  --  23.7*  PLT 597*  --  748*  APTT  --  41*  --   INR  --  1.50*  --      Recent Labs  06/17/15 0428 06/17/15 1215 06/18/15 0450 06/18/15 0455  NA 138  --   --  142  K 2.9*  --   --  3.3*  CL 106  --   --  106  CO2 24  --   --  24  GLUCOSE 106*  --   --  90  BUN <5*  --   --  <5*  CREATININE 0.73  --   --  0.82  CALCIUM 7.9*  --   --  8.0*  MG  --  1.7  --  2.1  PHOS  --  3.7  --  3.5  PROT 5.8*  --   --  6.3*  ALBUMIN 1.7*  --   --  1.8*  AST 21  --   --  24  ALT 13*  --   --  14  ALKPHOS 142*  --   --  138*  BILITOT 0.5  --   --  0.6  PREALBUMIN  --   --   --  7.9*  TRIG  --   --  99  --    Estimated Creatinine Clearance: 115.6 mL/min (by C-G formula based on Cr of 0.82).   No results for input(s): GLUCAP in the last 72 hours.  Insulin Requirements in the past 24 hours:  None at this time  Assessment: 44 year old female with recent history gastric perforation s/p exlap and patch repair of gastric ulcer perforation on 11/29. Patient was discharged on 12/10 with soft oral diet. Now re-admitted 12/12 with progressive worsening of abdominal pain, chills, nausea, anorexia, and loose stools and CT shows defect of the lesser curve of the stomach with air bubbles concerning for perforated gastric ulcer, abscess or post-operative  seroma.   *TPN not administered overnight due to delay in PICC line placement.   GI: GERD, gastric ulcers, perf -IR consult for possible perc drain placement. (diet) Albumin 1.8. Prealbumin low at 7.9. Poor po intake 2/2 abd pain, but good BM pta.  Endo: BG wnl Lytes: K low, but improved (total 9mq 12/12),  CoCa 9.8 (CaxPhos = 34) - at risk for refeeding. Renal: SCr 0.82, IVF- NS @ 75 (K removed) Pulm: R-pleural effusion with possible thoracentesis if not improving over next few days.  Cards: BP wnl, ST up to 117.  Hepatobil: Alk phos trending back down, others wnl-low. Tbili wnl. TG 99. Neuro: Bipolar disorder.  ID:  HCAP +/- intraabdominal infection on vancomycin and merrem. WBC up, Tm 99.1, LA wnl. LLQ abscess drained 12/12 in IR- 20 mL purulent aspirate.  Best Practices: SCDs TPN Access: PICC placement 12/13 TPN start date: 12/13 >> present  Current Nutrition:  NPO  Nutritional Goals:  1900-2000 kCal, 95-115 grams of protein per day   Plan: Start after access established. Initiate Clinimix E 5/15 at 62m/hr (approximate goal 83 mL/hr) and IVFE at 19mhr. Daily multivitamin and trace elements Continue sensitive SSI q6h Supplement K+ with 40 mEq IV today Follow-up TPN labs in AM   JeSloan LeiterPharmD, BCPS Clinical Pharmacist [phone]/13/2016,7:19 AM

## 2015-06-18 NOTE — Progress Notes (Signed)
*  PRELIMINARY RESULTS* Echocardiogram 2D Echocardiogram has been performed.  Jeryl Columbialliott, Jerene Yeager 06/18/2015, 3:34 PM

## 2015-06-18 NOTE — Consult Note (Signed)
WOC wound consult note Reason for Consult: Pt is familiar to WOC from previous admission. Consult requested to apply Vac dressing to abd wound.  Pt has been using Vac machine at home prior to admission and her machine, case, and charger are all left in the room for use upon discharge. Wound type: Full thickness post-op wound Measurement:20X4X1cm Wound bed: beefy red Drainage (amount, consistency, odor) scant amt yellow drainage, no odor Periwound: Intact skin surrounding Dressing procedure/placement/frequency: Applied one piece of black foam to 125mm cont suction. Pt medicated for pain prior to procedure and tolerated with mod amt discomfort.  CCS at bedside to assess wound appearance.  Plan for bedside nurses to change Fri, then Q M/W/F the following week. Please re-consult if further assistance is needed.  Thank-you,  Cammie Mcgeeawn Dorita Rowlands MSN, RN, CWOCN, [place]AP, CNS [phone]

## 2015-06-18 NOTE — Progress Notes (Signed)
Peripherally Inserted Central Catheter/Midline Placement  The IV Nurse has discussed with the patient and/or persons authorized to consent for the patient, the purpose of this procedure and the potential benefits and risks involved with this procedure.  The benefits include less needle sticks, lab draws from the catheter and patient may be discharged home with the catheter.  Risks include, but not limited to, infection, bleeding, blood clot (thrombus formation), and puncture of an artery; nerve damage and irregular heat beat.  Alternatives to this procedure were also discussed.  PICC/Midline Placement Documentation        Anita Villarreal, Anita Villarreal 06/18/2015, 10:12 AM

## 2015-06-18 NOTE — Progress Notes (Signed)
[facility] Progress Note     Subjective: Pt emotional, IV team took a while to get PICC line.  She notes pain in her abdomen, no more N/V since NG in place.  Ambulating some OOB.  Using IS.  Last BM 06/14/15.  Urinating well.  Drain output recorded in 60mL purulent drainage since placement, but has 20mL of tan purulent drainage in leg bag currently.    Objective: Vital signs in last 24 hours: Temp:  [98.5 F (36.9 C)-99.2 F (37.3 C)] 99.1 F (37.3 C) (12/13 0639) Pulse Rate:  [94-117] 110 (12/13 0639) Resp:  [15-32] 15 (12/13 0639) BP: (103-137)/(57-79) 112/71 mmHg (12/13 0639) SpO2:  [94 %-100 %] 95 % (12/13 0639) Weight:  [117.7 kg (259 lb 7.7 oz)] 117.7 kg (259 lb 7.7 oz) (12/12 1730) Last BM Date: 06/14/15  Intake/Output from previous day: 12/12 0701 - 12/13 0700 In: 5  Out: 4570 [Urine:3350; Emesis/NG output:800; Drains:20] Intake/Output this shift: Total I/O In: 18.8 [I.V.:13.8; Other:5] Out: 190 [Emesis/NG output:150; Drains:40]  PE: Gen:  Alert, NAD, pleasant, but emotional Card:  RRR, no M/G/R heard Pulm:  Poor effort, no wheezing/rhonchi/ralls, diminished BS in lower right Abd: Soft, distended, tender throughout, +BS, no HSM, midline wound is clean with 100% granulation tissue.  Minimal sanguinous drainage, no purulence.  Nearly ready for d/c of wound vac Ext:  Edema of b/l LE   06/18/15 VAC change   Lab Results:   Recent Labs  06/17/15 0428 06/18/15 0455  WBC 15.0* 15.6*  HGB 7.7* 8.2*  HCT 22.7* 23.7*  PLT 597* 748*   BMET  Recent Labs  06/17/15 0428 06/18/15 0455  NA 138 142  K 2.9* 3.3*  CL 106 106  CO2 24 24  GLUCOSE 106* 90  BUN <5* <5*  CREATININE 0.73 0.82  CALCIUM 7.9* 8.0*   PT/INR  Recent Labs  06/17/15 1148  LABPROT 18.2*  INR 1.50*   CMP     Component Value Date/Time   NA 142 06/18/2015 0455   K 3.3* 06/18/2015 0455   CL 106 06/18/2015 0455   CO2 24 06/18/2015 0455   GLUCOSE 90 06/18/2015 0455   BUN  <5* 06/18/2015 0455   CREATININE 0.82 06/18/2015 0455   CALCIUM 8.0* 06/18/2015 0455   PROT 6.3* 06/18/2015 0455   ALBUMIN 1.8* 06/18/2015 0455   AST 24 06/18/2015 0455   ALT 14 06/18/2015 0455   ALKPHOS 138* 06/18/2015 0455   BILITOT 0.6 06/18/2015 0455   GFRNONAA >60 06/18/2015 0455   GFRAA >60 06/18/2015 0455   Lipase     Component Value Date/Time   LIPASE 36 06/17/2015 0428       Studies/Results: Dg Chest 2 View  06/18/2015  CLINICAL DATA:  44 year old female with history of cough and fever since yesterday evening. EXAM: CHEST  2 VIEW COMPARISON:  06/17/2015. FINDINGS: A nasogastric tube is seen extending into the stomach, however, the tip of the nasogastric tube extends below the lower margin of the image. Persistent airspace consolidation in the right lower lobe. Moderate right and small left pleural effusions. No evidence of pulmonary edema. Mild cardiomegaly. Upper mediastinal contours are within normal limits. IMPRESSION: 1. Nasogastric tube extending into the stomach. 2. Persistent right lower lobe airspace consolidation concerning for pneumonia with moderate right parapneumonic pleural effusion. Small left pleural effusion. Electronically Signed   By: Trudie Reedaniel  Entrikin M.D.   On: 06/18/2015 07:37   Ct Abdomen Pelvis W Contrast  06/17/2015  CLINICAL DATA:  Diffuse abdominal  pain. Prior abdominal surgery 06/04/2015. EXAM: CT ABDOMEN AND PELVIS WITH CONTRAST TECHNIQUE: Multidetector CT imaging of the abdomen and pelvis was performed using the standard protocol following bolus administration of intravenous contrast. CONTRAST:  OMNIPAQUE IOHEXOL 300 MG/ML  SOLN COMPARISON:  06/12/2015 FINDINGS: Lower chest: Small right pleural effusion. Right lower lobe airspace disease likely reflecting compressive atelectasis. Normal heart size. Hepatobiliary: Normal liver. Prior cholecystectomy. No intrahepatic or extrahepatic biliary ductal dilatation. Pancreas: Normal. Spleen: Normal.  Adrenals/Urinary Tract: Normal adrenal glands. Normal kidneys. No urolithiasis or obstructive uropathy. Partially decompressed, normal bladder. Stomach/Bowel: There is a defect in the wall of the lesser curvature of the stomach with bubbles of air traversing outside of the stomach most consistent with a perforated gastric ulcer. Multiple fluid-filled loops of small bowel with mild small bowel dilatation. No colonic dilatation. Prior appendectomy. No pneumatosis, pneumoperitoneum or portal venous gas. Interval removal of surgical drains. Small amount of perihepatic free fluid. No pneumoperitoneum, pneumatosis or portal venous gas. Postsurgical changes in the anterior abdominal wall from midline laparotomy. Vascular/Lymphatic: Normal caliber abdominal aorta. No abdominal or pelvic lymphadenopathy. Reproductive: Normal uterus. Bilateral tubal ligation. Cystic structures in the adnexa bilaterally which may reflect hydrosalpinx. Other: Intraperitoneal 3.2 x 9.5 x 7.3 cm fluid collection along along the left anterior abdominal wall most concerning for abscess or postoperative seroma. There is severe nodularity and small areas of fluid collection within the left side of the omentum. There is a 3.9 x 7.5 cm fluid collection along the fundus of the stomach in which may reflect a postoperative seroma versus an abscess. There is a second fluid collection more inferiorly measuring 4.7 x 5.3 cm. Mild body wall edema. Musculoskeletal: No aggressive lytic or sclerotic osseous lesion. No acute osseous abnormality. IMPRESSION: 1. Defect in the wall of the lesser curvature of the stomach with bubbles of air traversing outside of the stomach most consistent with a perforated gastric ulcer. 2. Intraperitoneal 3.2 x 9.5 x 7.3 cm fluid collection along along the left anterior abdominal wall most concerning for abscess or postoperative seroma. Severe nodularity and small areas of fluid collection within the left omentum concerning for  multiple small abscesses. 3. Small right pleural effusion with airspace disease likely reflecting compressive atelectasis. Critical Value/emergent results were called by telephone at the time of interpretation on 06/17/2015 at 10:10 am to Dr. Jaci Carrel , who verbally acknowledged these results. Electronically Signed   By: Elige Ko   On: 06/17/2015 10:13   Dg Abd Acute W/chest  06/17/2015  CLINICAL DATA:  Acute onset of generalized abdominal pain, nausea and vomiting. Initial encounter. EXAM: DG ABDOMEN ACUTE W/ 1V CHEST COMPARISON:  Chest radiograph performed 06/13/2015 FINDINGS: The lungs are well-aerated. A small to moderate right-sided pleural effusion is noted, with right basilar airspace opacification. This is mildly worsened from the prior study, concerning for pneumonia or asymmetric pulmonary edema. Mild left basilar atelectasis is noted. Vascular congestion is noted. There is no evidence of pneumothorax. The cardiomediastinal silhouette is borderline enlarged. The visualized bowel gas pattern is unremarkable. Scattered stool and air are seen within the colon; there is no evidence of small bowel dilatation to suggest obstruction. No free intra-abdominal air is identified on the provided upright view. Clips are noted within the right upper quadrant, reflecting prior cholecystectomy. No acute osseous abnormalities are seen; the sacroiliac joints are unremarkable in appearance. Bilateral tubal ligation clips are noted. IMPRESSION: 1. Small to moderate right-sided pleural effusion, with right basilar airspace opacification. This is mildly worsened  from the prior study, concerning for pneumonia or asymmetric pulmonary edema. Mild left basilar atelectasis noted. 2. Vascular congestion and borderline cardiomegaly noted. 3. Unremarkable bowel gas pattern; no free intra-abdominal air seen. Electronically Signed   By: Roanna Raider M.D.   On: 06/17/2015 06:10   Ct Image Guided Drainage By  Percutaneous Catheter  06/17/2015  CLINICAL DATA:  Gastric perforation, with postop abdominal fluid collections. Largest peripherally enhancing collection in the left lower quadrant anteriorly adjacent to the body wall. EXAM: CT GUIDED PERITONEAL ABSCESS DRAIN CATHETER PLACEMENT ANESTHESIA/SEDATION: Intravenous Fentanyl and Versed were administered as conscious sedation during continuous cardiorespiratory monitoring by the radiology RN, with a total moderate sedation time of 10 minutes. PROCEDURE: The procedure risks, benefits, and alternatives were explained to the patient. Questions regarding the procedure were encouraged and answered. The patient understands and consents to the procedure. select axial scans obtained through the mid abdomen. The dominant, peripherally enhancing left lower quadrant collection was localized and an appropriate skin entry site was determined and marked. The operative field was prepped with chlorhexidinein a sterile fashion, and a sterile drape was applied covering the operative field. A sterile gown and sterile gloves were used for the procedure. Local anesthesia was provided with 1% Lidocaine. Under CT fluoroscopic guidance, a 19 gauge percutaneous entry needle advanced into the collection. Purulent material could be aspirated. A guidewire advanced easily within the collection, position confirmed on CT. Tract dilated to facilitate placement of a 12 French pigtail catheter, formed centrally within the collection. 20 mL of purulent aspirate were removed and sent for Gram stain, culture and sensitivity. The catheter was secured externally with 0 Prolene suture and StatLock and placed to gravity drain bag. The patient tolerated the procedure well. COMPLICATIONS: None immediate FINDINGS: CT confirms the 9.4 cm intraperitoneal left lower quadrant collection adjacent to the anterior body wall. 12French pigtail drain catheter placed as above. Sample sent for Gram stain, culture and  sensitivity. IMPRESSION: Technically successful CT-guided peritoneal abscess drain catheter placement. Electronically Signed   By: Corlis Leak M.D.   On: 06/17/2015 16:58    Anti-infectives: Anti-infectives    Start     Dose/Rate Route Frequency Ordered Stop   06/17/15 1700  vancomycin (VANCOCIN) IVPB 1000 mg/200 mL premix     1,000 mg 200 mL/hr over 60 Minutes Intravenous Every 8 hours 06/17/15 0829     06/17/15 1700  piperacillin-tazobactam (ZOSYN) IVPB 3.375 g  Status:  Discontinued     3.375 g 12.5 mL/hr over 240 Minutes Intravenous 3 times per day 06/17/15 0832 06/17/15 1225   06/17/15 1300  meropenem (MERREM) 1 g in sodium chloride 0.9 % 100 mL IVPB     1 g 200 mL/hr over 30 Minutes Intravenous Every 8 hours 06/17/15 1227     06/17/15 0845  vancomycin (VANCOCIN) 2,000 mg in sodium chloride 0.9 % 500 mL IVPB     2,000 mg 250 mL/hr over 120 Minutes Intravenous  Once 06/17/15 0829 06/17/15 1205   06/17/15 0845  piperacillin-tazobactam (ZOSYN) IVPB 3.375 g     3.375 g 100 mL/hr over 30 Minutes Intravenous  Once 06/17/15 0832 06/17/15 0947   06/17/15 0830  piperacillin-tazobactam (ZOSYN) IVPB 3.375 g  Status:  Discontinued     3.375 g 12.5 mL/hr over 240 Minutes Intravenous 3 times per day 06/17/15 0829 06/17/15 1610       Assessment/Plan Perforated lesser curve gastric ulcer with purulent contamination of the abdominal cavity POD #14 s/p Ex Lap with omental patch  repair, drain placement Intraabdominal fluid collections concerning for abscess -Dr. Deanne Coffer placed IR drain 06/17/15, 60mL drained since placement, but bag has at least 20mL of purulent drainage currently -No plans for urgent surgery given she is now 2 weeks out from surgery, she would hard to get back into her abdomen due to adhesions. Hopefully with antibiotics, bowel rest, IR drainage, and time this will heal. -NPO, NG tube, bowel rest, IVF, pain control, antiemetics (Vanc/zosyn - for intraabdominal infection and  ?HCAP Day #2) -Ambulate and IS -Wound is 100% clean with excellent granulation tissue, no significant drainage.  Can probably d/c wound vac upon discharge since healing so well.   -Appreciate hospitalist help in managing this patient.  PCM - start TPN, prealbumin is 7.9 Bipolar -Switched PO meds back to recommended IV/IM psych meds as recommended by Dr. Elsie Saas last admission -Appreciate psych consult -This had a large effect on her compliance with medical care during her last admission Hypokalemia -This was a chronic issue on last admission, pharmacy following, mg normal Anemia of chronic disease -To follow up as an outpatient LE Edema Pleural Effusion/HCAP? - Per Dr. Thedore Mins talked to Dr. Cristy Hilts Vanc/zosyn for now DVT proph - SCD's and lovenox      Nonie Hoyer 06/18/2015, 9:22 AM Pager: [phone]

## 2015-06-19 LAB — BASIC METABOLIC PANEL
Anion gap: 6 (ref 5–15)
CHLORIDE: 108 mmol/L (ref 101–111)
CO2: 26 mmol/L (ref 22–32)
CREATININE: 0.75 mg/dL (ref 0.44–1.00)
Calcium: 7.9 mg/dL — ABNORMAL LOW (ref 8.9–10.3)
GFR calc Af Amer: >60 mL/min (ref >60)
GFR calc non Af Amer: >60 mL/min (ref >60)
GLUCOSE: 113 mg/dL — AB (ref 65–99)
POTASSIUM: 3.3 mmol/L — AB (ref 3.5–5.1)
SODIUM: 140 mmol/L (ref 135–145)

## 2015-06-19 LAB — PHOSPHORUS: PHOSPHORUS: 3 mg/dL (ref 2.5–4.6)

## 2015-06-19 LAB — MAGNESIUM: MAGNESIUM: 2 mg/dL (ref 1.7–2.4)

## 2015-06-19 LAB — GLUCOSE, CAPILLARY
GLUCOSE-CAPILLARY: 120 mg/dL — AB (ref 65–99)
GLUCOSE-CAPILLARY: 91 mg/dL (ref 65–99)
Glucose-Capillary: 102 mg/dL — ABNORMAL HIGH (ref 65–99)
Glucose-Capillary: 103 mg/dL — ABNORMAL HIGH (ref 65–99)

## 2015-06-19 LAB — CBC
HCT: 22.7 % — ABNORMAL LOW (ref 36.0–46.0)
HEMOGLOBIN: 7.6 g/dL — AB (ref 12.0–15.0)
MCH: 28.6 pg (ref 26.0–34.0)
MCHC: 33.5 g/dL (ref 30.0–36.0)
MCV: 85.3 fL (ref 78.0–100.0)
Platelets: 752 10*3/uL — ABNORMAL HIGH (ref 150–400)
RBC: 2.66 MIL/uL — AB (ref 3.87–5.11)
RDW: 17.1 % — ABNORMAL HIGH (ref 11.5–15.5)
WBC: 13.1 10*3/uL — ABNORMAL HIGH (ref 4.0–10.5)

## 2015-06-19 LAB — VANCOMYCIN, TROUGH: Vancomycin Tr: 15 ug/mL (ref 10.0–20.0)

## 2015-06-19 MED ORDER — FAT EMULSION 20 % IV EMUL
240.0000 mL | INTRAVENOUS | Status: AC
  Administered 2015-06-19: 240 mL via INTRAVENOUS
  Filled 2015-06-19: qty 250

## 2015-06-19 MED ORDER — TRACE MINERALS CR-CU-MN-SE-ZN 10-1000-500-60 MCG/ML IV SOLN
INTRAVENOUS | Status: AC
  Administered 2015-06-19: 17:00:00 via INTRAVENOUS
  Filled 2015-06-19: qty 960

## 2015-06-19 MED ORDER — POTASSIUM CHLORIDE 10 MEQ/50ML IV SOLN
10.0000 meq | INTRAVENOUS | Status: AC
  Administered 2015-06-19 (×6): 10 meq via INTRAVENOUS
  Filled 2015-06-19 (×6): qty 50

## 2015-06-19 NOTE — Progress Notes (Signed)
PROGRESS NOTE  Bernadene BellMelanie Sesay AVW:[id number] DOB: 04-17-1971 DOA: 06/17/2015 PCP: No primary care provider on file.  HPI/Recap of past 24 hours:  Patient is a 44 year old female past medical history of bipolar and major depressive disorder as well as recent perforated gastric ulcer status post exploratory laparotomy and closure with patch drain placement by general surgery admitted by general surgery on 12/12 with progressively worsening abdominal pain and CT findings consistent with perforation of ulcer. Prior to admission, patient was on by mouth antibiotics for pneumonia and on exam found to have pleural effusion. Hospitalist consulted.  Psychiatry consulted for bipolar disorder. Recommended for when necessary Geodon while nothing by mouth. Patient currently on bowel rest plus NG tube plus TPN and antibiotics with pus drained by interventional radiology on 12/12.  Pleural effusion not felt to be parapneumonic and could be sympathetic from her GI issue with secondary compressive atelectasis. Stable with no signs of acute pleuritic chest pain or respiratory failure. Patient self complains of generalized pain all the time.  Assessment/Plan: Principal Problem:   Bipolar I disorder, most recent episode mixed [facility](HCC): Being followed by psychiatry. On when necessary Geodon. So far no doses needed. If she starts, will need telemetry Active Problems:   Acute gastric perforation (HCC) colon secondary to noncompliance of diet following recent surgical correction. On bowel rest plus TPN plus antibiotics status post surgical drain   Anemia   Hypokalemia   HCAP (healthcare-associated pneumonia) with secondary atelectasis plus effusion. Continued on broad-spectrum antibiotics which will cover both GI and pulmonary at this point. No evidence of pleuritic chest pain or shortness of breath or respiratory failure requiring pulmonary consult.    Code Status: Full code   Family Communication: No family  present   Disposition Plan: Management as per surgery    Consultants:  Psychiatry  Hospitalists  Interventional radiology   Procedures:  Status post draining of abdominal abscess at site of perforated ulcer   Antibiotics:  Vancomycin 12/12-present  Meropenem 12/12-present    Objective: BP 119/74 mmHg  Pulse 99  Temp(Src) 98.9 F (37.2 C) (Oral)  Resp 16  Ht 5\' 8"  (1.727 m)  Wt 117.7 kg (259 lb 7.7 oz)  BMI 39.46 kg/m2  SpO2 96%  LMP 04/04/2015  Intake/Output Summary (Last 24 hours) at 06/19/15 1645 Last data filed at 06/19/15 1322  Gross per 24 hour  Intake    215 ml  Output   2515 ml  Net  -2300 ml   Filed Weights   06/17/15 0345 06/17/15 1730  Weight: 115.667 kg (255 lb) 117.7 kg (259 lb 7.7 oz)    Exam:   General:  Alert and oriented 2, flattened affect   Cardiovascular: Regular rate and rhythm, S1 and S2   Respiratory: Clear to auscultation bilaterally   Abdomen: Soft, absent bowel sounds   Musculoskeletal: Trace pitting edema    Data Reviewed: Basic Metabolic Panel:  Recent Labs Lab 06/13/15 0445 06/14/15 0608 06/17/15 0428 06/17/15 1215 06/18/15 0455 06/19/15 0440  NA 141 140 138  --  142 140  K 3.2* 3.3* 2.9*  --  3.3* 3.3*  CL 109 108 106  --  106 108  CO2 23 23 24   --  24 26  GLUCOSE 92 109* 106*  --  90 113*  BUN <5* <5* <5*  --  <5* <5*  CREATININE 0.77 0.69 0.73  --  0.82 0.75  CALCIUM 8.0* 7.7* 7.9*  --  8.0* 7.9*  MG  --   --   --  1.7 2.1 2.0  PHOS  --   --   --  3.7 3.5 3.0   Liver Function Tests:  Recent Labs Lab 06/17/15 0428 06/18/15 0455  AST 21 24  ALT 13* 14  ALKPHOS 142* 138*  BILITOT 0.5 0.6  PROT 5.8* 6.3*  ALBUMIN 1.7* 1.8*    Recent Labs Lab 06/17/15 0428  LIPASE 36   No results for input(s): AMMONIA in the last 168 hours. CBC:  Recent Labs Lab 06/14/15 0608 06/15/15 0358 06/17/15 0428 06/18/15 0455 06/19/15 0440  WBC 16.6* 17.4* 15.0* 15.6* 13.1*  NEUTROABS  --   --   --   10.0*  --   HGB 8.7* 8.6* 7.7* 8.2* 7.6*  HCT 24.7* 24.6* 22.7* 23.7* 22.7*  MCV 84.3 84.5 83.8 85.3 85.3  PLT 516* 534* 597* 748* 752*   Cardiac Enzymes:   No results for input(s): CKTOTAL, CKMB, CKMBINDEX, TROPONINI in the last 168 hours. BNP (last 3 results)  Recent Labs  06/17/15 0428  BNP 56.5    ProBNP (last 3 results) No results for input(s): PROBNP in the last 8760 hours.  CBG:  Recent Labs Lab 06/18/15 1045 06/18/15 1720 06/18/15 2344 06/19/15 0624 06/19/15 1147  GLUCAP 88 107* 102* 120* 91    Recent Results (from the past 240 hour(s))  Culture, blood (routine x 2)     Status: None (Preliminary result)   Collection Time: 06/17/15  8:38 AM  Result Value Ref Range Status   Specimen Description BLOOD LEFT FOREARM  Final   Special Requests BOTTLES DRAWN AEROBIC AND ANAEROBIC  Final   Culture NO GROWTH 2 DAYS  Final   Report Status PENDING  Incomplete  Culture, blood (routine x 2)     Status: None (Preliminary result)   Collection Time: 06/17/15  8:40 AM  Result Value Ref Range Status   Specimen Description BLOOD RIGHT ANTECUBITAL  Final   Special Requests BOTTLES DRAWN AEROBIC AND ANAEROBIC  Final   Culture NO GROWTH 2 DAYS  Final   Report Status PENDING  Incomplete  Culture, routine-abscess     Status: None (Preliminary result)   Collection Time: 06/17/15  4:38 PM  Result Value Ref Range Status   Specimen Description ABSCESS  Final   Special Requests LLQ CT DRAIN  Final   Gram Stain   Final    ABUNDANT WBC PRESENT, PREDOMINANTLY PMN NO SQUAMOUS EPITHELIAL CELLS SEEN NO ORGANISMS SEEN Performed at [facility]    Culture   Final    NO GROWTH 1 DAY Performed at [facility]    Report Status PENDING  Incomplete     Studies: No results found.  Scheduled Meds: . antiseptic oral rinse  7 mL Mouth Rinse q12n4p  . budesonide-formoterol  2 puff Inhalation BID  . chlorhexidine  15 mL Mouth Rinse BID  . enoxaparin (LOVENOX)  injection  40 mg Subcutaneous Daily  . hydrOXYzine  50 mg Intramuscular QHS  . insulin aspart  0-9 Units Subcutaneous 4 times per day  . meropenem (MERREM) IV  1 g Intravenous Q8H  . vancomycin  1,000 mg Intravenous Q8H    Continuous Infusions: . Marland KitchenTPN (CLINIMIX-E) Adult 30 mL/hr at 06/18/15 1722   And  . fat emulsion 240 mL (06/18/15 1722)  . Marland KitchenTPN (CLINIMIX-E) Adult     And  . fat emulsion       Time spent: 15 minutes   Hollice Espy  [facility] Pager [phone] . If 7PM-7AM, please  contact night-coverage at [email or web address], password [facility] 06/19/2015, 4:45 PM  LOS: 1 day

## 2015-06-19 NOTE — Progress Notes (Addendum)
PARENTERAL NUTRITION & ANTIBIOTIC CONSULT NOTE -Follow-up  Pharmacy Consult for TPN; Vancomycin & Merrem Indication: Gastric perforation; HCAP +/- intra-abdominal infection  Allergies  Allergen Reactions  . Erythromycin Other (See Comments)    Tongue swelling   . Sulfa Antibiotics     swelling    Patient Measurements: Height: 5' 8" (172.7 cm) Weight: 259 lb 7.7 oz (117.7 kg) IBW/kg (Calculated) : 63.9 Adjusted Body Weight: 77 kg BMI: 39  Vital Signs: Temp: 98.9 F (37.2 C) (12/14 0458) Temp Source: Oral (12/14 0458) BP: 119/74 mmHg (12/14 0458) Pulse Rate: 99 (12/14 0856)  Labs:  Recent Labs  06/17/15 0428 06/17/15 1148 06/18/15 0455 06/19/15 0440  WBC 15.0*  --  15.6* 13.1*  HGB 7.7*  --  8.2* 7.6*  HCT 22.7*  --  23.7* 22.7*  PLT 597*  --  748* 752*  APTT  --  41*  --   --   INR  --  1.50*  --   --      Recent Labs  06/17/15 0428 06/17/15 1215 06/18/15 0450 06/18/15 0455 06/19/15 0440  NA 138  --   --  142 140  K 2.9*  --   --  3.3* 3.3*  CL 106  --   --  106 108  CO2 24  --   --  24 26  GLUCOSE 106*  --   --  90 113*  BUN <5*  --   --  <5* <5*  CREATININE 0.73  --   --  0.82 0.75  CALCIUM 7.9*  --   --  8.0* 7.9*  MG  --  1.7  --  2.1 2.0  PHOS  --  3.7  --  3.5 3.0  PROT 5.8*  --   --  6.3*  --   ALBUMIN 1.7*  --   --  1.8*  --   AST 21  --   --  24  --   ALT 13*  --   --  14  --   ALKPHOS 142*  --   --  138*  --   BILITOT 0.5  --   --  0.6  --   PREALBUMIN  --   --   --  7.9*  --   TRIG  --   --  99  --   --    Estimated Creatinine Clearance: 118.5 mL/min (by C-G formula based on Cr of 0.75).    Recent Labs  06/18/15 1720 06/18/15 2344 06/19/15 0624  GLUCAP 107* 102* 120*    Insulin Requirements in the past 24 hours:  None at this time  Assessment: 44 year old female with recent history gastric perforation s/p exlap and patch repair of gastric ulcer perforation on 11/29. Patient was discharged on 12/10 with soft oral diet. Now  re-admitted 12/12 with progressive worsening of abdominal pain, chills, nausea, anorexia, and loose stools and CT shows defect of the lesser curve of the stomach with air bubbles concerning for perforated gastric ulcer, abscess or post-operative seroma.   Surgeries/Procedures: 12/12: Perc abd drain in IR   GI: GERD, gastric ulcers, perf -abd drain output 90cc/24hrs; NGO 1100 cc (up).  Albumin 1.8. Prealbumin low at 7.9. Poor po intake 2/2 abd pain, but good BM pta. LBM 06/14/15. BS and Flatus. Healing well and possible plan to d/c wound vac on discharge- timing still unclear.   Endo: CBGs 88-120, controlled on SSI  Lytes: K low/unchanged (3.3) after 3 runs 12/12  and 4 runs on 12/13, Mg & Phos wnl but trending down, CoCa 9.66 (CaxPhos <55) - at risk for refeeding. Renal: SCr 0.75,off IVF, I/O: 1800/3100; UOP 0.5 cc/kg/hr recorded last 24 hrs.  Pulm: R-pleural effusion with possible thoracentesis if not improving over next few days.  Cards: BP wnl, ST up to 117. 12/13 ECHO- EF 55-60%.  Hepatobil: Alk phos trending back down, others wnl-low. Tbili wnl. TG 99. Neuro: Bipolar disorder.  ID: HCAP +/- intraabdominal infection on vancomycin and merrem. WBC down, afebrile, LA wnl. LLQ abscess drained 12/12 in IR- 20 mL purulent aspirate.   12/12 Blood >> ngtd 12/12 Abscess cx >> ngtd  12/12 Vanc >>       VT 12/14 = 15 on 1g IV q8h -at goal at C[facility], drawn correctly.  12/12 Merrem >>  Best Practices: Lovenox 40 >> Low for BMI and now POD #15, wound vac still in place - consider adjustment, will check with surgery.    TPN Access: PICC placement 12/13 TPN start date: 12/13 >> present  Current Nutrition:  TPN @ 30 cc/hr   Nutritional Goals:  Per RD recommendations on 06/18/15: Kcal 2100-2300, 115-130 grams protein, Fluid >2.1L   Plan:  Increase Clinimix E 5/15 at 40mL/hr and IVFE at 10mL/hr (currently providing 55% Kcal and 42% protein needs) -advancing slowly due to high risk for refeeding.  Approximate goal 83 mL/hr. Daily multivitamin and trace elements Continue sensitive SSI q6h Supplement K+ with 60 mEq IV today per protocol and lab trend.  Follow-up TPN labs in AM Monitor electrolytes closely as at high risk for refeeding   Continue Vancomycin at 1g IV every 8 hours.  Continue Merrem 1g IV every 8 hours.  Monitor renal function, clinical status, and culture results.  Assess daily for LOT.    , PharmD, BCPS Clinical Pharmacist [phone] 06/19/2015,8:59 AM   

## 2015-06-19 NOTE — Progress Notes (Signed)
Patient ID: Anita Villarreal, female   DOB: Feb 16, 1971, 44 y.o.   MRN: [id number]    Subjective: Pt with depressed affect.  Wants NGT out.  Passing flatus.  Objective: Vital signs in last 24 hours: Temp:  [98.2 F (36.8 C)-98.9 F (37.2 C)] 98.9 F (37.2 C) (12/14 0458) Pulse Rate:  [98-102] 99 (12/14 0856) Resp:  [15-16] 16 (12/14 0856) BP: (114-128)/(72-76) 119/74 mmHg (12/14 0458) SpO2:  [96 %-98 %] 96 % (12/14 0856) Last BM Date: 06/14/15  Intake/Output from previous day: 12/13 0701 - 12/14 0700 In: 1804.3 [I.V.:863.8; IV Piggyback:700; TPN:230.5] Out: 2490 [Urine:1300; Emesis/NG output:1100; Drains:90] Intake/Output this shift: Total I/O In: -  Out: 500 [Urine:500]  PE: Abd: soft, obese, VAC in place, few BS, NGT with minimal output right now, but says 1100 in 24 hrs, ? How much ice she is eating, drain in place with tan purulent drainage  Lab Results:   Recent Labs  06/18/15 0455 06/19/15 0440  WBC 15.6* 13.1*  HGB 8.2* 7.6*  HCT 23.7* 22.7*  PLT 748* 752*   BMET  Recent Labs  06/18/15 0455 06/19/15 0440  NA 142 140  K 3.3* 3.3*  CL 106 108  CO2 24 26  GLUCOSE 90 113*  BUN <5* <5*  CREATININE 0.82 0.75  CALCIUM 8.0* 7.9*   PT/INR  Recent Labs  06/17/15 1148  LABPROT 18.2*  INR 1.50*   CMP     Component Value Date/Time   NA 140 06/19/2015 0440   K 3.3* 06/19/2015 0440   CL 108 06/19/2015 0440   CO2 26 06/19/2015 0440   GLUCOSE 113* 06/19/2015 0440   BUN <5* 06/19/2015 0440   CREATININE 0.75 06/19/2015 0440   CALCIUM 7.9* 06/19/2015 0440   PROT 6.3* 06/18/2015 0455   ALBUMIN 1.8* 06/18/2015 0455   AST 24 06/18/2015 0455   ALT 14 06/18/2015 0455   ALKPHOS 138* 06/18/2015 0455   BILITOT 0.6 06/18/2015 0455   GFRNONAA >60 06/19/2015 0440   GFRAA >60 06/19/2015 0440   Lipase     Component Value Date/Time   LIPASE 36 06/17/2015 0428       Studies/Results: Dg Chest 2 View  06/18/2015  CLINICAL DATA:  44 year old female with  history of cough and fever since yesterday evening. EXAM: CHEST  2 VIEW COMPARISON:  06/17/2015. FINDINGS: A nasogastric tube is seen extending into the stomach, however, the tip of the nasogastric tube extends below the lower margin of the image. Persistent airspace consolidation in the right lower lobe. Moderate right and small left pleural effusions. No evidence of pulmonary edema. Mild cardiomegaly. Upper mediastinal contours are within normal limits. IMPRESSION: 1. Nasogastric tube extending into the stomach. 2. Persistent right lower lobe airspace consolidation concerning for pneumonia with moderate right parapneumonic pleural effusion. Small left pleural effusion. Electronically Signed   By: Trudie Reed M.D.   On: 06/18/2015 07:37   Ct Image Guided Drainage By Percutaneous Catheter  06/17/2015  CLINICAL DATA:  Gastric perforation, with postop abdominal fluid collections. Largest peripherally enhancing collection in the left lower quadrant anteriorly adjacent to the body wall. EXAM: CT GUIDED PERITONEAL ABSCESS DRAIN CATHETER PLACEMENT ANESTHESIA/SEDATION: Intravenous Fentanyl and Versed were administered as conscious sedation during continuous cardiorespiratory monitoring by the radiology RN, with a total moderate sedation time of 10 minutes. PROCEDURE: The procedure risks, benefits, and alternatives were explained to the patient. Questions regarding the procedure were encouraged and answered. The patient understands and consents to the procedure. select axial scans obtained through  the mid abdomen. The dominant, peripherally enhancing left lower quadrant collection was localized and an appropriate skin entry site was determined and marked. The operative field was prepped with chlorhexidinein a sterile fashion, and a sterile drape was applied covering the operative field. A sterile gown and sterile gloves were used for the procedure. Local anesthesia was provided with 1% Lidocaine. Under CT  fluoroscopic guidance, a 19 gauge percutaneous entry needle advanced into the collection. Purulent material could be aspirated. A guidewire advanced easily within the collection, position confirmed on CT. Tract dilated to facilitate placement of a 12 French pigtail catheter, formed centrally within the collection. 20 mL of purulent aspirate were removed and sent for Gram stain, culture and sensitivity. The catheter was secured externally with 0 Prolene suture and StatLock and placed to gravity drain bag. The patient tolerated the procedure well. COMPLICATIONS: None immediate FINDINGS: CT confirms the 9.4 cm intraperitoneal left lower quadrant collection adjacent to the anterior body wall. 12French pigtail drain catheter placed as above. Sample sent for Gram stain, culture and sensitivity. IMPRESSION: Technically successful CT-guided peritoneal abscess drain catheter placement. Electronically Signed   By: Corlis Leak  Hassell M.D.   On: 06/17/2015 16:58    Anti-infectives: Anti-infectives    Start     Dose/Rate Route Frequency Ordered Stop   06/17/15 1700  vancomycin (VANCOCIN) IVPB 1000 mg/200 mL premix     1,000 mg 200 mL/hr over 60 Minutes Intravenous Every 8 hours 06/17/15 0829     06/17/15 1700  piperacillin-tazobactam (ZOSYN) IVPB 3.375 g  Status:  Discontinued     3.375 g 12.5 mL/hr over 240 Minutes Intravenous 3 times per day 06/17/15 0832 06/17/15 1225   06/17/15 1300  meropenem (MERREM) 1 g in sodium chloride 0.9 % 100 mL IVPB     1 g 200 mL/hr over 30 Minutes Intravenous Every 8 hours 06/17/15 1227     06/17/15 0845  vancomycin (VANCOCIN) 2,000 mg in sodium chloride 0.9 % 500 mL IVPB     2,000 mg 250 mL/hr over 120 Minutes Intravenous  Once 06/17/15 0829 06/17/15 1205   06/17/15 0845  piperacillin-tazobactam (ZOSYN) IVPB 3.375 g     3.375 g 100 mL/hr over 30 Minutes Intravenous  Once 06/17/15 0832 06/17/15 0947   06/17/15 0830  piperacillin-tazobactam (ZOSYN) IVPB 3.375 g  Status:  Discontinued      3.375 g 12.5 mL/hr over 240 Minutes Intravenous 3 times per day 06/17/15 [id number] 06/17/15 [id number]       Assessment/Plan Perforated lesser curve gastric ulcer with purulent contamination of the abdominal cavity POD #15 s/p Ex Lap with omental patch repair, drain placement Intraabdominal fluid collections concerning for abscess -Ir drain in place with purulent drainage, cont for now -NPO, NG tube, bowel rest, IVF, pain control, antiemetics (Vanc/zosyn - for intraabdominal infection and ?HCAP Day #3)  However, she does have some BS and is passing flatus.  ? Need for NGT.  She had high output, but wonder if this is from ice as her films do not show a significantly dilated bowel. -Ambulate and IS -Wound is 100% clean with excellent granulation tissue, no significant drainage. Can probably d/c wound vac upon discharge since healing so well.  -Appreciate hospitalist help in managing this patient. PCM - start TPN, prealbumin is 7.9 Bipolar -Switched PO meds back to recommended IV/IM psych meds as recommended by Dr. Elsie SaasJonnalagadda last admission -Appreciate psych consult -This had a large effect on her compliance with medical care during her last admission Hypokalemia -This  was a chronic issue on last admission, pharmacy following, mg normal -replace Anemia of chronic disease -To follow up as an outpatient LE Edema Pleural Effusion/HCAP? -cont Vanc/zosyn for now DVT proph - SCD's and lovenox   LOS: 1 day    Argel Pablo E 06/19/2015, 9:58 AM Pager: [phone]

## 2015-06-19 NOTE — Discharge Summary (Signed)
[facility] Discharge Summary   Patient ID: Anita Villarreal MRN: [id number] DOB/AGE: [birth year] 44 y.o.  Admit date: 06/04/2015 Discharge date: 06/15/2015  Admitting Diagnosis: Acute gastric perforation with feculent peritonitis Leukocytosis Elevated lactic acid   Discharge Diagnosis Patient Active Problem List   Diagnosis Date Noted  . Anemia 06/17/2015  . Hypokalemia 06/17/2015  . HCAP (healthcare-associated pneumonia) 06/17/2015  . Pneumonia 06/17/2015  . Bipolar I disorder, most recent episode mixed (HCC) 06/07/2015  . Acute gastric perforation (HCC) 06/04/2015    Consultants Dr. Elsie Saas - Psychiatry  Imaging: Dg Chest 2 View  06/18/2015  CLINICAL DATA:  44 year old female with history of cough and fever since yesterday evening. EXAM: CHEST  2 VIEW COMPARISON:  06/17/2015. FINDINGS: A nasogastric tube is seen extending into the stomach, however, the tip of the nasogastric tube extends below the lower margin of the image. Persistent airspace consolidation in the right lower lobe. Moderate right and small left pleural effusions. No evidence of pulmonary edema. Mild cardiomegaly. Upper mediastinal contours are within normal limits. IMPRESSION: 1. Nasogastric tube extending into the stomach. 2. Persistent right lower lobe airspace consolidation concerning for pneumonia with moderate right parapneumonic pleural effusion. Small left pleural effusion. Electronically Signed   By: Trudie Reed M.D.   On: 06/18/2015 07:37   Ct Image Guided Drainage By Percutaneous Catheter  06/17/2015  CLINICAL DATA:  Gastric perforation, with postop abdominal fluid collections. Largest peripherally enhancing collection in the left lower quadrant anteriorly adjacent to the body wall. EXAM: CT GUIDED PERITONEAL ABSCESS DRAIN CATHETER PLACEMENT ANESTHESIA/SEDATION: Intravenous Fentanyl and Versed were administered as conscious sedation during continuous cardiorespiratory  monitoring by the radiology RN, with a total moderate sedation time of 10 minutes. PROCEDURE: The procedure risks, benefits, and alternatives were explained to the patient. Questions regarding the procedure were encouraged and answered. The patient understands and consents to the procedure. select axial scans obtained through the mid abdomen. The dominant, peripherally enhancing left lower quadrant collection was localized and an appropriate skin entry site was determined and marked. The operative field was prepped with chlorhexidinein a sterile fashion, and a sterile drape was applied covering the operative field. A sterile gown and sterile gloves were used for the procedure. Local anesthesia was provided with 1% Lidocaine. Under CT fluoroscopic guidance, a 19 gauge percutaneous entry needle advanced into the collection. Purulent material could be aspirated. A guidewire advanced easily within the collection, position confirmed on CT. Tract dilated to facilitate placement of a 12 French pigtail catheter, formed centrally within the collection. 20 mL of purulent aspirate were removed and sent for Gram stain, culture and sensitivity. The catheter was secured externally with 0 Prolene suture and StatLock and placed to gravity drain bag. The patient tolerated the procedure well. COMPLICATIONS: None immediate FINDINGS: CT confirms the 9.4 cm intraperitoneal left lower quadrant collection adjacent to the anterior body wall. 12French pigtail drain catheter placed as above. Sample sent for Gram stain, culture and sensitivity. IMPRESSION: Technically successful CT-guided peritoneal abscess drain catheter placement. Electronically Signed   By: Corlis Leak M.D.   On: 06/17/2015 16:58    Procedures Dr. Luisa Hart (06/04/15) - Exploratory laparotomy closure of gastric ulcer using omental patch  Hospital Course:  44 y/o AA female with PMH Bipolar/MDD, was brought to [facility] with acute onset of severe diffuse abdominal pain.  No  vomiting.  Admission between 06/04/15 to 06/15/15 for perforated gastric ulcer at the lesser curve with purulent contamination. She underwent Ex Lap, closure of gastric ulcer with  omental patch drain placement by Dr. Luisa Hartornett on 06/04/15.   She was kept NPO for several days to allow her repair to heal, but unfortunately the patients bipolar had a great effect on her behavior post-operatively, and she acted against medical advice and endangered her surgical repair.  She was found letting her ice melt and drinking large amounts of water and stealing ice to drink from her ice bag.  This was evident because of the output in her NG canister.  Because of this her ice intake had to be closely monitored.  She also pulled our her NG tube early, this was left out because of the gastric repair and she refused to let one go back in.   We consulted Psychiatry to help us manager her bipolar while needing to be NPO and Dr. Elsie SaasJonnalagadda made adjustments in her psych meds.  She underwent an UGI (06/08/15) and she was found to have no leak.  She was kept on IV antibiotics rocephin/flagyl/diflucan for 7 days due to her gross food contamination to her abdomen.  Her diet was slowly advanced as tolerated as her ileus resolved.  Home psych meds were able to be resumed.  She was maintained on ProtonixA repeat CT scan was obtained due to leukocytosis on 06/12/15 which found perihepatic ascites.  These collections were thought to be postsurgical and was not thought to be abscess.  No contrast extravasation was noted.  Mild ileus noted.  The blake drain which was placed during surgery and the output trended down to minimal sanguinous drainage 20mL on POD #10 without purulence.  She developed what was thought to be pneumonia and she was discontinued off IV antibiotics and switched to Levaquin daily.  Her SOB, oxygen saturations, and effort on her IS improved.  On POD #10 she continued to have leukocytosis and tachycardia, thought to be due to  her pneumonia.  On POD #11, the patient was WBC was slightly up, but no further fevers and the patient was clinically improved.  She was okay'ed by the surgical staff to discharge home after blake drain removed.  She was discharged home with a wound vac in place, protonix and pepcid for ulcer prevention, and Levaquin to finish hour her course for Pneumonia.  She will follow up in the office with Dr. Luisa Hartornett in 2-3 weeks for a post-op check.       Medication List    TAKE these medications        albuterol 108 (90 BASE) MCG/ACT inhaler  Commonly known as:  PROVENTIL HFA;VENTOLIN HFA  Inhale 2 puffs into the lungs every 6 (six) hours as needed. Wheezing     albuterol (2.5 MG/3ML) 0.083% nebulizer solution  Commonly known as:  PROVENTIL  Take 2.5 mg by nebulization every 6 (six) hours as needed for wheezing.     budesonide-formoterol 80-4.5 MCG/ACT inhaler  Commonly known as:  SYMBICORT  Inhale 2 puffs into the lungs 2 (two) times daily.     dicyclomine 20 MG tablet  Commonly known as:  BENTYL  Take 20 mg by mouth 4 (four) times daily.     famotidine 10 MG tablet  Commonly known as:  PEPCID  Take 1 tablet (10 mg total) by mouth 2 (two) times daily as needed for heartburn or indigestion.     gabapentin 100 MG capsule  Commonly known as:  NEURONTIN  Take 100 mg by mouth 2 (two) times daily.     hydrOXYzine 50 MG tablet  Commonly known as:  ATARAX/VISTARIL  Take 50 mg by mouth at bedtime.     levofloxacin 750 MG tablet  Commonly known as:  LEVAQUIN  Take 1 tablet (750 mg total) by mouth daily.     lithium carbonate 300 MG capsule  Take 300 mg by mouth 2 (two) times daily.     methocarbamol 500 MG tablet  Commonly known as:  ROBAXIN  Take 2 tablets (1,000 mg total) by mouth every 8 (eight) hours as needed for muscle spasms (or pain).     oxyCODONE-acetaminophen 7.5-325 MG tablet  Commonly known as:  PERCOCET  Take 1-2 tablets by mouth every 4 (four) hours as needed.      pantoprazole 40 MG tablet  Commonly known as:  PROTONIX  Take 1 tablet (40 mg total) by mouth 2 (two) times daily before a meal.     promethazine 25 MG tablet  Commonly known as:  PHENERGAN  Take 25 mg by mouth every 6 (six) hours as needed for nausea or vomiting.     SUMAtriptan 100 MG tablet  Commonly known as:  IMITREX  Take 100 mg by mouth every 2 (two) hours as needed. migraines     ziprasidone 80 MG capsule  Commonly known as:  GEODON  Take 80 mg by mouth 2 (two) times daily with a meal.             Follow-up Information    Follow up with CORNETT,THOMAS A., MD. Go in 3 weeks.   Specialty:  General Surgery   Why:  For post-operation check on 06/26/15 at 10:45 am arriving at 10:15 am to check in and fill out paperwork.   Contact information:   [address] [place] [address] [phone]       Follow up with [name], MD. Schedule an appointment as soon as possible for a visit in 2 weeks.   Specialty:  Internal Medicine   Why:  For post-hospital follow up   Contact information:   [address] [place] [address] [phone]       Signed: Bradd Canary [facility] [phone]  Delayed encounter due to being in the on call provider's box.  I was not involved in her care on the day of discharge.  Information for this discharge was obtained by reviewing the chart.  06/19/2015, 12:54 PM

## 2015-06-19 NOTE — Progress Notes (Signed)
Discussed with Dr. Magnus IvanBlackman will try clamping trials today and see how she does.

## 2015-06-20 LAB — COMPREHENSIVE METABOLIC PANEL
ALBUMIN: 1.6 g/dL — AB (ref 3.5–5.0)
ALT: 21 U/L (ref 14–54)
AST: 49 U/L — AB (ref 15–41)
Alkaline Phosphatase: 137 U/L — ABNORMAL HIGH (ref 38–126)
Anion gap: 8 (ref 5–15)
BUN: <5 mg/dL — ABNORMAL LOW (ref 6–20)
CHLORIDE: 107 mmol/L (ref 101–111)
CO2: 24 mmol/L (ref 22–32)
CREATININE: 0.67 mg/dL (ref 0.44–1.00)
Calcium: 7.9 mg/dL — ABNORMAL LOW (ref 8.9–10.3)
GFR calc non Af Amer: >60 mL/min (ref >60)
Glucose, Bld: 110 mg/dL — ABNORMAL HIGH (ref 65–99)
Potassium: 3.4 mmol/L — ABNORMAL LOW (ref 3.5–5.1)
SODIUM: 139 mmol/L (ref 135–145)
Total Bilirubin: 0.5 mg/dL (ref 0.3–1.2)
Total Protein: 5.9 g/dL — ABNORMAL LOW (ref 6.5–8.1)

## 2015-06-20 LAB — GLUCOSE, CAPILLARY
GLUCOSE-CAPILLARY: 98 mg/dL (ref 65–99)
GLUCOSE-CAPILLARY: 99 mg/dL (ref 65–99)
Glucose-Capillary: 107 mg/dL — ABNORMAL HIGH (ref 65–99)
Glucose-Capillary: 112 mg/dL — ABNORMAL HIGH (ref 65–99)
Glucose-Capillary: 115 mg/dL — ABNORMAL HIGH (ref 65–99)

## 2015-06-20 LAB — CBC
HCT: 22.4 % — ABNORMAL LOW (ref 36.0–46.0)
Hemoglobin: 7.5 g/dL — ABNORMAL LOW (ref 12.0–15.0)
MCH: 28.5 pg (ref 26.0–34.0)
MCHC: 33.5 g/dL (ref 30.0–36.0)
MCV: 85.2 fL (ref 78.0–100.0)
PLATELETS: 749 10*3/uL — AB (ref 150–400)
RBC: 2.63 MIL/uL — AB (ref 3.87–5.11)
RDW: 17 % — ABNORMAL HIGH (ref 11.5–15.5)
WBC: 13.7 10*3/uL — AB (ref 4.0–10.5)

## 2015-06-20 LAB — PHOSPHORUS: PHOSPHORUS: 3.3 mg/dL (ref 2.5–4.6)

## 2015-06-20 LAB — MAGNESIUM: Magnesium: 1.8 mg/dL (ref 1.7–2.4)

## 2015-06-20 MED ORDER — MAGNESIUM SULFATE 2 GM/50ML IV SOLN
2.0000 g | Freq: Once | INTRAVENOUS | Status: AC
  Administered 2015-06-20: 2 g via INTRAVENOUS
  Filled 2015-06-20: qty 50

## 2015-06-20 MED ORDER — ENOXAPARIN SODIUM 60 MG/0.6ML ~~LOC~~ SOLN
0.5000 mg/kg | Freq: Every day | SUBCUTANEOUS | Status: DC
Start: 2015-06-20 — End: 2015-06-28
  Administered 2015-06-21 – 2015-06-28 (×7): 60 mg via SUBCUTANEOUS
  Filled 2015-06-20 (×7): qty 0.6

## 2015-06-20 MED ORDER — TRACE MINERALS CR-CU-MN-SE-ZN 10-1000-500-60 MCG/ML IV SOLN
INTRAVENOUS | Status: AC
  Administered 2015-06-20: 19:00:00 via INTRAVENOUS
  Filled 2015-06-20: qty 1440

## 2015-06-20 MED ORDER — POTASSIUM CHLORIDE 10 MEQ/50ML IV SOLN
10.0000 meq | INTRAVENOUS | Status: AC
  Administered 2015-06-20 (×6): 10 meq via INTRAVENOUS
  Filled 2015-06-20 (×6): qty 50

## 2015-06-20 MED ORDER — FAT EMULSION 20 % IV EMUL
240.0000 mL | INTRAVENOUS | Status: AC
  Administered 2015-06-20: 240 mL via INTRAVENOUS
  Filled 2015-06-20: qty 250

## 2015-06-20 NOTE — Progress Notes (Signed)
Patient ID: Anita BellMelanie Villarreal, female   DOB: [birth year], 44 y.o.   MRN: [id number]    Subjective: Pt sleeping a lot today.  Did well with clamping trials and only had 100cc residual.  Still having BMs and flatus  Objective: Vital signs in last 24 hours: Temp:  [98.5 F (36.9 C)-98.6 F (37 C)] 98.5 F (36.9 C) (12/15 0548) Pulse Rate:  [79-95] 95 (12/15 0548) Resp:  [19-20] 20 (12/15 0548) BP: (123-126)/(72-82) 123/72 mmHg (12/15 0548) SpO2:  [96 %-98 %] 96 % (12/15 0735) Last BM Date: 06/14/15  Intake/Output from previous day: 12/14 0701 - 12/15 0700 In: 1555 [P.O.:120; NG/GT:150; IV Piggyback:800; TPN:480] Out: 2220 [Urine:2000; Emesis/NG output:150; Drains:70] Intake/Output this shift:    PE: Abd: soft, still bloated, but obese as well, +BS, drain with purulent tan drainage Heart: regular Lungs: CTAB  Lab Results:   Recent Labs  06/19/15 0440 06/20/15 0513  WBC 13.1* 13.7*  HGB 7.6* 7.5*  HCT 22.7* 22.4*  PLT 752* 749*   BMET  Recent Labs  06/19/15 0440 06/20/15 0513  NA 140 139  K 3.3* 3.4*  CL 108 107  CO2 26 24  GLUCOSE 113* 110*  BUN <5* <5*  CREATININE 0.75 0.67  CALCIUM 7.9* 7.9*   PT/INR  Recent Labs  06/17/15 1148  LABPROT 18.2*  INR 1.50*   CMP     Component Value Date/Time   NA 139 06/20/2015 0513   K 3.4* 06/20/2015 0513   CL 107 06/20/2015 0513   CO2 24 06/20/2015 0513   GLUCOSE 110* 06/20/2015 0513   BUN <5* 06/20/2015 0513   CREATININE 0.67 06/20/2015 0513   CALCIUM 7.9* 06/20/2015 0513   PROT 5.9* 06/20/2015 0513   ALBUMIN 1.6* 06/20/2015 0513   AST 49* 06/20/2015 0513   ALT 21 06/20/2015 0513   ALKPHOS 137* 06/20/2015 0513   BILITOT 0.5 06/20/2015 0513   GFRNONAA >60 06/20/2015 0513   GFRAA >60 06/20/2015 0513   Lipase     Component Value Date/Time   LIPASE 36 06/17/2015 0428       Studies/Results: No results found.  Anti-infectives: Anti-infectives    Start     Dose/Rate Route Frequency Ordered Stop   06/17/15 1700  vancomycin (VANCOCIN) IVPB 1000 mg/200 mL premix     1,000 mg 200 mL/hr over 60 Minutes Intravenous Every 8 hours 06/17/15 0829     06/17/15 1700  piperacillin-tazobactam (ZOSYN) IVPB 3.375 g  Status:  Discontinued     3.375 g 12.5 mL/hr over 240 Minutes Intravenous 3 times per day 06/17/15 0832 06/17/15 1225   06/17/15 1300  meropenem (MERREM) 1 g in sodium chloride 0.9 % 100 mL IVPB     1 g 200 mL/hr over 30 Minutes Intravenous Every 8 hours 06/17/15 1227     06/17/15 0845  vancomycin (VANCOCIN) 2,000 mg in sodium chloride 0.9 % 500 mL IVPB     2,000 mg 250 mL/hr over 120 Minutes Intravenous  Once 06/17/15 0829 06/17/15 1205   06/17/15 0845  piperacillin-tazobactam (ZOSYN) IVPB 3.375 g     3.375 g 100 mL/hr over 30 Minutes Intravenous  Once 06/17/15 0832 06/17/15 0947   06/17/15 0830  piperacillin-tazobactam (ZOSYN) IVPB 3.375 g  Status:  Discontinued     3.375 g 12.5 mL/hr over 240 Minutes Intravenous 3 times per day 06/17/15 0829 06/17/15 [id number]       Assessment/Plan  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  -DC NGT and give clear liquids -Ambulate and IS  -Appreciate hospitalist help in managing this patient. PCM - start TPN, prealbumin is 7.9 Bipolar -Switched PO meds back to recommended IV/IM psych meds as recommended by Dr. Elsie Saas last admission -Appreciate psych consult -This had a large effect on her compliance with medical care during her last admission Hypokalemia -This was a chronic issue on last admission, pharmacy following, mg normal -replace again today Anemia of chronic  disease -To follow up as an outpatient LE Edema Pleural Effusion/HCAP? -cont Vanc/zosyn for now DVT proph - SCD's and lovenox ( )  LOS: 2 days    Lainee Lehrman E 06/20/2015, 9:02 AM Pager: [phone]

## 2015-06-20 NOTE — Progress Notes (Signed)
Gastric residual = 100cc. Placed pt back to [facility].

## 2015-06-20 NOTE — Progress Notes (Signed)
Pt's NGT was clamped for 6 hrs, pt tolerating well, checked gastric output prior to connecting to wall suction, got <MEASUREM098Argent(8422)C82laXBBhc [id number] GPaticGen96 Three Rivers SurgicalWinnif19283Ventura Endoscopy Ce09<MEASUREMENTLaurell Braulio Clinica909 71Mei Surgery Center PLLC Dba Michigan Eye Surgery CenteWhite Mountain Regional Medical [phone] Outpatient Surgery Of Oklaho100ma LMort Theora GianBridgette Hab7C82laude4(828XBMWUEast Memphis Urology Center Dba UrocentMarshZO6500110NelCentral Valley Surgical CentZOXW22Sharene Sk70e1El PWayneMisty StanleyDan Humph2330-Dimas A771610MGorBon Secours Surgery Center At Harbour View LLC CecilMonroeville Ambulatory SurgeryKenv749629Medical Center Surgery Associat56 [facility] -51<[id number] Children'S Psychiatric JuCharity fundrais098Argent851180Journey Lit00110Nelwy47nGPatLido B<MEASUREMENTWindom Area 098<MEASUREMENTLaurell Braulio Cli9143Louis[place] Morgan City Ltd Dba Surgecenter Of Louisvill098[place] ([phone]) [facility] 3SystMort Theora GianBridgette Hab3C17laude321XBMWULong Island Digestive Endoscopy CentCrete AZO3900110NelFremont HospitZOXW50Sharene Sk10e1El PWayneMisty StanleyDan CecilSt Josephs Outpatient SurgeryKinn339629Coastal Behavioral H65 Outpatient Surgery Cen58<[id number] Ambulatory Surgical CentJuCharity fundraiserRexene EdisonniuShirlee L0981191[facility] St [facility] CenSelena Batt<MEASUREMENTLaurell Braulio Cli(207)82Bone And Joint Institute Of [place] Surgery Center LLCukrowski Surgery Center 66PcUniversity098[place] ([phone](442) Olean General Ho84spitMort Theora GianBridgette Hab5C61laude1(51XBMWUBelau National HospitThe Surgery CenterZO1900110NelMercy Health MuskegZOXW98Sharene Sk84e1El PWayneMisty StanleyDan Humph8(512)Dimas A461610MCecilKindred Rehabilitation HospitalKenhor769629Provo Canyon Behavioral Hos6 Riverwalk Surgery40<[id number] Worth Medical JuCharity fundraiserRexene EdisonniuShirlee L0981191Allegheny Clinic Dba Ahn Westmoreland Endoscopy CeSelena Batt<MEASUREMENTLaurell Braulio Clini780-73Story County Hospital NortNorth Miami Beach Surg098Argentina7826501-Colonial Outpatient Surgery 66CentMort Theora GianBridgette Hab7C49laude372XBMWUSt. Albans Community Living CentWyominZO3400110NelMethodist Healthcare - Memphis HospitZOXW(60Sharene Sk8e1El PWayneMisty StanleyDan Humph8726-Dimas A731610MGorMidCecilMuenster MemoriRosela679629Encompass Health Reading Rehabilitation Hos78 Anthony Medical44<[id number] Hills Surgery CentJuCharity fundraiserRexene EdisonniuShirlee L0981191Center For Specialty Surgery Of AuSelena Batt<MEASUREMENTLa573-27Olympia 098Argentina7634404-Piedmont Walton Hospit83al IMort Theora GianBridgette Hab7C27laude791XBMWUKaweah Delta Rehabilitation HospitDayton CZO5800110NelMoundview Mem Hsptl And CliniZOXW57Sharene Sk60e1El PWayneMisty StanleyDan Hump814-DCecilFranklin County MedCane Be759629Baptist Memorial [facility] - Carroll C25 Kindred [facility] Palm 45<[id number] Holt Memorial HoJuCharity fundraiserRexene EdisonniuShirlee L0981191Hereford Regional Medical CeSelena Batt<MEASUREMENTLaurell Braulio Clinical research asso30ciaCoralee Nor289-09Walnut Hill Surgery CenteFlorham Park Endoscopy Cen098[place] [phone] Mainegeneral Medical Center4-SetMort Theora GianBridgette Hab7C54laude597XBMWUFairfield Surgery Center LThe Aesthetic SZO200110NelSkyline HospitZOXW(508Sharene Sk52e1El PWayneMisty StanleyDan Humph(6133Dimas A251610MGorEncomCecilUpmc NorthweYabuc799629Prince Frederick Surgery Cente55 Coral Springs Ambulatory Surgery Cen5<[id number] Specialty Hospital-Northeast OhiJuCharity fundraiserRexene EdisonniuShirlee L0981191Jewish Selena Batt<MEASUREMENTLaurell Braulio Clinic(669)46Cumberland Valley Surgical Center LLSaddleback Memorial Medical Center - San Clemen86teSt. Jose098Argentina603-66660-Pecos Valley Eye Surgery Cent35er LMort Theora GianBridgette Hab8C6laude485XBMWUMountain View Surgical Center Millard FillmoreZO5800110NelBell Memorial HospitZOXW(82Sharene Sk3e1El PWayneMisty StanleyDan Humph8(41CecilTexas Rehabilitation Hospital OfWisconsin Del589629Vision Care Of Mainearoostoo56 Department Of State Hospital-Metro34<[id number] Community HoJuCharity fundraiserRexene EdisonniuShirlee L0981191Memorial Hermann Surgery Center BrazoriaSelena Batt<MEASUREMENTLaurell Braulio Clinic(414)44Franklin Foundation HospitaValir Rehabilitation Hospital Of O61kcOttowa Regional Hospital And Hea098[place] (([phone] Fort Washington Ho33spitMort Theora GianBridgette Hab(9C23laude677XBMWUSt Louis Womens Surgery Center LParklZO6800110NelPark Central Surgical Center LZOXW(23Sharene Sk73e1El PWayneMisty StanleyDan Humph(3909-Dimas A341610MGorLaguna HonCecilSurprise Valley CommuniEast Cant399629Brighton Surgery Cente67 Bay Pines Va Medical40<MEASUREMWinnifred([id number] Endoscopy CentJuCharity fundraiserRexene EdisonniuShirlee L0981191Riverside Endoscopy CenterSelena Batt<MEASUREMENTLaurell Braulio Cl312-06Zion Eye Institute InPremier Surgery Center Of Louisville LP Dba Premier Sur098[place] ([phone]-The Orthopedic Specialty Ho52spitMort Theora GianBridgette Hab(4C90laude4(61XBMWUNorthwestern Medicine Mchenry Woodstock Huntley HospitAdult And Childrens SurgeZO200110NelEllicott City Ambulatory Surgery Center LlZOXW57Sharene Sk34e1El PWayneMisty StanleyDan HuCecilThe Mackool Eye InCedar Fal419629Kaiser Fnd Hosp - San R23 Geisinger Endoscopy And Surg56<[id number] Children’S Medical JuCharity fundraiserRexene EdisonniuShirlee L0981191Weslaco Rehabilitation HospSelena Batt<MEASUREMENTLaurell Braul(567)44Mclean Hospital CorporatioElmhurst Outpatient Surgery Center L098Argenti(641) 56419 [place] A18sc LMort Theora GianBridgette Hab(4C7laude6(848XBMWUMidwest Center For Day SurgeVa Middle TennesseeZO3800110NelIredell Memorial Hospital, IncorporatZOXW(30Sharene Sk45e1El PWayneMisty StanleyDan Humph(81443-Dimas ACecilDhhs Phs Ihs Tucson AreaFloyda99629Artesia General Hos78 Chicago Endoscopy37<[id number] Good Samaritan Medical JuCharity fundraiserRexene EdisonniuShirlee L0981191The University HospSelena Batt<MEASUREMENTLaurell Braulio Clinical research 7543Hca Houston Healthcare KingwooKootenai Outpatient Surge65ryAlegent Creighton Heal098Argenti(779) 17678-Thomas Jefferson University Ho26spitMort Theora GianBridgette Hab2C70laude7(57XBMWUTennova Healthcare - ClarksvilTrinity ZO2500110NelNew York Methodist HospitZOXW40Sharene Sk35e1El PWayneMisty StanleyDan HCecilCape Regional MedLynnd759629Hans P Peterson Memorial Hos25 Coffee County Center For Digestive Disea68<[id number] Austin Medical JuCharity fundraiserRexene EdisonniuShirlee L0981191Hot Springs Rehabilitation CeSelena Batt<MEASUREMENTLaurell Brau860-66Mt Pleasant Surgical CenteLindenhurs098Argentina484-54865-Rivendell Behavioral Health Se73rvicMort Theora GianBridgette Hab7C30laude453XBMWUNew Braunfels Spine And Pain SurgeThe Corpus Christi Medical ZO1900110NelJervey Eye Center LZOXW95Sharene Sk61e1El PWayneMisty StanleyDan HumpCecilBallinger MemoriCirc699629Tanner Medical Center/East Al3 Southwest General H69<[id number] Health Harris Methodist HospitaJuCharity fundraiserRexene EdisonniuShirlee L0981191Corcoran District HospSelena Batt<MEASUREMENTLaurell Braulio Clini(873)71Texas Neurorehab Center BehavioraHca Houston098[place] ((667)32(980) Southern Surgical Ho70spitMort Theora GianBridgette Hab(7C79laude2(33XBMWURose Ambulatory Surgery Center BinghamZO4800110NelSelect Specialty Hospital - MuskegZOXW48Sharene Sk26e1El PWayneMisty StanleyDan Humph3(252)DCecilGlastonbury EndosSouth Rosema69629Swedish Medical Center - Cherry Hill C7 Vernon M. Geddy Jr. Outpatient40<[id number] Mountain HoJuCharity fundraiserRexene EdisonniuShirlee L0981191Mercy Medical CeSelena Batt<MEASUREMENTLaurell Braulio Clin305-39Carolina Pines Regional Me098[place](630)59(712)Hawkins County Memorial Ho74spitMort Theora GianBridgette HaC46laude690XBMWUCornerstone Ambulatory Surgery Center LAdvanced FamZO3600110NelBlackwell Regional HospitZOXW(86Sharene Sk2e1El PWayneMisty StanleCecilSouth Meadows EndoscopyGrovet369629Premier Surgery Center Of Santa 6 Bronx Psychiatric34<[id number] Vista Health JuCharity fundraiserRexene EdisonniuShirlee L0981612-75St. [facility]3-610NWKentuckyGWestside Medical Ce21(706)Russell Regional HospitMort Theora GianBridgette Hab940 57Spectrum Health Kelsey Hospita27HSebastian RiverCecilHiLLCrest Hospita252lLavShirlee LimerMercy Catholic Medical C<MEASUREMENTLaurell Braulio Clinical re989-33Vibra Specialty Hospita098[place] ([phone] [facility] Medical 19CentMort Theora GianBridgette Hab(8C58laude541XBMWUSouthside Regional Medical CentEye Laser And Surgery CentZO4200110NelForbes Ambulatory Surgery Center LZOXW(92Sharene Sk54e1El PWayneMisty StanleyDaCecilHospiBerl649629St Mary Medical Cente45 Banner Desert Surgery63<[id number] Health Rehabilitation Hospital Of ViJuCharity fundraiserRexene EdisonniuShirlee L0981191Cobblestone Surgery CeSelena Batt<MEASUREMENTLaurell Braulio Clini(098[place] [phone] [place] Ho53spitMort Theora GianBridgette Hab7C11laude2(38XBMWUFort Sutter Surgery CentCharlestonZO1900110NelDr. Pila'S HospitZOXW(83Sharene Sk2e1El PWayneMisty StanleyDan Humph9(609)DimaCecilDiscover Vision Surgery And LaserMinata419629Kindred Hospital Indiana55 United Hospital15<[id number] County HoJuCharity fundraiserRexene EdisonniuShirlee L0981191Rio Grande State CeSelena Batt<MEASUREMENTLaurell B(972) 80Nov[facility] Prince William Medical Cente098Argentina681 77520-Mt Carmel [place] Surgical Ho74spitMort Theora GianBridgette Hab(5C35laude564XBMWUBarnes-Jewish Hospital - NorWest Los AngeZO4500110NelAcuity Specialty Hospital Of Southern New JersZOXW(94Sharene Sk76e1El PWayneMisty StanleyDan HumpCecilO'ConnDorchest199629Middle Park Medical C2 Camden General H43<[id number] Surgery Center New River VallJuCharity fundraiserRexene EdisonniuShirlee L0981191Rogers Mem HSelena Batt<MEASUREMENTLaurell Braul516 75Montgomery County Mental Health Treatment FacilitWarm Springs Rehabilitation H098Argentina901383River Point Behavioral 47HealMort Theora GianBridgette Hab7C40laude430XBMWUCampbell County Memorial HospitPediatric SurgeryZO9100110NelSt Vincent HospitZOXW21Sharene Sk8e1El PWayneMisty StanleyCecilGeneral Leonard Wood Army CommuniHolt149629Lehigh Valley Hospital Haz47 Eskenazi85<[id number] Care Center OfJuCharity fundraiserRexene EdisonniuShirlee L0981191Lutheran HospSelena Batt<MEASUREMENTLaurell Braulio Clinical re340-86Wayne Memorial Hos098Argentina757 33(220) Agmg Endoscopy Center A General Partn8ershMort Theora GianBridgette Hab4C55laude2(36XBMWUClarke County Public HospitScriZO600110NelBoston Children'S HospitZOXW(5Sharene Sk5e1El PWayneMisty StanleyDan Humph(95236 Dimas A551610MGorNorthCecilGrove Creek MedDenis399629Templeton Surgery Cente4 Greenwood Leflore H72<[id number] Health Medical Center - LittlJuCharity fundraiserRexene EdisonniuShirlee L0981191Wray Community District HospSelena Batt<MEASUREMENTLaurell Braulio Cl9381Va Medical Center - Syracu098[place] [phone]-Eye Surgery Center Of Michig41an LMort Theora GianBridgette Hab2C43laude5(22XBMWUEastpointe HospitKerrville VZO4900110NelCharleston Surgery Center Limited PartnershZOXW85Sharene Sk79e1El PWayneMisty StanleyDanCecilSaint Lukes South SurgeryNewa579629Waverly Municipal Hos8 Leahi H61<[id number] [place] HoJuCharity fundraiserRexene EdisonniuShirlee L0981191Midtown Endoscopy CenterSelena Batt<MEASUREMENTLaurell Braulio Clinical resear(681)51Franklin Medical CenteWils098Argentina810 66813-Community Memorial Hospital-San Buenav61entuMort Theora GianBridgette Hab(2C29laude(480XBMWUSeneca Healthcare DistriOakes ZO2500110NelCommunity Surgery And Laser Center LZOXW26Sharene Sk56e1El PWayneMisty StanleyDan Humph(26858-Dimas A3216CecilFloyd Cherokee MedSunsit629629Lewis County General Hos10 Bay Area Surgicen11<[id number] Surgery CentJuCharity fundraiserRexene EdisonniuShirlee L0981191Southern Virginia Regional Medical CeSelena Batt<MEASUREMENTLaurell Braulio Clinical r763 82South Nassau Communities Hospital Off Campus Emergency DepSkyline 098Ar[place]ina ([phone]-Perkins County Health Se96rvicMort Theora GianBridgette HaC89laude290XBMWUAnderson Endoscopy CentCorona Regional MZO7500110NelWayne County HospitZOXWSharene Sk98e1El PWayneMisty StanleyDan Hump867-Dimas A171610MGorBayloCecilAtoka County MedHers19629Outpatient Surgery Cente72 Kearney Eye Surgical Cen44<[id number] Specialty Hospital - North KnoJuCharity fundraiserRexene EdisonniuShirlee L0981191Flagler HospSelena Batt<MEASUREMENTLaure773-01Poway Surgery CenteEmory Spine Physiatry Ou098Argentina225072Hancock Regional Ho63spitMort Theora GianBridgette HaC21laude3(76XBMWUSurgcenter Of St LucPocahontas ZO7100110NelPam Specialty Hospital Of LufkZOXW(74Sharene Sk31e1El PWayneMisty StanleyDan Humph(28301CecilRoosevelt MedWilt289629Kearney Ambulatory Surgical Center LLC Dba Heartland Surgery C4 Seabroo36<MEASUREMWinnifred([id number] Lapeer JuCharity fundraiserRexene EdisonniuShirlee L0981191Bergman Eye Surgery CenterSelena Batt<MEASUREMENTLaurell Braulio Clinical res416-60Natraj Surgery Center InCrane 098Argentina304-6990St Marys Hsptl M9ed CMort Theora GianBridgette Hab(2C70laude6(57XBMWUNorthpoint Surgery CRichardZO3800110NelMedical Center Of Peach County, TZOXW(95Sharene Sk56e1El PWayneMisty StanleyDanCecilMiddletown EndoscCassvil209629Vibra Hospital Of Fort 24 North Caddo Medical36<[id number] Mawr HoJuCharity fundraiserRexene EdisonniuShirlee L0981191Eastern Shore Hospital CeSelena Batt<MEASUREMENTLaurell Brau(260)16Seton Shoal Creek HospitaLifecare Hospitals Of South Texas - Mcall098Argentina301-61(760)Mercy Regional Medical 74CentMort Theora GianBridgette Hab7C52laude294XBMWUVirginia Center For Eye SurgeMorrZO6100110NelPark Ridge Surgery Center LZOXW(91Sharene Sk13e1El PWayneMisty StanleyDan HumphCecilCheyenne Regional MedWhite Shie29629Carrollton Sp26 Multicare Valley Hospital And Medical33<[id number] Surgery CentJuCharity fundraiserRexene EdisonniuShirlee L0981191Endsocopy Center Of Middle GeorgiaSelena Batt<MEASUREMENTLaurell Braulio Clinical researc(816)25Va Medical Center - Marion, IRegency Hospital 098[place] ([phone] Val Verde Regional Medical 56CentMort Theora GianBridgette Hab(63C83laude321XBMWUPeacehealth St. Joseph HospitChattanooga Pain Management Center LLC Dba [place] PZO6100110NelEyecare Consultants Surgery Center LZOXWSharene Sk73e1El PWayneMisty StanleyDan Humph476CecilOutpatient Plastic SurTull259629Carris Health LLC-Rice Memorial Hos76 Dubuque Endoscopy Ce63<[id number] Beach Surgical SuitJuCharity fundraiserRexene EdisonniuShirlee L0981191Oak Point Surgical SuitesSelena Batt<MEASUREMENTLaurell Braulio Clinical researc732-28Hampton Va Medical CenteColumbus Regional Health098[place](626) 35(816) Northwestern Lake Forest Ho30spitMort Theora GianBridgette Hab8C56laude5(22XBMWUAlleghany Memorial HosZO6900110NelTexas Health Surgery Center AddisZOXW77Sharene Sk32e1El PWayneMisty StanleyDCecilAbrazo CenGreenwo579629Martinsburg Va Medical C75 Saint Lawrence Rehabilitation68<[id number] Springs Rural Health CJuCharity fundraiserRexene EdisonniuShirlee L0981191New Tampa Surgery CeSelena Batt<MEASUREMENTLaurell Braulio Clini507-11Jefferson Surgery Center Cherry HilHar098[place] (([phone]-Hudson Crossing Surgery 84CentMort Theora GianBridgette HaC16laude740XBMWUBuffalo Surgery Center LJohnstonZO6600110NelPathway Rehabilitation Hospial Of BossiZOXW(484Sharene Sk86e1El PWayneMisty StanleyDan Humph7(CecilAtrium HealtCov239629Eating Recovery C48 Alexander H60<MEASUREMWinnifred([id number] County General HoJuCharity fundraiserRexene EdisonniuShirlee L0981191Platte County Memorial HospSelena Batt<MEASUREMENTLaurell Braulio Clinica479 25Phycare Surgery Center LLC Dba Physicians Care Surgery CenteGulf Coast Endoscopy Center Of Venice L098Argentina828-39304-Gastroenterology Associat85es IMort Theora GianBridgette Hab(9C6laude65XBMWUEden Medical CentDanviZO3700110NelKiowa County Memorial HospitZOXW90Sharene Sk84e1El PWayneMisty StanleyDan Humph(61(323)Dimas A711610MCecilSentara Virginia Beach GenerWarro539629Unity Linden Oaks Surgery Cente45 Divine Savior H85<[id number] ClinJuCharity fundraiserRexene EdisonniuShirlee L0981191Atrium Health LinSelena Batt<MEASUREMENTLaurell Braulio C636-20Fsc Investments LLDowntown098[place] (559-73(310)[facility]ension Se [facility] Franklin 46CampMort Theora GianBridgette Hab(8C72laude380XBMWUOregon Surgical InstituKishwaukee ZO6500110NelParkview HospitZOXW31Sharene Sk56e1El PWayneMisty StanleyDan Humph(8578Dimas A441610CecilIdaho Eye CenteLav529629Oceans Behavioral Hospital Of L3 Osf Healthcaresystem Dba Sacred Heart Medical60<MEASUREMWinnifred([id number] Retina Surgery CentJuCharity fundraiserRexene EdisonniuShirlee L0981191Mary Breckinridge Arh HospSelena Batt<MEASUREMENTLaurell Braulio Cl(667)24Parkway Surgi098[place] (702) 00754-U[place] State Ho56spitMort Theora GianBridgette Hab2C58laude636XBMWUValley Eye Institute Windmoor HealthZO1600110NelLaporte Medical Group Surgical Center LZOXW92Sharene Sk58e1El PWayneMisty StanleyDaCecilTavares Wa639629Hardy Wilson Memorial Hos5 Heritage Eye Surgery Cen85<[id number] [place] HoJuCharity fundraiserRexene EdisonniuShirlee L0981191Memorial Hospital AssociaSelena Batt<MEASUREMENTLaurell Braulio Clinical research asso38ciaCoralee N281-36Sahara Outpatient Su098[place] 725 14(818)Woodbridge Cent26er LMort Theora GianBridgette Hab(57C24laude2(970XBMWUThe Colonoscopy Center IUhs ZO2000110NelCentral Oklahoma Ambulatory Surgical Center IZOXW(3Sharene Sk47e1El PWayneMisty StanleyDan Humph3937CecilLittleton Day SurgeryWading Riv89629Monroe County Hos43 Beach District Surgery Ce63<[id number] City NorthJuCharity fundraiserRexene EdisonniuShirlee L0981191Saint [facility] BartSelena Batt<MEASUREMENTLaurell Br949-14Samaritan HealthcarVibra Hospital Of Far30goDhhs Phs Naih098Argenti220-07(819) Cibola General Ho67spitMort Theora GianBridgette Hab7C35laude272XBMWUHeartland Behavioral Health ServicWildcrZO700110NelAtlantic General HospitZOXW(93Sharene Sk8e1El PWayneMisty StanleyDan Humph3906-DCecilSoutheast Alabama MedForestda659629Arkansas Methodist Medical C55 Bergan Mercy Surgery Cen84<[id number] View Regional HoJuCharity fundraiserRexene EdisonniuShirlee L0981191North Hills SurgicarSelena Batt<MEASUREMENTLaurell Braulio Cli8161Surgery Center Of Farmington LLElite Medical Cent71erBay098Argenti680-49212-Bel Air Ambulatory Surgical Cent38er LMort Theora GianBridgette Hab(54C2laude794XBMWUMemorial Hospital Of GardeCommunity ZO2500110NelDouglas Community Hospital, IZOXW(726Sharene Sk57e1El PWayneMisty StanleyDan HumCecilElite SurgicSt149629Northern Plains Surgery Cente8 Correct Care Of South C31<[id number] North PiJuCharity fundraiserRexene EdisonniuShirlee L0981191North Bay Medical CeSelena Batt<MEASUREMENTLaurell Braulio Cl772-61Good Shepherd Medical CenteWest Sub098Argentina3321681-Pacific Northwest Eye Surgery 12CentMort Theora GianBridgette Hab8C26laude331XBMWUCapital Region Medical CentMichiana BehaviZO1600110NelMarengo Memorial HospitZOXW(76Sharene Sk37e1El PWayneMisty StanleyDan HuCecilMaury RegionOldh699629Butler Memorial Hos43 Osf Healthcaresystem Dba Sacred Heart Medical39<[id number] Outpatient ServicJuCharity fundraiserRexene EdisonniuShirlee L0981191Overlook HospSelena Batt<MEASUREMENTLaurell Braulio Clini(223) 15Carolinas Continuecare At King098Argenti(719) 04682-Uc Health Ambulatory Surgical Center Inverness Orthopedics And Spine Surgery 59CentMort Theora GianBridgette Hab6C60laude3(803XBMWUUpstate University Hospital - Community CampLexingZO7700110NelOaks Surgery Center ZOXW6Sharene Sk43e1El PWayneMisty StanleyDan Humph4845-Dimas A631610MGorBaptist HeCecilBoulder CommuniRanto689629Cedars Surgery Cent89 Decatur County H10<[id number] Medical Center-NortJuCharity fundraiserRexene EdisonniuShirlee L0981191Roswell Park Cancer InstSelena Batt<MEASUREMENTLaurell509-07Kansas Heart HospitaEllenville 098Argenti563 55(909)Plainview Ho15spitMort Theora GianBridgette Hab5C27laude123XBMWUVa San Diego Healthcare SystHarrison Endo SZO3300110NelRehabilitation Hospital Of The NorthweZOXW(709Sharene Sk32e1El PWayneMisty StanleyDan Humph2(2CecilValley Ambulatory SurBrown Stati279629Surgical Specialty C64 Dupont Surgery62<[id number] Medical Center - SanJuCharity fundraiserRexene EdisonniuShirlee L0981191Augusta Endoscopy CeSelena Batt<MEASUREMENTLaurell Braulio Clinical resear919-73University Of Maryland Medicine Asc LLThe Medical Center Of Southeast Texas Beaumont Camp11usDhhs Phs Naihs C098Argentina816-84463-Adventist Medical Center - R54eedlMort Theora GianBridgette Hab2C23laude676XBMWUDe Witt Hospital & Nursing HoAscension Seton SmithvilleZO3600110NelJasper Memorial HospitZOXW(85Sharene Sk59e1El PWayneMisty StanleyDan Humph9581-DiCecilSouthern Alabama SurgeryRio Pin659629Regency Hospital Company Of Macon85 Surgical Serv2<[id number] Des Prairies HoJuCharity fundraiserRexene EdisonniuShirlee L0981191Texas Health Presbyterian Hospital Flower MSelena Batt<MEASUREMENTLaurell Braul616-41Crow Valley Surgery CenteBaylor Medical Ce098Argentina209-82616-Norwalk Surgery Cent41er LMort Theora GianBridgette Hab(5C1laude668XBMWUOhio Valley Ambulatory Surgery Center Select Specialty HosZO6900110NelBaptist Orange HospitZOXW(91Sharene Sk64e1El PWayneMisty StanleyDan HumphCecilVibra Hospital Of SprinMcLeansbo679629Omaha Surgical C66 Boston Eye Surgery And Laser Cente69<[id number] Marys HoJuCharity fundraiserRexene EdisonniuShirlee L0981191Skyline HospSelena Batt<MEASUREMENTLaurell Braulio Clini(404)40Kindred Hospital Indianapo098[place](267) 15(864) Thunderbird Endoscopy 60CentMort Theora GianBridgette Hab(5C17laude777XBMWUPioneer Memorial HospitOak Brook SZO9100110NelMiddle Park Medical Center-GranZOXW(23Sharene Sk60e1El PWayneMisty StanleyDCecilChristus Mother Frances HospiAubu749629Lahey Medical Center - Pe64 Baylor Su73<[id number] HoJuCharity fundraiserRexene EdisonniuShirlee [id number] Specialty HospSelena Batt<MEASUREMENTLaurell Braulio Clinical 2204Eye Surgery Center Of Georgia LLBurgess Memorial Hospit47alJ. D. Mcc098[place] ([phone]-Eye Surgery Center Northla63nd LMort Theora GianBridgette Hab6C68laude125XBMWUBaylor Scott White Surgicare GrapevFaith ZO200110NelOakbend Medical CentZOXW(70Sharene Sk15e1El PWayneMisty StanleyDan Humph(2(930)Dimas A871610MGorECecilMethodist Healthcare - MemphImperi189629Prairie Lakes Hos71 Hackensack-Umc At Pascack30<[id number] Smyrna Beach Ambulatory Care CentJuCharity fundraiserRexene EdisonniuShirlee [id number] Health PinevSelena Batt<MEASUREMENTLaurell Braulio Clinical(858) 03Royal Oaks HospitaPacific Endoscopy And Surgery098[place] 202-25865-Wilson Surgi66centMort Theora GianBridgette Hab(84C45laude5(25XBMWUCataract And Vision Center Of Hawaii LUp HeaZO6000110NelChrist HospitZOXW24Sharene Sk58e1El PWayneMisty StanleyDan Humph(53442-Dimas A51610MGorLakeland SCecilHale CounBelle Hav39629Hampstead Hos62 Nhpe LLC Dba [place] En58<[id number] Community HoJuCharity fundraiserRexene EdisonniuShirlee L0981191[facility] Selena Batt<MEASUREMENTLaurell (717)17Montpelier Surgery CenteMcge098[place](564)79(662)Laser And Surgery Center Of Ac83adiaMort Theora GianBridgette Hab8C47laude270XBMWUMacon County Samaritan Memorial HScoZO6500110NelAspirus Wausau HospitZOXW(7Sharene Sk47e1El PWayneMisty StanleyDan HumCecilHamlin MemoriMar719629Columbus Endoscopy Cente7 [facility] Medical99<[id number] Surgery JuCharity fundraiserRexene EdisonniuShirlee [id number] [facility] Southern HillsSelena Batt<MEASUREMENTLaurell Br6063Campbell County Memorial HospitaThe Eye Surgical Center098[place] 662 64218-[facility] Ho15spitMort Theora GianBridgette Hab(9C30laude4(681XBMWULakeside Medical CentSt Lukes BZO3700110NelHickory Trail HospitZOXW(31Sharene Sk53e1El PWayneMisty StanleyDan HumphCecilSurgery Center OfLake Myst699629Sunrise C66 [facility]47<[id number]astro[facility]ology Dba [facility] [place] Endoscopy JuCharity fundraiserRexene EdisonniuShirlee [id number] Regional HospSelena Batt<MEASUREMENTLaurell Braulio Clinical re540-30[facility] O098[place]ina6475334-[facility] Surgery 50CentMort Theora GianBridgette Hab7C2laude9XBMWUMadison County Healthcare SystHighlandZO2100110NelGulf Coast Endoscopy Center Of Venice LZOXW61Sharene Sk28e1El PWayneMisty StanleyDan Humph([phone])DimasCecilPine Creek MedSugarcre539629[facility] Ga42 Ellis H42<[id number] HoJuCharity fundraiserRexene EdisonniuShirlee [id number] Selena Batt<MEASUREMENTLaurell 240-34Lowndes Ambulatory Surgery CenteThe Eye Clinic Surgery Cent34e098[place] [phone]-[facility] Clea20r LaMort Theora GianBridgette Hab7C53laude2(929XBMWUUniversity Of Md Shore Medical Center At EastVidaZO2300110NelParkway Surgery Center Dba Parkway Surgery Center At Horizon RidZOXW4Sharene Sk26e1El PWayneMisty StanleyDan Humph(7562-Dimas CecilBoston ChildrenRockwo389629Mercy Medical C65 [place] Surgical Sui19<[id number] VillJuCharity fundraiserRexene EdisonniuShirlee [id number] New MeSelena Batt<MEASUREMENTLaurell [phone] [phone]-[place] Ho40spitMort Theora GianBridgette Hab(3C49laude5(904XBMWUPennsylvania Psychiatric InstituMosZO6000110NelCommunity Health Center Of Branch CounZOXW3Sharene Sk26e1El PWayneMisty StanleyDan Humph5979-DimCecilMemoriSun Riv679629Northern [facility] Memorial Hos54 Pondera Medical40<[id number] Memorial HoJuCharity fundraiserRexene Ediso(873)NWKentuckyGNorthbank Surgica331Findlay Surgery Cente4438wMnh Gi Surgical Center [phone] LimerOutpatient Surgery Center Of La <MEASUREMENTLaurell Braulio Clinical resea3156Oconee Surgery CenteChan Soon Shiong Medical Center At [phone] [facility] Port90 HurMort Theora GianBridgette Hab(58C76laude881XBMWUSerenity Springs Specialty HospitEndoscopy Center OfZO3500110NelCleveland Asc LLC Dba Cleveland Surgical SuitZOXW(251Sharene Sk60e1El PWayneMisty StanleyDan Humph8512-Dimas CecilSummers County AHarrisonbu539629Olympia Medical C4 [place] Medical27<[id number] Ambulatory Surgery CentJuCharity fundraiserRexene EdisonniuShirlee L0981191Monteflore Nyack HospSelena Batt<MEASUREMENTLaurell Braulio Cli805-79[facility] Of PlanCentral Delaware Endoscopy Unit L37LCHeart And Vascular Surg17ica098[place]ina713 72(970)[facility] Ho51spitMort Theora GianBridgette Hab8C7laude320XBMWUDevereux Texas Treatment NetwoTennova HealthZO6900110NelHardeman County Memorial HospitZOXW(31Sharene Sk22e1El PWayneMisty StanleyDan Hump91Dimas CecilEncompass Health Rehabilitation Hospital OWeavervil569629Suburban Hos41 Carrus Specialty H51<MEASUREMWinnifred([id number] Ambulatory Surgery JuCharity fundraiserRexene EdisonniuShirlee [id number] Health CabaSelena Batt<MEASUREMENTLaurell Braulio Cli479-11Adventist Health Ukiah ValleTruxtun Surgery [phone]-[facility] 73CentMort Theora GianBridgette Hab(8C41laude460XBMWUDelaware Eye Surgery Center LCeZO2500110NelYork HospitZOXW80Sharene Sk31e1El PWayneMisty StanleyDan Hump530CecilAdvocate South SuburbVentu719629Highland Hos14 Centura [facility]-St [facility] Medical104<[id number] GastroenterologJuCharity fundraiserRexene EdisonniuShirlee L0981191[facility] ChSelena Batt<MEASUREMENTLaurell Braulio Clinical rese2695Port St Lucie HospitaTri City Ortho098[place] ([phone] [place] Surgery 55CentMort Theora GianBridgette Hab3C80laude622XBMWUEncinitas Endoscopy Center LHolzer MediZO6500110NelCherry County HospitZOXW(56Sharene Sk50e1El PWayneMisty StanleyDan Humph(8701-Dimas A561610CecilMidwest Specialty SurgeryScotchto759629East Valley Endo46 Kyle Er & H64<[id number] Laser And Surgery Center AJuCharity fundraiserRexene EdisonniuShirlee [id number] Dba Arizona Digestive InstSelena Batt<MEASUREMENTLaurell Braulio 978-64P[facility] Medical CenteEncompass Health Rehabilitation [phone]-[facility] Be34nd IMort Theora GianBridgette Hab2C77laude480XBMWUCapital City Surgery Center LSsm Health St. Anthony HospZO3900110NelHosp Dr. Cayetano Coll Y TosZOXW(785Sharene Sk68e1El PWayneMisty StanleyDan Humph4(352) DimCecilCapital Regional MedHobson Ci489629Co[facility]ville Regional Medical C83 Ivinson Memorial H47<MEASUREMWinnifred([id number] Eye HoJuCharity fundraiserRexene EdisonniuShirlee L0981191Hawaii State HospSelena Batt<MEASUREMENTLaurell Brau([phone]) 74Terrebonne General Medical CenteNorthshore Ambulatory098[place] ([phone]-North Valley Endoscopy 71CentMort Theora GianBridgette Hab(61C40laude5(65XBMWUCypress Outpatient Surgical Center ID. W. McmillanZO2800110NelHoly Redeemer Ambulatory Surgery Center LZOXW(51Sharene Sk27e1El PWayneMisty StanleyDan HCecilGarrettBrow189629Mirage Endoscopy Cent27 Olando Va Medical14<[id number] Of State Hospital - CoJuCharity fundraiserRexene EdisonniuShirlee [id number]'S Mercy HospSelena Batt<MEASUREMENTLaurell Braulio Cli431-64Geisinger -Lewistown HospitaScottsdale H098[place] [phone]-The Medical Center At Scott28svilMort Theora GianBridgette Hab(95C8laude26XBMWUYamhill Valley Surgical Center IMuskogeeZO5700110NelWake Forest Joint Ventures LZOXW4Sharene Sk97e1El PWayneMisty StaCecilThe Surgery And EndoscopySan Ildefonso Pueb689629Community Hospital83 Laredo Digestive Health Cen4<[id number] Of Glen BurnJuCharity fundraiserRexene EdisonniuShirlee [id number] [facility] HospSelena Batt<MEASUREMENTLaurell Braulio Clinical research asso71ciaCor317-17[facility] Wes098[place]([phone] [phone]-[facility] 30CentMort Theora GianBridgette Hab(3C69laude3XBMWUMiddletown Endoscopy Asc LSurgery CZO7200110NelSouthwest Georgia Regional Medical CentZOXW(228Sharene Sk45e1El PWayneMisty StanleyDan HumphCecilMerit HeAlice Acr869629Twin Lakes Regional Medical C6 Gastrointestinal Cen74<MEASUREMWinnifred([id number] Surgery CJuCharity fundraiserRexene EdisonniuShirlee [id number] Medical CeSelena Batt<MEASUREMENTLaurell Braulio Clinical669-13Orthopedic Surgery Center Of Oc LLClev098[place] [phone])Parkwest Surgery Cent60er LMort Theora GianBridgette Hab2C66laude4(878XBMWURidgeview InstituAssumption ZO5600110NelReno Orthopaedic Surgery Center LZOXW36Sharene Sk90e1El PWayneMisty StanleyDan Humph32CecilCommunity HosOhkay Owing469629Lb Surgical Cente82 [place] H65<[id number] [place] Evaluation And Treatment JuCharity fundraiserRexene EdisonniuShirlee [id number] Valley Specialty HospSelena Batt<MEASUREMENTLaurell Brau737-48St John Medical CenteAdventhealth Oca30laSurgi098[place]([phone]-Atrium Health Univ74ersiMort Theora GianBridgette Hab2C3laude431XBMWUCataract And Laser Center Associates Memorialcare Miller Childrens AZO1200110NelAdvanced Eye Surgery CentZOXW2Sharene Sk20e1El PWayneMisty StanleyDan Humph(4CecilMethodist Medical Center OBlasde349629Midland Surgical Cente88 University [facility] [place] Medicine 71<[id number] Surgery JuCharity fundraiserRexene EdisonniuShirlee L0981191Telecare [facility] CeSelena Batt<MEASUREMENTLaurell Braulio [phone] Digestive 098Ar[place]i646-84719-[facility] Ho65spitMort Theora GianBridgette Hab(2C13laude86XBMWUHarlan Arh HospitWillough ZO5000110NelJacobson Memorial Hospital & Care CentZOXW(430Sharene Sk26e1El PWayneMisty StanleyDan Humph(4CecilNeospine Puyallup SpineEdwardsvil699629Medina Regional Hos22 [facility] ([facility] [place] [facility] 40<MEASUREMWinnifred([id number] HospitaJuCharity fundraiserRexene EdisonniuShirlee [id number] [facility] Adult Mental Health ServSelena Batt<MEASUREMENTLaurell Braulio Clinical309-03[facility] [phone]-Northern [facility] Center For Advanced Endosco23py LMort Theora GianBridgette Hab9C34laude6XBMWUAthol Memorial HospitLouisville Pinckney Ltd Dba SurgeceZO7400110NelEye Specialists Laser And Surgery Center IZOXW43Sharene Sk35e1El PWayneMisty CecilHiLLCreSportmans Shor509629Lawrence Medical C1 Perimeter Center For Outpatient Sur85<[id number] HoJuCharity fundraiserRexene EdisonniuShirlee [id number] Valley Medical CeSelena Batt<MEASUREMENTLaurell Braulio Clinical resear212-07Pacifica Hospital Of The ValleP098[place]([phone] Surgery 72CentMort Theora GianBridgette Hab2C55laude585XBMWUOlympia Medical CentBay State Wing Memorial Hospital AZO8200110NelSt. John Rehabilitation Hospital Affiliated With HealthsouZOXW701Sharene Sk65e1El PWayneMisty StanleyDan Humph([phone])DimaCecilKaiser Permanente Baldwin Park MedNorthmo569629Metro Health Hos52 [place] H36<[id number] Community HoJuCharity fundraiserRexene EdisonniuShirlee [id number] Surgery CeSelena Batt<MEASUREMENTLaurell B219-57[facility]enter LLHeart And Vascular Surgical Ce098[place] (([phone]-Hamilton Medical 67CentMort Theora GianBridgette Hab(9C48laude2XBMWUOceans Behavioral Hospital Of Deridd96Th Medical GrZO3500110NelFort Madison Community HospitZOXW66Sharene Sk26e1El PWayneMisty StanleyDan CecilKindred Hospital ISomervil819629Alliancehealth Cl57 [place] H56<[id number] Of Central FloriJuCharity fundraiserRexene EdisonniuShirlee [id number] Pines Regional Medical CeSelena BattenTrey Sailorsso Adie591610MGorPeters Endoscopy Centerd77Paticia Sta78KCordelia 

## 2015-06-20 NOTE — Care Management Note (Signed)
Case Management Note  Patient Details  Name: Bernadene BellMelanie Kleinman MRN: [id number] Date of Birth: 13-Jun-1971  Subjective/Objective:                    Action/Plan: Prior to admission patient has active with [facility] for [facility] CenterHR[facility] CenterVAC dressing changes .   Will need order order for home health RN and wound care instructions before  discharge.   Expected Discharge Date:                  Expected Discharge Plan:  Home w Home Health Services  In-House Referral:     Discharge planning Services     Post Acute Care Choice:    Choice offered to:     DME Arranged:    DME Agency:     HH Arranged:    HH Agency:     Status of Service:  In process, will continue to follow  Medicare Important Message Given:    Date Medicare IM Given:    Medicare IM give by:    Date Additional Medicare IM Given:    Additional Medicare Important Message give by:     If discussed at Long Length of Stay Meetings, dates discussed:    Additional Comments:  [name], Karrie Fluellen Marie, RN 06/20/2015, 10:07 AM

## 2015-06-20 NOTE — Progress Notes (Signed)
PARENTERAL NUTRITION CONSULT NOTE -Follow-up  Pharmacy Consult for TPN Indication: Gastric perforation  Allergies  Allergen Reactions  . Erythromycin Other (See Comments)    Tongue swelling   . Sulfa Antibiotics     swelling    Patient Measurements: Height: 5' 8"  (172.7 cm) Weight: 259 lb 7.7 oz (117.7 kg) IBW/kg (Calculated) : 63.9 Adjusted Body Weight: 77 kg BMI: 39  Vital Signs: Temp: 98.5 F (36.9 C) (12/15 0548) Temp Source: Oral (12/15 0548) BP: 123/72 mmHg (12/15 0548) Pulse Rate: 95 (12/15 0548)  Labs:  Recent Labs  06/17/15 1148 06/18/15 0455 06/19/15 0440 06/20/15 0513  WBC  --  15.6* 13.1* 13.7*  HGB  --  8.2* 7.6* 7.5*  HCT  --  23.7* 22.7* 22.4*  PLT  --  748* 752* 749*  APTT 41*  --   --   --   INR 1.50*  --   --   --      Recent Labs  06/18/15 0450 06/18/15 0455 06/19/15 0440 06/20/15 0513  NA  --  142 140 139  K  --  3.3* 3.3* 3.4*  CL  --  106 108 107  CO2  --  24 26 24   GLUCOSE  --  90 113* 110*  BUN  --  <5* <5* <5*  CREATININE  --  0.82 0.75 0.67  CALCIUM  --  8.0* 7.9* 7.9*  MG  --  2.1 2.0 1.8  PHOS  --  3.5 3.0 3.3  PROT  --  6.3*  --  5.9*  ALBUMIN  --  1.8*  --  1.6*  AST  --  24  --  49*  ALT  --  14  --  21  ALKPHOS  --  138*  --  137*  BILITOT  --  0.6  --  0.5  PREALBUMIN  --  7.9*  --   --   TRIG 99  --   --   --    Estimated Creatinine Clearance: 118.5 mL/min (by C-G formula based on Cr of 0.67).    Recent Labs  06/19/15 1656 06/20/15 0002 06/20/15 0659  GLUCAP 103* 107* 112*    Insulin Requirements in the past 24 hours:  None at this time  Assessment: 44 year old female with recent history gastric perforation s/p exlap and patch repair of gastric ulcer perforation on 11/29. Patient was discharged on 12/10 with soft oral diet. Now re-admitted 12/12 with progressive worsening of abdominal pain, chills, nausea, anorexia, and loose stools and CT shows defect of the lesser curve of the stomach with air  bubbles concerning for perforated gastric ulcer, abscess or post-operative seroma.   Surgeries/Procedures: 12/12: Perc abd drain in IR - 77m purulent fluid aspirated from abscess  GI: GERD, gastric ulcers, perf -abd drain output decreased; NG tube clamped-tolerating well. Albumin down 1.6. Prealbumin low at 7.9. LBM 06/14/15. BS and Flatus. Healing well and possible plan to d/c wound vac on discharge- timing still unclear.  Endo: CBGs controlled without need for insulin. SSI ordered but not given.  Lytes: K low/unchanged (3.4) after 6 runs on 12/14, Mg & Phos wnl but trending down, CoCa 9.82 (CaxPhos <55) - at risk for refeeding. Renal: SCr 0.67, off IVF, I/O: 1655/2335; good UOP 0.7 cc/kg/hr recorded last 24 hrs.  Pulm: R-pleural effusion- no plan for thoracentesis at this time, doing well on RA.  Cards: BP wnl, ST up to 117. 12/13 ECHO- EF 55-60%.  Hepatobil: Mild bump in  AST and ALT today, Alk phos trending back down,Tbili wnl. TG 99 on 12/13. Neuro: Bipolar disorder- depressed affect. ID: HCAP +/- intraabdominal infection (abx d4) on vancomycin and merrem. WBC unchanged today (13.7), afebrile, LA wnl.   12/12 Blood >> ngtd 12/12 Abscess cx >> ngtd  12/12 Vanc >>       VT 12/14 = 15 on 1g IV q8h -at goal at Css, drawn correctly.  12/12 Merrem >>  Best Practices: Lovenox 40 >> Low for BMI and now POD #15, wound vac still in place - consider adjustment, will check with surgery.    TPN Access: PICC placement 12/13 TPN start date: 12/13 >> present  Current Nutrition:  TPN @ 30 cc/hr   Nutritional Goals:  Per RD recommendations on 06/18/15: Kcal 2100-2300, 115-130 grams protein, Fluid >2.1L   Plan:  Increase Clinimix E 5/15 to 60 mL/hr and IVFE at 98m/hr (currently providing 72% Kcal and 63% protein needs) -advancing slowly due to high risk for refeeding. Approximate goal 83 mL/hr. Daily multivitamin and trace elements Continue sensitive SSI q6h Repeat 6 runs of potassium IV  today as minimal improvement after 6 runs yesterday and consistently low on lab trend.  Give magnesium 2g IV today.  Follow-up TPN labs - repeat BMET, Mg, and Phos in AM due to risk for refeeding.   JSloan Leiter PharmD, BCPS Clinical Pharmacist [phone]/15/2016,7:53 AM

## 2015-06-21 LAB — BASIC METABOLIC PANEL
ANION GAP: 8 (ref 5–15)
BUN: <5 mg/dL — ABNORMAL LOW (ref 6–20)
CALCIUM: 8 mg/dL — AB (ref 8.9–10.3)
CO2: 21 mmol/L — AB (ref 22–32)
Chloride: 105 mmol/L (ref 101–111)
Creatinine, Ser: 0.6 mg/dL (ref 0.44–1.00)
Glucose, Bld: 114 mg/dL — ABNORMAL HIGH (ref 65–99)
Potassium: 3.6 mmol/L (ref 3.5–5.1)
SODIUM: 134 mmol/L — AB (ref 135–145)

## 2015-06-21 LAB — CBC
HEMATOCRIT: 24.8 % — AB (ref 36.0–46.0)
Hemoglobin: 8.4 g/dL — ABNORMAL LOW (ref 12.0–15.0)
MCH: 29.4 pg (ref 26.0–34.0)
MCHC: 33.9 g/dL (ref 30.0–36.0)
MCV: 86.7 fL (ref 78.0–100.0)
Platelets: 709 10*3/uL — ABNORMAL HIGH (ref 150–400)
RBC: 2.86 MIL/uL — ABNORMAL LOW (ref 3.87–5.11)
RDW: 17.3 % — AB (ref 11.5–15.5)
WBC: 12.6 10*3/uL — AB (ref 4.0–10.5)

## 2015-06-21 LAB — MAGNESIUM: MAGNESIUM: 1.9 mg/dL (ref 1.7–2.4)

## 2015-06-21 LAB — CULTURE, ROUTINE-ABSCESS: CULTURE: NO GROWTH

## 2015-06-21 LAB — GLUCOSE, CAPILLARY
GLUCOSE-CAPILLARY: 106 mg/dL — AB (ref 65–99)
Glucose-Capillary: 103 mg/dL — ABNORMAL HIGH (ref 65–99)
Glucose-Capillary: 100 mg/dL — ABNORMAL HIGH (ref 65–99)

## 2015-06-21 LAB — PHOSPHORUS: PHOSPHORUS: 3.1 mg/dL (ref 2.5–4.6)

## 2015-06-21 MED ORDER — ZIPRASIDONE HCL 80 MG PO CAPS
80.0000 mg | ORAL_CAPSULE | Freq: Two times a day (BID) | ORAL | Status: DC
  Administered 2015-06-21 – 2015-06-29 (×16): 80 mg via ORAL
  Filled 2015-06-21 (×20): qty 1

## 2015-06-21 MED ORDER — POTASSIUM CHLORIDE 20 MEQ/15ML (10%) PO SOLN
40.0000 meq | Freq: Two times a day (BID) | ORAL | Status: DC
  Administered 2015-06-21: 40 meq via ORAL
  Filled 2015-06-21 (×13): qty 30

## 2015-06-21 MED ORDER — TRACE MINERALS CR-CU-MN-SE-ZN 10-1000-500-60 MCG/ML IV SOLN
INTRAVENOUS | Status: AC
  Administered 2015-06-21: 17:00:00 via INTRAVENOUS
  Filled 2015-06-21: qty 1992

## 2015-06-21 MED ORDER — LITHIUM CARBONATE 300 MG PO CAPS
300.0000 mg | ORAL_CAPSULE | Freq: Two times a day (BID) | ORAL | Status: DC
Start: 2015-06-21 — End: 2015-06-29
  Administered 2015-06-21 – 2015-06-29 (×17): 300 mg via ORAL
  Filled 2015-06-21 (×20): qty 1

## 2015-06-21 MED ORDER — FAT EMULSION 20 % IV EMUL
240.0000 mL | INTRAVENOUS | Status: AC
  Administered 2015-06-21: 240 mL via INTRAVENOUS
  Filled 2015-06-21: qty 250

## 2015-06-21 MED ORDER — ALUM & MAG HYDROXIDE-SIMETH 200-200-20 MG/5ML PO SUSP
30.0000 mL | ORAL | Status: DC | PRN
  Administered 2015-06-23: 30 mL via ORAL
  Filled 2015-06-21: qty 30

## 2015-06-21 MED ORDER — POTASSIUM CHLORIDE CRYS ER 20 MEQ PO TBCR
40.0000 meq | EXTENDED_RELEASE_TABLET | Freq: Two times a day (BID) | ORAL | Status: DC
  Administered 2015-06-21: 20 meq via ORAL
  Filled 2015-06-21: qty 2

## 2015-06-21 MED ORDER — HYDROXYZINE HCL 25 MG PO TABS
50.0000 mg | ORAL_TABLET | Freq: Every day | ORAL | Status: DC
  Administered 2015-06-21 – 2015-06-28 (×8): 50 mg via ORAL
  Filled 2015-06-21 (×8): qty 2

## 2015-06-21 MED ORDER — KETOROLAC TROMETHAMINE 30 MG/ML IJ SOLN
30.0000 mg | Freq: Once | INTRAMUSCULAR | Status: AC
  Administered 2015-06-21: 30 mg via INTRAVENOUS
  Filled 2015-06-21: qty 1

## 2015-06-21 NOTE — Progress Notes (Signed)
Referring Physician(s):  CCS  Chief Complaint:  Post surgical Gastric ulcer leak Peritoneal abscess drain placed 12/12  Subjective:  Feeling better Up in room Output thick and brown 60 cc in bag now  Allergies: Erythromycin and Sulfa antibiotics  Medications: Prior to Admission medications   Medication Sig Start Date End Date Taking? Authorizing Provider  albuterol (PROVENTIL HFA;VENTOLIN HFA) 108 (90 BASE) MCG/ACT inhaler Inhale 2 puffs into the lungs every 6 (six) hours as needed. Wheezing   Yes Historical Provider, MD  albuterol (PROVENTIL) (2.5 MG/3ML) 0.083% nebulizer solution Take 2.5 mg by nebulization every 6 (six) hours as needed for wheezing.   Yes Historical Provider, MD  budesonide-formoterol (SYMBICORT) 80-4.5 MCG/ACT inhaler Inhale 2 puffs into the lungs 2 (two) times daily.   Yes Historical Provider, MD  dicyclomine (BENTYL) 20 MG tablet Take 20 mg by mouth 4 (four) times daily.   Yes Historical Provider, MD  gabapentin (NEURONTIN) 100 MG capsule Take 100 mg by mouth 2 (two) times daily.   Yes Historical Provider, MD  hydrOXYzine (ATARAX/VISTARIL) 50 MG tablet Take 50 mg by mouth at bedtime.   Yes Historical Provider, MD  levofloxacin (LEVAQUIN) 750 MG tablet Take 1 tablet (750 mg total) by mouth daily. 06/15/15  Yes Freeman Caldron, PA-C  lithium carbonate 300 MG capsule Take 300 mg by mouth 2 (two) times daily.   Yes Historical Provider, MD  methocarbamol (ROBAXIN) 500 MG tablet Take 2 tablets (1,000 mg total) by mouth every 8 (eight) hours as needed for muscle spasms (or pain). 06/15/15  Yes Freeman Caldron, PA-C  oxyCODONE-acetaminophen (PERCOCET) 7.5-325 MG tablet Take 1-2 tablets by mouth every 4 (four) hours as needed. 06/15/15  Yes Freeman Caldron, PA-C  pantoprazole (PROTONIX) 40 MG tablet Take 1 tablet (40 mg total) by mouth 2 (two) times daily before a meal. 06/14/15  Yes Nonie Hoyer, PA-C  promethazine (PHENERGAN) 25 MG tablet Take 25 mg by  mouth every 6 (six) hours as needed for nausea or vomiting.   Yes Historical Provider, MD  SUMAtriptan (IMITREX) 100 MG tablet Take 100 mg by mouth every 2 (two) hours as needed. migraines 12/20/14  Yes Historical Provider, MD  ziprasidone (GEODON) 80 MG capsule Take 80 mg by mouth 2 (two) times daily with a meal.   Yes Historical Provider, MD  famotidine (PEPCID) 10 MG tablet Take 1 tablet (10 mg total) by mouth 2 (two) times daily as needed for heartburn or indigestion. Patient not taking: Reported on 06/17/2015 06/14/15   Nonie Hoyer, PA-C     Vital Signs: BP 133/77 mmHg  Pulse 95  Temp(Src) 98.9 F (37.2 C) (Oral)  Resp 18  Ht  (1.727 m)  Wt 259 lb 7.7 oz (117.7 kg)  BMI 39.46 kg/m2  SpO2 94%  LMP 04/04/2015  Physical Exam  Abdominal: Soft.  abd drain  Intact Site clean and dry Sl tender No redness No sign of infection No bleeding 40 cc yesterday 60 cc in bag now Cx no growth Wbc 12.6  Skin: Skin is warm.  Nursing note and vitals reviewed.   Imaging: Dg Chest 2 View  06/18/2015  CLINICAL DATA:  44 year old female with history of cough and fever since yesterday evening. EXAM: CHEST  2 VIEW COMPARISON:  06/17/2015. FINDINGS: A nasogastric tube is seen extending into the stomach, however, the tip of the nasogastric tube extends below the lower margin of the image. Persistent airspace consolidation in the right lower lobe. Moderate  right and small left pleural effusions. No evidence of pulmonary edema. Mild cardiomegaly. Upper mediastinal contours are within normal limits. IMPRESSION: 1. Nasogastric tube extending into the stomach. 2. Persistent right lower lobe airspace consolidation concerning for pneumonia with moderate right parapneumonic pleural effusion. Small left pleural effusion. Electronically Signed   By: Trudie Reedaniel  Entrikin M.D.   On: 06/18/2015 07:37   Ct Image Guided Drainage By Percutaneous Catheter  06/17/2015  CLINICAL DATA:  Gastric perforation, with  postop abdominal fluid collections. Largest peripherally enhancing collection in the left lower quadrant anteriorly adjacent to the body wall. EXAM: CT GUIDED PERITONEAL ABSCESS DRAIN CATHETER PLACEMENT ANESTHESIA/SEDATION: Intravenous Fentanyl and Versed were administered as conscious sedation during continuous cardiorespiratory monitoring by the radiology RN, with a total moderate sedation time of 10 minutes. PROCEDURE: The procedure risks, benefits, and alternatives were explained to the patient. Questions regarding the procedure were encouraged and answered. The patient understands and consents to the procedure. select axial scans obtained through the mid abdomen. The dominant, peripherally enhancing left lower quadrant collection was localized and an appropriate skin entry site was determined and marked. The operative field was prepped with chlorhexidinein a sterile fashion, and a sterile drape was applied covering the operative field. A sterile gown and sterile gloves were used for the procedure. Local anesthesia was provided with 1% Lidocaine. Under CT fluoroscopic guidance, a 19 gauge percutaneous entry needle advanced into the collection. Purulent material could be aspirated. A guidewire advanced easily within the collection, position confirmed on CT. Tract dilated to facilitate placement of a 12 French pigtail catheter, formed centrally within the collection. 20 mL of purulent aspirate were removed and sent for Gram stain, culture and sensitivity. The catheter was secured externally with 0 Prolene suture and StatLock and placed to gravity drain bag. The patient tolerated the procedure well. COMPLICATIONS: None immediate FINDINGS: CT confirms the 9.4 cm intraperitoneal left lower quadrant collection adjacent to the anterior body wall. 12French pigtail drain catheter placed as above. Sample sent for Gram stain, culture and sensitivity. IMPRESSION: Technically successful CT-guided peritoneal abscess drain  catheter placement. Electronically Signed   By: Corlis Leak  Hassell M.D.   On: 06/17/2015 16:58    Labs:  CBC:  Recent Labs  06/18/15 0455 06/19/15 0440 06/20/15 0513 06/21/15 0443  WBC 15.6* 13.1* 13.7* 12.6*  HGB 8.2* 7.6* 7.5* 8.4*  HCT 23.7* 22.7* 22.4* 24.8*  PLT 748* 752* 749* 709*    COAGS:  Recent Labs  06/17/15 1148  INR 1.50*  APTT 41*    BMP:  Recent Labs  06/18/15 0455 06/19/15 0440 06/20/15 0513 06/21/15 0443  NA 142 140 139 134*  K 3.3* 3.3* 3.4* 3.6  CL 106 108 107 105  CO2 24 26 24  21*  GLUCOSE 90 113* 110* 114*  BUN <5* <5* <5* <5*  CALCIUM 8.0* 7.9* 7.9* 8.0*  CREATININE 0.82 0.75 0.67 0.60  GFRNONAA >60 >60 >60 >60  GFRAA >60 >60 >60 >60    LIVER FUNCTION TESTS:  Recent Labs  06/04/15 0254 06/17/15 0428 06/18/15 0455 06/20/15 0513  BILITOT 0.3 0.5 0.6 0.5  AST 24 21 24  49*  ALT 14 13* 14 21  ALKPHOS 99 142* 138* 137*  PROT 7.0 5.8* 6.3* 5.9*  ALBUMIN 3.5 1.7* 1.8* 1.6*    Assessment and Plan:  Abd abscess drain placed 12/12 Output significant still Drain to remain Will follow  Signed: Desmen Schoffstall A 06/21/2015, 1:02 PM   I spent a total of 15 Minutes at the the patient's  bedside AND on the patient's hospital floor or unit, greater than 50% of which was counseling/coordinating care for abd abscess drain

## 2015-06-21 NOTE — Progress Notes (Signed)
Patient ID: Aylana Hirschfeld, female   DOB: Jul 21, 1970, 44 y.o.   MRN: [id number]    Subjective: Pt still with a depressed affect, but otherwise seems to have no new complaints.  Tolerated clear liquids well yesterday.  Still moving her bowels.  Objective: Vital signs in last 24 hours: Temp:  [98.4 F (36.9 C)-98.9 F (37.2 C)] 98.9 F (37.2 C) (12/16 0612) Pulse Rate:  [84-95] 95 (12/16 0612) Resp:  [18] 18 (12/16 0612) BP: (125-133)/(75-82) 133/77 mmHg (12/16 0612) SpO2:  [94 %-100 %] 94 % (12/16 0612) Last BM Date: 06/20/15  Intake/Output from previous day: 12/15 0701 - 12/16 0700 In: 2880 [P.O.:690; IV Piggyback:1175; TPN:1010] Out: 2040 [Urine:2000; Drains:40] Intake/Output this shift:    PE: Abd: soft, appropriately tender, VAC in place, drain in place with tan purulent drainage still 40cc/24 hrs, +BS Heart: regular Lungs: CTAB  Lab Results:   Recent Labs  06/20/15 0513 06/21/15 0443  WBC 13.7* 12.6*  HGB 7.5* 8.4*  HCT 22.4* 24.8*  PLT 749* 709*   BMET  Recent Labs  06/20/15 0513 06/21/15 0443  NA 139 134*  K 3.4* 3.6  CL 107 105  CO2 24 21*  GLUCOSE 110* 114*  BUN <5* <5*  CREATININE 0.67 0.60  CALCIUM 7.9* 8.0*   PT/INR No results for input(s): LABPROT, INR in the last 72 hours. CMP     Component Value Date/Time   NA 134* 06/21/2015 0443   K 3.6 06/21/2015 0443   CL 105 06/21/2015 0443   CO2 21* 06/21/2015 0443   GLUCOSE 114* 06/21/2015 0443   BUN <5* 06/21/2015 0443   CREATININE 0.60 06/21/2015 0443   CALCIUM 8.0* 06/21/2015 0443   PROT 5.9* 06/20/2015 0513   ALBUMIN 1.6* 06/20/2015 0513   AST 49* 06/20/2015 0513   ALT 21 06/20/2015 0513   ALKPHOS 137* 06/20/2015 0513   BILITOT 0.5 06/20/2015 0513   GFRNONAA >60 06/21/2015 0443   GFRAA >60 06/21/2015 0443   Lipase     Component Value Date/Time   LIPASE 36 06/17/2015 0428       Studies/Results: No results found.  Anti-infectives: Anti-infectives    Start     Dose/Rate  Route Frequency Ordered Stop   06/17/15 1700  vancomycin (VANCOCIN) IVPB 1000 mg/200 mL premix     1,000 mg 200 mL/hr over 60 Minutes Intravenous Every 8 hours 06/17/15 0829     06/17/15 1700  piperacillin-tazobactam (ZOSYN) IVPB 3.375 g  Status:  Discontinued     3.375 g 12.5 mL/hr over 240 Minutes Intravenous 3 times per day 06/17/15 0832 06/17/15 1225   06/17/15 1300  meropenem (MERREM) 1 g in sodium chloride 0.9 % 100 mL IVPB     1 g 200 mL/hr over 30 Minutes Intravenous Every 8 hours 06/17/15 1227     06/17/15 0845  vancomycin (VANCOCIN) 2,000 mg in sodium chloride 0.9 % 500 mL IVPB     2,000 mg 250 mL/hr over 120 Minutes Intravenous  Once 06/17/15 0829 06/17/15 1205   06/17/15 0845  piperacillin-tazobactam (ZOSYN) IVPB 3.375 g     3.375 g 100 mL/hr over 30 Minutes Intravenous  Once 06/17/15 0832 06/17/15 0947   06/17/15 0830  piperacillin-tazobactam (ZOSYN) IVPB 3.375 g  Status:  Discontinued     3.375 g 12.5 mL/hr over 240 Minutes Intravenous 3 times per day 06/17/15 0829 06/17/15 7829       Assessment/Plan  Perforated lesser curve gastric ulcer with purulent contamination of the abdominal cavity POD #17  s/p Ex Lap with omental patch repair, drain placement Intraabdominal fluid collections concerning for abscess -Ir drain in place with purulent drainage, cont for now - IVF, pain control, antiemetics (Vanc/Meropenem - for intraabdominal infection and ?HCAP Day #5) However, she does have some BS and is passing flatus.  -advance to full liquids -Ambulate and IS  -repeat CT when output from drain decreased PCM - cont TPN, but as she tolerates her diet we can start to wean this Bipolar -transition back to oral meds -Appreciate psych consult -This had a large effect on her compliance with medical care during her last admission Hypokalemia -This was a chronic issue on last admission, pharmacy following, mg normal -replace again today Anemia of chronic disease -To follow  up as an outpatient LE Edema Pleural Effusion/HCAP? -cont Vanc/Meropenem for now DVT proph - SCD's and lovenox (60mg )   LOS: 3 days    Taige Housman E 06/21/2015, 10:10 AM Pager: [phone]

## 2015-06-21 NOTE — Progress Notes (Signed)
Nutrition Follow-up  DOCUMENTATION CODES:   Obesity unspecified  INTERVENTION:   -TPN management per pharmacy -RD will continue to follow for diet advancement  NUTRITION DIAGNOSIS:   Inadequate oral intake related to wound healing as evidenced by estimated needs.  Ongoing  GOAL:   Patient will meet greater than or equal to 90% of their needs  Progressing  MONITOR:   Diet advancement, Labs, Weight trends, Skin, I & O's  REASON FOR ASSESSMENT:   Consult New TPN/TNA  ASSESSMENT:   Anita Villarreal is a 44 y.o. female with a Past Medical History of bipolar1, GERD, tobacco use, gastric ulcers and perforations status post laparotomy who presents with persistent worsening right pleural effusion and numerous abdominal seromas/abscesses and persistent gastric perforation  NGT was clamped for clamping trials on on 06/19/15 (100 ml output noted with gastric residuals). NGT removed on 06/20/15.   Pt was advanced to a clear liquid diet with NGT removal. Spoke with pt, who reports her appetite is poor and not eating much. She was consuming gingerale at time of visit. Noted orders to advance to a full liquid diet for lunch.   Pt continues to receive TPN; currently receiving Clinimix E 5/15 to 60 mL/hr and IVFE at 5310mL/hr (currently providing 72% Kcal and 63% protein needs). Per pharmacy note, plan to increase Clinimix E 5/15 to 83 mL/hr + lipids at 10 mL/hr - provides 100g protein (87% goal) and 1894 kcal/day (90% goal) at 1800. Pharmacist advancing slowly due to risk of refeeding.   Nutrition-Focused physical exam completed. Findings are no fat depletion, no muscle depletion, and moderate edema.   Labs reviewed: Mg, K, and Phos WDL.  Diet Order:  TPN (CLINIMIX-E) Adult .TPN (CLINIMIX-E) Adult Diet full liquid Room service appropriate?: Yes; Fluid consistency:: Thin  Skin:  Wound (see comment) (abdominal vac)  Last BM:  06/19/15  Height:   Ht Readings from Last 1 Encounters:   06/17/15 5\' 8"  (1.727 m)    Weight:   Wt Readings from Last 1 Encounters:  06/17/15 259 lb 7.7 oz (117.7 kg)    Ideal Body Weight:  63.6 kg  BMI:  Body mass index is 39.46 kg/(m^2).  Estimated Nutritional Needs:   Kcal:  2100-2300  Protein:  115-130 grams  Fluid:  >2.1 L  EDUCATION NEEDS:   No education needs identified at this time  Jacarie Pate A. Mayford KnifeWilliams, RD, LDN, CDE Pager: [phone] After hours Pager: [phone]

## 2015-06-21 NOTE — Progress Notes (Signed)
PARENTERAL NUTRITION CONSULT NOTE -Follow-up  Pharmacy Consult for TPN Indication: Gastric perforation  Allergies  Allergen Reactions  . Erythromycin Other (See Comments)    Tongue swelling   . Sulfa Antibiotics     swelling    Patient Measurements: Height: _0  (172.7 cm) Weight: 259 lb 7.7 oz (117.7 kg) IBW/kg (Calculated) : 63.9 Adjusted Body Weight: 77 kg BMI: 39  Vital Signs: Temp: 98.9 F (37.2 C) (12/16 0612) Temp Source: Oral (12/16 0612) BP: 133/77 mmHg (12/16 0612) Pulse Rate: 95 (12/16 0612)  Labs:  Recent Labs  06/19/15 0440 06/20/15 0513 06/21/15 0443  WBC 13.1* 13.7* 12.6*  HGB 7.6* 7.5* 8.4*  HCT 22.7* 22.4* 24.8*  PLT 752* 749* 709*     Recent Labs  06/19/15 0440 06/20/15 0513 06/21/15 0443  NA 140 139 134*  K 3.3* 3.4* 3.6  CL 108 107 105  CO2 26 24 21*  GLUCOSE 113* 110* 114*  BUN <5* <5* <5*  CREATININE 0.75 0.67 0.60  CALCIUM 7.9* 7.9* 8.0*  MG 2.0 1.8 1.9  PHOS 3.0 3.3 3.1  PROT  --  5.9*  --   ALBUMIN  --  1.6*  --   AST  --  49*  --   ALT  --  21  --   ALKPHOS  --  137*  --   BILITOT  --  0.5  --    Estimated Creatinine Clearance: 118.5 mL/min (by C-G formula based on Cr of 0.6).    Recent Labs  06/20/15 1726 06/20/15 2345 06/21/15 0611  GLUCAP 98 99 106*    Insulin Requirements in the past 24 hours:  0 units SSI  Assessment: 44 year old female with recent history gastric perforation s/p exlap and patch repair of gastric ulcer perforation on 11/29. Patient was discharged on 12/10 with soft oral diet. Now re-admitted 12/12 with progressive worsening of abdominal pain, chills, nausea, anorexia, and loose stools and CT shows defect of the lesser curve of the stomach with air bubbles concerning for perforated gastric ulcer, abscess or post-operative seroma.   Surgeries/Procedures: 12/12: Perc abd drain in IR - 58m purulent fluid aspirated from abscess  GI: GERD, gastric ulcers, perf -abd drain output decreased; NG  tube clamped-tolerating well. Albumin down 1.6. Prealbumin low at 7.9. LBM 06/20/15. BS and Flatus. Healing well and possible plan to d/c wound vac on discharge- timing still unclear.  Endo: CBGs controlled without need for insulin. SSI ordered but not given.  Lytes: Lytes WNL except low sodium of 134 Renal: SCr 0.6, off IVF, I/O: 2880/2040; good UOP 0.7 cc/kg/hr recorded last 24 hrs.  Pulm: R-pleural effusion- no plan for thoracentesis at this time, doing well on RA.  Cards: BP 133/77, HR 96. 12/13 ECHO- EF 55-60%.  Hepatobil: Mild bump in AST and ALT yesterday, Alk phos trending back down,Tbili wnl. TG 99 on 12/13. Neuro: Bipolar disorder- depressed affect. ID: HCAP +/- intraabdominal infection (abx d4) on vancomycin and merrem. WBC 12.6, afebrile, LA wnl.   12/12 Blood >> ngtd 12/12 Abscess cx >> ngtd  12/12 Vanc >>       VT 12/14 = 15 on 1g IV q8h -at goal at Css, drawn correctly.  12/12 Merrem >>  Best Practices: Lovenox 40 >> Low for BMI and now POD #16, wound vac still in place - consider adjustment, will check with surgery.    TPN Access: PICC placement 12/13 TPN start date: 12/13 >> present  Current Nutrition:  Clinimix E5/15 @  60 cc/hr + lipids 20% at 61m/hr Clear liquid diet  Nutritional Goals:  Per RD recommendations on 06/18/15:  2100-2300 KCal/day 115-130 g protein/day Fluid >2.1L  Plan:  - Increase Clinimix E 5/15 to 83 mL/hr + lipids at 10 mL/hr - provides 100g protein (87% goal) and 1894 kcal/day (90% goal) - advancing slowly d/t risk of refeeding - Daily multivitamin and trace elements - Continue sensitive SSI q6h - will likely be able to DC when TPN is at goal - F/u AM BMET, Mg, Phos - F/u advancement of diet  RSalome Arnt PharmD, BCPS Pager # [phone]/16/2016 8:07 AM

## 2015-06-21 NOTE — Progress Notes (Signed)
PROGRESS NOTE  Bernadene BellMelanie Peri ZOX:[id number] DOB: 1970-08-27 DOA: 06/17/2015 PCP: No primary care provider on file.  HPI/Recap of past 24 hours:  Patient is a 44 year old female past medical history of bipolar and major depressive disorder as well as recent perforated gastric ulcer status post exploratory laparotomy and closure with patch drain placement by general surgery admitted by general surgery on 12/12 with progressively worsening abdominal pain and CT findings consistent with perforation of ulcer. Prior to admission, patient was on by mouth antibiotics for pneumonia and on exam found to have pleural effusion. Hospitalist consulted.  Psychiatry consulted for bipolar disorder. Recommended for when necessary Geodon while nothing by mouth. Patient currently on bowel rest plus NG tube plus TPN and antibiotics with pus drained by interventional radiology on 12/12.  Pleural effusion not felt to be parapneumonic and could be sympathetic from her GI issue with secondary compressive atelectasis. Stable with no signs of acute pleuritic chest pain or respiratory failure. The patient about the same, complains of generalized pain all over, but nothing local to pleural effusion  Assessment/Plan: Principal Problem:   Bipolar I disorder, most recent episode mixed [facility](HCC): Being followed by psychiatry. By starting oral Geodon scheduled, Active Problems:   Acute gastric perforation (HCC) colon secondary to noncompliance of diet following recent surgical correction. On bowel rest plus TPN plus antibiotics status post surgical drain, still significant output   Anemia   Hypokalemia   HCAP (healthcare-associated pneumonia) with secondary atelectasis plus effusion. Continued on broad-spectrum antibiotics which will cover both GI and pulmonary at this point. No evidence of pleuritic chest pain or shortness of breath or respiratory failure requiring pulmonary consult. White blood cell count slowly  improving    Code Status: Full code   Family Communication: No family present   Disposition Plan: Management as per surgery    Consultants:  Psychiatry  Hospitalists  Interventional radiology   Procedures:  Status post draining of abdominal abscess at site of perforated ulcer   Antibiotics:  Vancomycin 12/12-present  Meropenem 12/12-present    Objective: BP 135/63 mmHg  Pulse 84  Temp(Src) 98.5 F (36.9 C) (Oral)  Resp 18  Ht 5\' 8"  (1.727 m)  Wt 117.7 kg (259 lb 7.7 oz)  BMI 39.46 kg/m2  SpO2 96%  LMP 04/04/2015  Intake/Output Summary (Last 24 hours) at 06/21/15 1626 Last data filed at 06/21/15 1031  Gross per 24 hour  Intake 8840.67 ml  Output   1800 ml  Net 7040.67 ml   Filed Weights   06/17/15 0345 06/17/15 1730  Weight: 115.667 kg (255 lb) 117.7 kg (259 lb 7.7 oz)    Exam: Little change from previous day  General:  Alert and oriented 2, flattened affect   Cardiovascular: Regular rate and rhythm, S1 and S2   Respiratory: Clear to auscultation bilaterally   Abdomen: Soft, nondistended, in place  Musculoskeletal: Trace pitting edema    Data Reviewed: Basic Metabolic Panel:  Recent Labs Lab 06/17/15 0428 06/17/15 1215 06/18/15 0455 06/19/15 0440 06/20/15 0513 06/21/15 0443  NA 138  --  142 140 139 134*  K 2.9*  --  3.3* 3.3* 3.4* 3.6  CL 106  --  106 108 107 105  CO2 24  --  24 26 24  21*  GLUCOSE 106*  --  90 113* 110* 114*  BUN <5*  --  <5* <5* <5* <5*  CREATININE 0.73  --  0.82 0.75 0.67 0.60  CALCIUM 7.9*  --  8.0* 7.9* 7.9* 8.0*  MG  --  1.7 2.1 2.0 1.8 1.9  PHOS  --  3.7 3.5 3.0 3.3 3.1   Liver Function Tests:  Recent Labs Lab 06/17/15 0428 06/18/15 0455 06/20/15 0513  AST 21 24 49*  ALT 13* 14 21  ALKPHOS 142* 138* 137*  BILITOT 0.5 0.6 0.5  PROT 5.8* 6.3* 5.9*  ALBUMIN 1.7* 1.8* 1.6*    Recent Labs Lab 06/17/15 0428  LIPASE 36   No results for input(s): AMMONIA in the last 168 hours. CBC:  Recent  Labs Lab 06/17/15 0428 06/18/15 0455 06/19/15 0440 06/20/15 0513 06/21/15 0443  WBC 15.0* 15.6* 13.1* 13.7* 12.6*  NEUTROABS  --  10.0*  --   --   --   HGB 7.7* 8.2* 7.6* 7.5* 8.4*  HCT 22.7* 23.7* 22.7* 22.4* 24.8*  MCV 83.8 85.3 85.3 85.2 86.7  PLT 597* 748* 752* 749* 709*   Cardiac Enzymes:   No results for input(s): CKTOTAL, CKMB, CKMBINDEX, TROPONINI in the last 168 hours. BNP (last 3 results)  Recent Labs  06/17/15 0428  BNP 56.5    ProBNP (last 3 results) No results for input(s): PROBNP in the last 8760 hours.  CBG:  Recent Labs Lab 06/20/15 1115 06/20/15 1726 06/20/15 2345 06/21/15 0611 06/21/15 1237  GLUCAP 115* 98 99 106* 103*    Recent Results (from the past 240 hour(s))  Culture, blood (routine x 2)     Status: None (Preliminary result)   Collection Time: 06/17/15  8:38 AM  Result Value Ref Range Status   Specimen Description BLOOD LEFT FOREARM  Final   Special Requests BOTTLES DRAWN AEROBIC AND ANAEROBIC  Final   Culture NO GROWTH 4 DAYS  Final   Report Status PENDING  Incomplete  Culture, blood (routine x 2)     Status: None (Preliminary result)   Collection Time: 06/17/15  8:40 AM  Result Value Ref Range Status   Specimen Description BLOOD RIGHT ANTECUBITAL  Final   Special Requests BOTTLES DRAWN AEROBIC AND ANAEROBIC  Final   Culture NO GROWTH 4 DAYS  Final   Report Status PENDING  Incomplete  Culture, routine-abscess     Status: None   Collection Time: 06/17/15  4:38 PM  Result Value Ref Range Status   Specimen Description ABSCESS  Final   Special Requests LLQ CT DRAIN  Final   Gram Stain   Final    ABUNDANT WBC PRESENT, PREDOMINANTLY PMN NO SQUAMOUS EPITHELIAL CELLS SEEN NO ORGANISMS SEEN Performed at [facility]    Culture   Final    NO GROWTH 3 DAYS Performed at [facility]    Report Status 06/21/2015 FINAL  Final     Studies: No results found.  Scheduled Meds: . antiseptic oral rinse  7 mL  Mouth Rinse q12n4p  . budesonide-formoterol  2 puff Inhalation BID  . chlorhexidine  15 mL Mouth Rinse BID  . enoxaparin (LOVENOX) injection  0.5 mg/kg Subcutaneous Daily  . hydrOXYzine  50 mg Oral QHS  . insulin aspart  0-9 Units Subcutaneous 4 times per day  . lithium carbonate  300 mg Oral BID  . meropenem (MERREM) IV  1 g Intravenous Q8H  . potassium chloride  40 mEq Oral BID  . vancomycin  1,000 mg Intravenous Q8H  . ziprasidone  80 mg Oral BID WC    Continuous Infusions: . Marland KitchenTPN (CLINIMIX-E) Adult     And  . fat emulsion    . Marland KitchenTPN (CLINIMIX-E) Adult 60  mL/hr at 06/20/15 1847   And  . fat emulsion 240 mL (06/20/15 1847)     Time spent: 15 minutes   Hollice Espy  [facility] Pager [phone] . If 7PM-7AM, please contact night-coverage at [email or web address], password [facility] 06/21/2015, 4:26 PM  LOS: 3 days

## 2015-06-22 LAB — CULTURE, BLOOD (ROUTINE X 2)
Culture: NO GROWTH
Culture: NO GROWTH

## 2015-06-22 LAB — BASIC METABOLIC PANEL
Anion gap: 9 (ref 5–15)
BUN: 6 mg/dL (ref 6–20)
CALCIUM: 8.3 mg/dL — AB (ref 8.9–10.3)
CO2: 23 mmol/L (ref 22–32)
Chloride: 105 mmol/L (ref 101–111)
Creatinine, Ser: 0.64 mg/dL (ref 0.44–1.00)
GFR calc Af Amer: >60 mL/min (ref >60)
GLUCOSE: 113 mg/dL — AB (ref 65–99)
Potassium: 4 mmol/L (ref 3.5–5.1)
SODIUM: 137 mmol/L (ref 135–145)

## 2015-06-22 LAB — PHOSPHORUS: Phosphorus: 4.1 mg/dL (ref 2.5–4.6)

## 2015-06-22 LAB — GLUCOSE, CAPILLARY
GLUCOSE-CAPILLARY: 107 mg/dL — AB (ref 65–99)
Glucose-Capillary: 101 mg/dL — ABNORMAL HIGH (ref 65–99)
Glucose-Capillary: 103 mg/dL — ABNORMAL HIGH (ref 65–99)
Glucose-Capillary: 108 mg/dL — ABNORMAL HIGH (ref 65–99)
Glucose-Capillary: 125 mg/dL — ABNORMAL HIGH (ref 65–99)

## 2015-06-22 LAB — MAGNESIUM: MAGNESIUM: 1.9 mg/dL (ref 1.7–2.4)

## 2015-06-22 MED ORDER — WHITE PETROLATUM GEL
Status: AC
  Administered 2015-06-22: 10:00:00
  Filled 2015-06-22: qty 1

## 2015-06-22 MED ORDER — TRACE MINERALS CR-CU-MN-SE-ZN 10-1000-500-60 MCG/ML IV SOLN
INTRAVENOUS | Status: AC
  Administered 2015-06-22: 18:00:00 via INTRAVENOUS
  Filled 2015-06-22: qty 2400

## 2015-06-22 MED ORDER — HYDROMORPHONE HCL 1 MG/ML IJ SOLN
0.5000 mg | Freq: Once | INTRAMUSCULAR | Status: AC
  Administered 2015-06-22: 0.5 mg via INTRAVENOUS

## 2015-06-22 MED ORDER — FAT EMULSION 20 % IV EMUL
240.0000 mL | INTRAVENOUS | Status: AC
  Administered 2015-06-22: 240 mL via INTRAVENOUS
  Filled 2015-06-22: qty 250

## 2015-06-22 MED ORDER — HYDROMORPHONE HCL 1 MG/ML IJ SOLN
1.0000 mg | INTRAMUSCULAR | Status: DC | PRN
  Administered 2015-06-22 – 2015-06-23 (×3): 1 mg via INTRAVENOUS
  Filled 2015-06-22 (×5): qty 1

## 2015-06-22 NOTE — Progress Notes (Signed)
Pharmacy Antibiotic Follow-up Note  Anita BellMelanie Villarreal is a 44 y.o. year-old female admitted on 06/17/2015.  The patient is currently on day 6 of vancomycin and meropenem for HCAP and intra-abdominal infection.  Assessment: 44 yof continues on broad-spectrum antibiotics. Pt is afebrile and WBC is trending down. Doses remain appropriate. Cultures remain negative.  12/12 Blood >> ngtd 12/12 Abscess cx >> NEG  12/12 Vanc >>   VT 12/14 = 15 on 1g IV q8h -at goal at Css, drawn correctly.  12/12 Merrem >>  Plan: - Continue vancomycin 1gm IV Q8H - Continue meropenem 1gm IV Q8H - F/u renal fxn, C&S, clinical status and recheck trough PRN - MD - Please address planned LOT  Temp (24hrs), Avg:98.4 F (36.9 C), Min:98.3 F (36.8 C), Max:98.5 F (36.9 C)   Recent Labs Lab 06/17/15 0428 06/18/15 0455 06/19/15 0440 06/20/15 0513 06/21/15 0443  WBC 15.0* 15.6* 13.1* 13.7* 12.6*    Recent Labs Lab 06/18/15 0455 06/19/15 0440 06/20/15 0513 06/21/15 0443 06/22/15 0521  CREATININE 0.82 0.75 0.67 0.60 0.64   Estimated Creatinine Clearance: 118.5 mL/min (by C-G formula based on Cr of 0.64).    Thank you for allowing pharmacy to be a part of this patient's care.  Marcey Persad, Drake Leachachel Lynn PharmD 06/22/2015 8:59 AM

## 2015-06-22 NOTE — Progress Notes (Signed)
Patient ID: Anita Villarreal, female   DOB: 11-14-1970, 44 y.o.   MRN: [id number] [facility] Progress Note:   * No surgery found *  Subjective: Mental status is awake with flat affect Objective: Vital signs in last 24 hours: Temp:  [98.3 F (36.8 C)-98.5 F (36.9 C)] 98.4 F (36.9 C) (12/17 0535) Pulse Rate:  [84-109] 95 (12/17 0535) Resp:  [16-18] 16 (12/17 0535) BP: (129-135)/(63-84) 135/74 mmHg (12/17 0535) SpO2:  [95 %-100 %] 95 % (12/17 0811)  Intake/Output from previous day: 12/16 0701 - 12/17 0700 In: 9929.9 [I.V.:5597.5; IV Piggyback:2456; TPN:1876.4] Out: 4400 [Urine:4400] Intake/Output this shift:    Physical Exam: Work of breathing is not labored;  Drain bag on left leg is enteric colored clearer liquid with some floculation  Lab Results:  Results for orders placed or performed during the hospital encounter of 06/17/15 (from the past 48 hour(s))  Glucose, capillary     Status: Abnormal   Collection Time: 06/20/15 11:15 AM  Result Value Ref Range   Glucose-Capillary 115 (H) 65 - 99 mg/dL   Comment 1 Notify RN   Glucose, capillary     Status: None   Collection Time: 06/20/15  5:26 PM  Result Value Ref Range   Glucose-Capillary 98 65 - 99 mg/dL   Comment 1 Notify RN   Glucose, capillary     Status: None   Collection Time: 06/20/15 11:45 PM  Result Value Ref Range   Glucose-Capillary 99 65 - 99 mg/dL  CBC     Status: Abnormal   Collection Time: 06/21/15  4:43 AM  Result Value Ref Range   WBC 12.6 (H) 4.0 - 10.5 K/uL   RBC 2.86 (L) 3.87 - 5.11 MIL/uL   Hemoglobin 8.4 (L) 12.0 - 15.0 g/dL   HCT 24.8 (L) 36.0 - 46.0 %   MCV 86.7 78.0 - 100.0 fL   MCH 29.4 26.0 - 34.0 pg   MCHC 33.9 30.0 - 36.0 g/dL   RDW 17.3 (H) 11.5 - 15.5 %   Platelets 709 (H) 150 - 400 K/uL  Basic metabolic panel     Status: Abnormal   Collection Time: 06/21/15  4:43 AM  Result Value Ref Range   Sodium 134 (L) 135 - 145 mmol/L   Potassium 3.6 3.5 - 5.1 mmol/L   Chloride  105 101 - 111 mmol/L   CO2 21 (L) 22 - 32 mmol/L   Glucose, Bld 114 (H) 65 - 99 mg/dL   BUN <5 (L) 6 - 20 mg/dL   Creatinine, Ser 0.60 0.44 - 1.00 mg/dL   Calcium 8.0 (L) 8.9 - 10.3 mg/dL   GFR calc non Af Amer >60 >60 mL/min   GFR calc Af Amer >60 >60 mL/min    Comment: (NOTE) The eGFR has been calculated using the CKD EPI equation. This calculation has not been validated in all clinical situations. eGFR's persistently <60 mL/min signify possible Chronic Kidney Disease.    Anion gap 8 5 - 15  Magnesium     Status: None   Collection Time: 06/21/15  4:43 AM  Result Value Ref Range   Magnesium 1.9 1.7 - 2.4 mg/dL  Phosphorus     Status: None   Collection Time: 06/21/15  4:43 AM  Result Value Ref Range   Phosphorus 3.1 2.5 - 4.6 mg/dL  Glucose, capillary     Status: Abnormal   Collection Time: 06/21/15  6:11 AM  Result Value Ref Range   Glucose-Capillary 106 (H) 65 -  99 mg/dL  Glucose, capillary     Status: Abnormal   Collection Time: 06/21/15 12:37 PM  Result Value Ref Range   Glucose-Capillary 103 (H) 65 - 99 mg/dL  Glucose, capillary     Status: Abnormal   Collection Time: 06/21/15  6:39 PM  Result Value Ref Range   Glucose-Capillary 100 (H) 65 - 99 mg/dL  Glucose, capillary     Status: Abnormal   Collection Time: 06/22/15 12:01 AM  Result Value Ref Range   Glucose-Capillary 107 (H) 65 - 99 mg/dL  Basic metabolic panel     Status: Abnormal   Collection Time: 06/22/15  5:21 AM  Result Value Ref Range   Sodium 137 135 - 145 mmol/L   Potassium 4.0 3.5 - 5.1 mmol/L   Chloride 105 101 - 111 mmol/L   CO2 23 22 - 32 mmol/L   Glucose, Bld 113 (H) 65 - 99 mg/dL   BUN 6 6 - 20 mg/dL   Creatinine, Ser 0.64 0.44 - 1.00 mg/dL   Calcium 8.3 (L) 8.9 - 10.3 mg/dL   GFR calc non Af Amer >60 >60 mL/min   GFR calc Af Amer >60 >60 mL/min    Comment: (NOTE) The eGFR has been calculated using the CKD EPI equation. This calculation has not been validated in all clinical  situations. eGFR's persistently <60 mL/min signify possible Chronic Kidney Disease.    Anion gap 9 5 - 15  Magnesium     Status: None   Collection Time: 06/22/15  5:21 AM  Result Value Ref Range   Magnesium 1.9 1.7 - 2.4 mg/dL  Phosphorus     Status: None   Collection Time: 06/22/15  5:21 AM  Result Value Ref Range   Phosphorus 4.1 2.5 - 4.6 mg/dL  Glucose, capillary     Status: Abnormal   Collection Time: 06/22/15  6:22 AM  Result Value Ref Range   Glucose-Capillary 125 (H) 65 - 99 mg/dL    Radiology/Results: No results found.  Anti-infectives: Anti-infectives    Start     Dose/Rate Route Frequency Ordered Stop   06/17/15 1700  vancomycin (VANCOCIN) IVPB 1000 mg/200 mL premix     1,000 mg 200 mL/hr over 60 Minutes Intravenous Every 8 hours 06/17/15 0829     06/17/15 1700  piperacillin-tazobactam (ZOSYN) IVPB 3.375 g  Status:  Discontinued     3.375 g 12.5 mL/hr over 240 Minutes Intravenous 3 times per day 06/17/15 0832 06/17/15 1225   06/17/15 1300  meropenem (MERREM) 1 g in sodium chloride 0.9 % 100 mL IVPB     1 g 200 mL/hr over 30 Minutes Intravenous Every 8 hours 06/17/15 1227     06/17/15 0845  vancomycin (VANCOCIN) 2,000 mg in sodium chloride 0.9 % 500 mL IVPB     2,000 mg 250 mL/hr over 120 Minutes Intravenous  Once 06/17/15 0829 06/17/15 1205   06/17/15 0845  piperacillin-tazobactam (ZOSYN) IVPB 3.375 g     3.375 g 100 mL/hr over 30 Minutes Intravenous  Once 06/17/15 0832 06/17/15 0947   06/17/15 0830  piperacillin-tazobactam (ZOSYN) IVPB 3.375 g  Status:  Discontinued     3.375 g 12.5 mL/hr over 240 Minutes Intravenous 3 times per day 06/17/15 8099 06/17/15 8338      Assessment/Plan: Problem List: Patient Active Problem List   Diagnosis Date Noted  . Anemia 06/17/2015  . Hypokalemia 06/17/2015  . HCAP (healthcare-associated pneumonia) 06/17/2015  . Pneumonia 06/17/2015  . Intra-abdominal infection   . Abdominal  abscess ([place])   . Bipolar I disorder,  most recent episode mixed ([place]) 06/07/2015  . Acute gastric perforation ([place]) 06/04/2015    IR drain with some evidence of leakage.  Taking po and continue observation.   * No surgery found *    LOS: 4 days   Matt B. Hassell Done, MD, [facility] [phone] beeper [phone]  06/22/2015 9:04 AM

## 2015-06-22 NOTE — Progress Notes (Signed)
PARENTERAL NUTRITION CONSULT NOTE -Follow-up  Pharmacy Consult for TPN Indication: Gastric perforation  Allergies  Allergen Reactions  . Erythromycin Other (See Comments)    Tongue swelling   . Sulfa Antibiotics     swelling    Patient Measurements: Height: 5' 8"  (172.7 cm) Weight: 259 lb 7.7 oz (117.7 kg) IBW/kg (Calculated) : 63.9 Adjusted Body Weight: 77 kg BMI: 39  Vital Signs: Temp: 98.4 F (36.9 C) (12/17 0535) Temp Source: Oral (12/17 0535) BP: 135/74 mmHg (12/17 0535) Pulse Rate: 95 (12/17 0535)  Labs:  Recent Labs  06/20/15 0513 06/21/15 0443  WBC 13.7* 12.6*  HGB 7.5* 8.4*  HCT 22.4* 24.8*  PLT 749* 709*     Recent Labs  06/20/15 0513 06/21/15 0443 06/22/15 0521  NA 139 134* 137  K 3.4* 3.6 4.0  CL 107 105 105  CO2 24 21* 23  GLUCOSE 110* 114* 113*  BUN <5* <5* 6  CREATININE 0.67 0.60 0.64  CALCIUM 7.9* 8.0* 8.3*  MG 1.8 1.9 1.9  PHOS 3.3 3.1 4.1  PROT 5.9*  --   --   ALBUMIN 1.6*  --   --   AST 49*  --   --   ALT 21  --   --   ALKPHOS 137*  --   --   BILITOT 0.5  --   --    Estimated Creatinine Clearance: 118.5 mL/min (by C-G formula based on Cr of 0.64).    Recent Labs  06/21/15 1839 06/22/15 0001 06/22/15 0622  GLUCAP 100* 107* 125*   Insulin Requirements in the past 24 hours:  0 units SSI  Assessment: 44 year old female with recent history gastric perforation s/p exlap and patch repair of gastric ulcer perforation on 11/29. Patient was discharged on 12/10 with soft oral diet. Now re-admitted 12/12 with progressive worsening of abdominal pain, chills, nausea, anorexia, and loose stools and CT shows defect of the lesser curve of the stomach with air bubbles concerning for perforated gastric ulcer, abscess or post-operative seroma.   Surgeries/Procedures: 12/12: Perc abd drain in IR - 56m purulent fluid aspirated from abscess  GI: GERD, gastric ulcers, perf -abd drain output decreased; NG tube clamped-tolerating well. Albumin  down 1.6. Prealbumin low at 7.9. LBM 06/21/15. BS and Flatus. Healing well and possible plan to d/c wound vac on discharge- timing still unclear. Advancing oral diet slowly but no PO intake documented yesterday Endo: CBGs controlled without need for insulin. SSI ordered but not given.  Lytes:Evaristo BuryWNL  Renal: SCr 0.64, good UPO 1.6 Pulm: R-pleural effusion- no plan for thoracentesis at this time, doing well on RA.  Cards: BP 135/74, HR 95. 12/13 ECHO- EF 55-60%.  Hepatobil: Mild bump in AST and ALT yesterday, Alk phos trending back down,Tbili wnl. TG 99 on 12/13. Neuro: Bipolar disorder- depressed affect. ID: HCAP +/- intraabdominal infection (abx d4) on vancomycin and merrem. WBC 12.6, afebrile, LA wnl.   12/12 Blood >> ngtd 12/12 Abscess cx >> NEG  12/12 Vanc >>       VT 12/14 = 15 on 1g IV q8h -at goal at Css, drawn correctly.  12/12 Merrem >>  Best Practices: Lovenox    TPN Access: PICC placement 12/13 TPN start date: 12/13 >> present  Current Nutrition:  Clinimix E5/15 @ 83 cc/hr + lipids 20% at 160mhr Clear liquid diet  Nutritional Goals:  Per RD recommendations on 06/21/15:  2100-2300 KCal/day 115-130 g protein/day Fluid >2.1L  Plan:  - Increase Clinimix  E 5/15 to goal rate of 100 mL/hr + lipids at 10 mL/hr - provides 120g protein and 2184 kcal/day which is 100% goal - Daily multivitamin and trace elements - Continue SSI until TPN is at goal - F/u AM BMET - F/u advancement of diet  Salome Arnt, PharmD, BCPS Pager # [phone] 06/22/2015 8:45 AM

## 2015-06-23 LAB — GLUCOSE, CAPILLARY
GLUCOSE-CAPILLARY: 114 mg/dL — AB (ref 65–99)
Glucose-Capillary: 113 mg/dL — ABNORMAL HIGH (ref 65–99)
Glucose-Capillary: 129 mg/dL — ABNORMAL HIGH (ref 65–99)

## 2015-06-23 LAB — BASIC METABOLIC PANEL
Anion gap: 9 (ref 5–15)
BUN: 9 mg/dL (ref 6–20)
CO2: 23 mmol/L (ref 22–32)
CREATININE: 0.63 mg/dL (ref 0.44–1.00)
Calcium: 8.6 mg/dL — ABNORMAL LOW (ref 8.9–10.3)
Chloride: 104 mmol/L (ref 101–111)
GFR calc non Af Amer: >60 mL/min (ref >60)
Glucose, Bld: 111 mg/dL — ABNORMAL HIGH (ref 65–99)
Potassium: 4 mmol/L (ref 3.5–5.1)
SODIUM: 136 mmol/L (ref 135–145)

## 2015-06-23 MED ORDER — DIPHENHYDRAMINE HCL 25 MG PO CAPS
25.0000 mg | ORAL_CAPSULE | Freq: Four times a day (QID) | ORAL | Status: DC | PRN
  Administered 2015-06-23 – 2015-06-24 (×2): 25 mg via ORAL
  Filled 2015-06-23 (×2): qty 1

## 2015-06-23 MED ORDER — TRACE MINERALS CR-CU-MN-SE-ZN 10-1000-500-60 MCG/ML IV SOLN
INTRAVENOUS | Status: AC
  Administered 2015-06-23: 18:00:00 via INTRAVENOUS
  Filled 2015-06-23: qty 2400

## 2015-06-23 MED ORDER — HYDROMORPHONE HCL 1 MG/ML IJ SOLN
1.0000 mg | INTRAMUSCULAR | Status: DC | PRN
  Administered 2015-06-23 – 2015-06-26 (×24): 1 mg via INTRAVENOUS
  Filled 2015-06-23 (×24): qty 1

## 2015-06-23 MED ORDER — FAT EMULSION 20 % IV EMUL
240.0000 mL | INTRAVENOUS | Status: AC
  Administered 2015-06-23: 240 mL via INTRAVENOUS
  Filled 2015-06-23: qty 250

## 2015-06-23 NOTE — Progress Notes (Signed)
PROGRESS NOTE  Anita Villarreal ZOX:[id number] DOB: [birth year] DOA: 06/17/2015 PCP: No primary care provider on file.  HPI/Recap of past 24 hours:  Patient is a 44 year old female past medical history of bipolar and major depressive disorder as well as recent perforated gastric ulcer status post exploratory laparotomy and closure with patch drain placement by general surgery admitted by general surgery on 12/12 with progressively worsening abdominal pain and CT findings consistent with perforation of ulcer. Prior to admission, patient was on by mouth antibiotics for pneumonia and on exam found to have pleural effusion. Hospitalist consulted.  Psychiatry consulted for bipolar disorder. Recommended for when necessary Geodon while nothing by mouth. Patient currently on bowel rest plus NG tube plus TPN and antibiotics with pus drained by interventional radiology on 12/12.  Pleural effusion not felt to be parapneumonic and could be sympathetic from her GI issue with secondary compressive atelectasis. Stable with no signs of acute pleuritic chest pain or respiratory failure.   Patient states that her pain is not well controlled and she is hurting all over. She does however say this every time I ask her. Assessment/Plan: Principal Problem:   Bipolar I disorder, most recent episode mixed [facility]): Being followed by psychiatry. By starting oral Geodon scheduled, Active Problems:   Acute gastric perforation (HCC) colon secondary to noncompliance of diet following recent surgical correction. On bowel rest plus TPN plus antibiotics status post surgical drain, still significant output   Anemia   Hypokalemia   HCAP (healthcare-associated pneumonia) with secondary atelectasis plus effusion. Continued on broad-spectrum antibiotics which will cover GI. Note that today completes full course of antibiotics for pneumonia, so will discontinue vancomycin. No evidence of pleuritic chest pain or shortness of breath or  respiratory failure requiring pulmonary consult. White blood cell count slowly improving    Code Status: Full code   Family Communication: No family present   Disposition Plan: Management as per surgery    Consultants:  Psychiatry  Hospitalists  Interventional radiology   Procedures:  Status post draining of abdominal abscess at site of perforated ulcer   Antibiotics:  Vancomycin 12/12-12/18  Meropenem 12/12-present    Objective: BP 125/83 mmHg  Pulse 113  Temp(Src) 99.1 F (37.3 C) (Oral)  Resp 16  Ht  (1.727 m)  Wt 117.7 kg (259 lb 7.7 oz)  BMI 39.46 kg/m2  SpO2 98%  LMP 04/04/2015  Intake/Output Summary (Last 24 hours) at 06/23/15 1310 Last data filed at 06/23/15 0617  Gross per 24 hour  Intake 2180.95 ml  Output   3845 ml  Net -1664.05 ml   Filed Weights   06/17/15 0345 06/17/15 1730  Weight: 115.667 kg (255 lb) 117.7 kg (259 lb 7.7 oz)    Exam:   General:  Alert and oriented 2, flattened affect   Cardiovascular: Regular rate and rhythm, S1 and S2   Respiratory: Clear to auscultation bilaterally   Abdomen: Soft, nondistended, drain in place  Musculoskeletal: Trace pitting edema    Data Reviewed: Basic Metabolic Panel:  Recent Labs Lab 06/18/15 0455 06/19/15 0440 06/20/15 0513 06/21/15 0443 06/22/15 0521 06/23/15 0539  NA 142 140 139 134* 137 136  K 3.3* 3.3* 3.4* 3.6 4.0 4.0  CL 106 108 107 105 105 104  CO2 21* 23 23  GLUCOSE 90 113* 110* 114* 113* 111*  BUN <5* <5* <5* <5* 6 9  CREATININE 0.82 0.75 0.67 0.60 0.64 0.63  CALCIUM 8.0* 7.9* 7.9* 8.0* 8.3* 8.6*  MG 2.1  2.0 1.8 1.9 1.9  --   PHOS 3.5 3.0 3.3 3.1 4.1  --    Liver Function Tests:  Recent Labs Lab 06/17/15 0428 06/18/15 0455 06/20/15 0513  AST 21 24 49*  ALT 13* 14 21  ALKPHOS 142* 138* 137*  BILITOT 0.5 0.6 0.5  PROT 5.8* 6.3* 5.9*  ALBUMIN 1.7* 1.8* 1.6*    Recent Labs Lab 06/17/15 0428  LIPASE 36   No results for input(s):  AMMONIA in the last 168 hours. CBC:  Recent Labs Lab 06/17/15 0428 06/18/15 0455 06/19/15 0440 06/20/15 0513 06/21/15 0443  WBC 15.0* 15.6* 13.1* 13.7* 12.6*  NEUTROABS  --  10.0*  --   --   --   HGB 7.7* 8.2* 7.6* 7.5* 8.4*  HCT 22.7* 23.7* 22.7* 22.4* 24.8*  MCV 83.8 85.3 85.3 85.2 86.7  PLT 597* 748* 752* 749* 709*   Cardiac Enzymes:   No results for input(s): CKTOTAL, CKMB, CKMBINDEX, TROPONINI in the last 168 hours. BNP (last 3 results)  Recent Labs  06/17/15 0428  BNP 56.5    ProBNP (last 3 results) No results for input(s): PROBNP in the last 8760 hours.  CBG:  Recent Labs Lab 06/22/15 1245 06/22/15 1730 06/22/15 2350 06/23/15 0612 06/23/15 1226  GLUCAP 101* 103* 108* 113* 129*    Recent Results (from the past 240 hour(s))  Culture, blood (routine x 2)     Status: None   Collection Time: 06/17/15  8:38 AM  Result Value Ref Range Status   Specimen Description BLOOD LEFT FOREARM  Final   Special Requests BOTTLES DRAWN AEROBIC AND ANAEROBIC 10MLS  Final   Culture NO GROWTH 5 DAYS  Final   Report Status 06/22/2015 FINAL  Final  Culture, blood (routine x 2)     Status: None   Collection Time: 06/17/15  8:40 AM  Result Value Ref Range Status   Specimen Description BLOOD RIGHT ANTECUBITAL  Final   Special Requests BOTTLES DRAWN AEROBIC AND ANAEROBIC 10MLS  Final   Culture NO GROWTH 5 DAYS  Final   Report Status 06/22/2015 FINAL  Final  Culture, routine-abscess     Status: None   Collection Time: 06/17/15  4:38 PM  Result Value Ref Range Status   Specimen Description ABSCESS  Final   Special Requests LLQ CT DRAIN  Final   Gram Stain   Final    ABUNDANT WBC PRESENT, PREDOMINANTLY PMN NO SQUAMOUS EPITHELIAL CELLS SEEN NO ORGANISMS SEEN [facility]formed at [facility]    Culture   Final    NO GROWTH 3 DAYS Performed at [facility]    Report Status 06/21/2015 FINAL  Final     Studies: No results found.  Scheduled Meds: . antiseptic  oral rinse  7 mL Mouth Rinse q12n4p  . budesonide-formoterol  2 puff Inhalation BID  . chlorhexidine  15 mL Mouth Rinse BID  . enoxaparin (LOVENOX) injection  0.5 mg/kg Subcutaneous Daily  . hydrOXYzine  50 mg Oral QHS  . lithium carbonate  300 mg Oral BID  . meropenem (MERREM) IV  1 g Intravenous Q8H  . potassium chloride  40 mEq Oral BID  . vancomycin  1,000 mg Intravenous Q8H  . ziprasidone  80 mg Oral BID WC    Continuous Infusions: . Marland Kitchen.TPN (CLINIMIX-E) Adult 100 mL/hr at 06/22/15 1755   And  . fat emulsion 240 mL (06/22/15 1756)  . Marland Kitchen.TPN (CLINIMIX-E) Adult     And  . fat emulsion  Time spent: 15 minutes   Hollice Espy  [facility] Pager [phone] . If 7PM-7AM, please contact night-coverage at [email or web address], password [facility] 06/23/2015, 1:10 PM  LOS: 5 days

## 2015-06-23 NOTE — Progress Notes (Signed)
PARENTERAL NUTRITION CONSULT NOTE -Follow-up  Pharmacy Consult for TPN Indication: Gastric perforation  Allergies  Allergen Reactions  . Erythromycin Other (See Comments)    Tongue swelling   . Sulfa Antibiotics     swelling    Patient Measurements: Height: _0  (172.7 cm) Weight: 259 lb 7.7 oz (117.7 kg) IBW/kg (Calculated) : 63.9 Adjusted Body Weight: 77 kg BMI: 39  Vital Signs: Temp: 99.1 F (37.3 C) (12/18 0611) Temp Source: Oral (12/18 9798) BP: 125/83 mmHg (12/18 0611) Pulse Rate: 113 (12/18 0611)  Labs:  Recent Labs  06/21/15 0443  WBC 12.6*  HGB 8.4*  HCT 24.8*  PLT 709*     Recent Labs  06/21/15 0443 06/22/15 0521 06/23/15 0539  NA 134* 137 136  K 3.6 4.0 4.0  CL 105 105 104  CO2 21* 23 23  GLUCOSE 114* 113* 111*  BUN <5* 6 9  CREATININE 0.60 0.64 0.63  CALCIUM 8.0* 8.3* 8.6*  MG 1.9 1.9  --   PHOS 3.1 4.1  --    Estimated Creatinine Clearance: 118.5 mL/min (by C-G formula based on Cr of 0.63).    Recent Labs  06/22/15 1730 06/22/15 2350 06/23/15 0612  GLUCAP 103* 108* 113*   Insulin Requirements in the past 24 hours:  0 units SSI  Assessment: 44 year old female with recent history gastric perforation s/p exlap and patch repair of gastric ulcer perforation on 11/29. Patient was discharged on 12/10 with soft oral diet. Now re-admitted 12/12 with progressive worsening of abdominal pain, chills, nausea, anorexia, and loose stools and CT shows defect of the lesser curve of the stomach with air bubbles concerning for perforated gastric ulcer, abscess or post-operative seroma.   Surgeries/Procedures: 12/12: Perc abd drain in IR - 67m purulent fluid aspirated from abscess  GI: GERD, gastric ulcers, perf -abd drain output decreased; Albumin down 1.6. Prealbumin low at 7.9. LBM 06/21/15. BS and Flatus. Healing well and possible plan to d/c wound vac on discharge- timing still unclear. Advancing oral diet slowly and currently on a full liquid  diet. Endo: CBGs controlled without need for insulin. SSI ordered but not given.  Lytes: Lytes WNL - BID potassium 432m Renal: SCr 0.63, good UOP 2 Pulm: R-pleural effusion- no plan for thoracentesis at this time, doing well on RA - symbicort  Cards: BP 125/83, HR 113. 12/13 ECHO- EF 55-60%.  Hepatobil: Mild bump in AST and ALT yesterday, Alk phos trending back down,Tbili wnl. TG 99 on 12/13. Neuro: Bipolar disorder- depressed affect, geodon, lithium ID: HCAP +/- intraabdominal infection (abx d7) on vancomycin and merrem. WBC 12.6 as of 12/16, afebrile, LA wnl.   12/12 Blood >> NEG 12/12 Abscess cx >> NEG  12/12 Vanc >>       VT 12/14 = 15 on 1g IV q8h -at goal at Css, drawn correctly.  12/12 Merrem >>  Best Practices: Lovenox, MC   TPN Access: PICC placement 12/13 TPN start date: 12/13 >> present  Current Nutrition:  Clinimix E5/15 @ 100 cc/hr + lipids 20% at 1036mr Full liquid diet  Nutritional Goals:  Per RD recommendations on 06/21/15:  2100-2300 KCal/day 115-130 g protein/day Fluid >2.1L  Plan:  - Continue Clinimix E 5/15 at goal rate of 100 mL/hr + lipids at 10 mL/hr - provides 120g protein and 2184 kcal/day which is 100% goal - Daily multivitamin and trace elements - DC SSI + CBGs since pt has not required any insulin coverage - F/u AM labs - F/u  advancement of diet and ability to wean TPN  Salome Arnt, PharmD, BCPS Pager # [phone] 06/23/2015 8:03 AM

## 2015-06-23 NOTE — Progress Notes (Signed)
Patient ID: Anita Villarreal, female   DOB: 08/30/1970, 44 y.o.   MRN: [id number] [facility] Progress Note:   * No surgery found *  Subjective: Mental status is more alert today.  Pain med changed to dilaudid q 4 hours.  Patient complaining that pain control is worse Objective: Vital signs in last 24 hours: Temp:  [98.5 F (36.9 C)-99.8 F (37.7 C)] 99.1 F (37.3 C) (12/18 0611) Pulse Rate:  [113-120] 113 (12/18 0611) Resp:  [16-17] 16 (12/18 0611) BP: (115-125)/(66-83) 125/83 mmHg (12/18 0611) SpO2:  [96 %-100 %] 98 % (12/18 0906)  Intake/Output from previous day: 12/17 0701 - 12/18 0700 In: 2491 [P.O.:700; IV Piggyback:920; TPN:851] Out: 5865 [Urine:5650; Drains:215] Intake/Output this shift:    Physical Exam: Work of breathing is normal.  Drainage appears yellow with flocculation.    Lab Results:  Results for orders placed or performed during the hospital encounter of 06/17/15 (from the past 48 hour(s))  Glucose, capillary     Status: Abnormal   Collection Time: 06/21/15 12:37 PM  Result Value Ref Range   Glucose-Capillary 103 (H) 65 - 99 mg/dL  Glucose, capillary     Status: Abnormal   Collection Time: 06/21/15  6:39 PM  Result Value Ref Range   Glucose-Capillary 100 (H) 65 - 99 mg/dL  Glucose, capillary     Status: Abnormal   Collection Time: 06/22/15 12:01 AM  Result Value Ref Range   Glucose-Capillary 107 (H) 65 - 99 mg/dL  Basic metabolic panel     Status: Abnormal   Collection Time: 06/22/15  5:21 AM  Result Value Ref Range   Sodium 137 135 - 145 mmol/L   Potassium 4.0 3.5 - 5.1 mmol/L   Chloride 105 101 - 111 mmol/L   CO2 23 22 - 32 mmol/L   Glucose, Bld 113 (H) 65 - 99 mg/dL   BUN 6 6 - 20 mg/dL   Creatinine, Ser 0.64 0.44 - 1.00 mg/dL   Calcium 8.3 (L) 8.9 - 10.3 mg/dL   GFR calc non Af Amer >60 >60 mL/min   GFR calc Af Amer >60 >60 mL/min    Comment: (NOTE) The eGFR has been calculated using the CKD EPI equation. This calculation has not  been validated in all clinical situations. eGFR's persistently <60 mL/min signify possible Chronic Kidney Disease.    Anion gap 9 5 - 15  Magnesium     Status: None   Collection Time: 06/22/15  5:21 AM  Result Value Ref Range   Magnesium 1.9 1.7 - 2.4 mg/dL  Phosphorus     Status: None   Collection Time: 06/22/15  5:21 AM  Result Value Ref Range   Phosphorus 4.1 2.5 - 4.6 mg/dL  Glucose, capillary     Status: Abnormal   Collection Time: 06/22/15  6:22 AM  Result Value Ref Range   Glucose-Capillary 125 (H) 65 - 99 mg/dL  Glucose, capillary     Status: Abnormal   Collection Time: 06/22/15 12:45 PM  Result Value Ref Range   Glucose-Capillary 101 (H) 65 - 99 mg/dL  Glucose, capillary     Status: Abnormal   Collection Time: 06/22/15  5:30 PM  Result Value Ref Range   Glucose-Capillary 103 (H) 65 - 99 mg/dL  Glucose, capillary     Status: Abnormal   Collection Time: 06/22/15 11:50 PM  Result Value Ref Range   Glucose-Capillary 108 (H) 65 - 99 mg/dL  Basic metabolic panel     Status: Abnormal  Collection Time: 06/23/15  5:39 AM  Result Value Ref Range   Sodium 136 135 - 145 mmol/L   Potassium 4.0 3.5 - 5.1 mmol/L   Chloride 104 101 - 111 mmol/L   CO2 23 22 - 32 mmol/L   Glucose, Bld 111 (H) 65 - 99 mg/dL   BUN 9 6 - 20 mg/dL   Creatinine, Ser 0.63 0.44 - 1.00 mg/dL   Calcium 8.6 (L) 8.9 - 10.3 mg/dL   GFR calc non Af Amer >60 >60 mL/min   GFR calc Af Amer >60 >60 mL/min    Comment: (NOTE) The eGFR has been calculated using the CKD EPI equation. This calculation has not been validated in all clinical situations. eGFR's persistently <60 mL/min signify possible Chronic Kidney Disease.    Anion gap 9 5 - 15  Glucose, capillary     Status: Abnormal   Collection Time: 06/23/15  6:12 AM  Result Value Ref Range   Glucose-Capillary 113 (H) 65 - 99 mg/dL    Radiology/Results: No results found.  Anti-infectives: Anti-infectives    Start     Dose/Rate Route Frequency Ordered  Stop   06/17/15 1700  vancomycin (VANCOCIN) IVPB 1000 mg/200 mL premix     1,000 mg 200 mL/hr over 60 Minutes Intravenous Every 8 hours 06/17/15 0829     06/17/15 1700  piperacillin-tazobactam (ZOSYN) IVPB 3.375 g  Status:  Discontinued     3.375 g 12.5 mL/hr over 240 Minutes Intravenous 3 times per day 06/17/15 0832 06/17/15 1225   06/17/15 1300  meropenem (MERREM) 1 g in sodium chloride 0.9 % 100 mL IVPB     1 g 200 mL/hr over 30 Minutes Intravenous Every 8 hours 06/17/15 1227     06/17/15 0845  vancomycin (VANCOCIN) 2,000 mg in sodium chloride 0.9 % 500 mL IVPB     2,000 mg 250 mL/hr over 120 Minutes Intravenous  Once 06/17/15 0829 06/17/15 1205   06/17/15 0845  piperacillin-tazobactam (ZOSYN) IVPB 3.375 g     3.375 g 100 mL/hr over 30 Minutes Intravenous  Once 06/17/15 0832 06/17/15 0947   06/17/15 0830  piperacillin-tazobactam (ZOSYN) IVPB 3.375 g  Status:  Discontinued     3.375 g 12.5 mL/hr over 240 Minutes Intravenous 3 times per day 06/17/15 5102 06/17/15 5852      Assessment/Plan: Problem List: Patient Active Problem List   Diagnosis Date Noted  . Anemia 06/17/2015  . Hypokalemia 06/17/2015  . HCAP (healthcare-associated pneumonia) 06/17/2015  . Pneumonia 06/17/2015  . Intra-abdominal infection   . Abdominal abscess ([place])   . Bipolar I disorder, most recent episode mixed ([place]) 06/07/2015  . Acute gastric perforation ([place]) 06/04/2015    Continue clears po and TNA;  Will increase frequency of Dilaudid to q 2 hours.   * No surgery found *    LOS: 5 days   Matt B. Hassell Done, MD, [facility] [phone] beeper [phone]  06/23/2015 9:25 AM

## 2015-06-24 ENCOUNTER — Inpatient Hospital Stay (HOSPITAL_COMMUNITY): Payer: Medicaid Other

## 2015-06-24 ENCOUNTER — Encounter (HOSPITAL_COMMUNITY): Payer: Self-pay | Admitting: Radiology

## 2015-06-24 LAB — MAGNESIUM: Magnesium: 1.9 mg/dL (ref 1.7–2.4)

## 2015-06-24 LAB — DIFFERENTIAL
BASOS PCT: 0 %
Basophils Absolute: 0 10*3/uL (ref 0.0–0.1)
EOS PCT: 3 %
Eosinophils Absolute: 0.4 10*3/uL (ref 0.0–0.7)
LYMPHS ABS: 2.9 10*3/uL (ref 0.7–4.0)
Lymphocytes Relative: 22 %
MONOS PCT: 8 %
Monocytes Absolute: 1.1 10*3/uL — ABNORMAL HIGH (ref 0.1–1.0)
NEUTROS ABS: 9 10*3/uL — AB (ref 1.7–7.7)
Neutrophils Relative %: 67 %

## 2015-06-24 LAB — COMPREHENSIVE METABOLIC PANEL
ALBUMIN: 2 g/dL — AB (ref 3.5–5.0)
ALK PHOS: 199 U/L — AB (ref 38–126)
ALT: 75 U/L — AB (ref 14–54)
AST: 131 U/L — ABNORMAL HIGH (ref 15–41)
Anion gap: 8 (ref 5–15)
BUN: 9 mg/dL (ref 6–20)
CALCIUM: 8.7 mg/dL — AB (ref 8.9–10.3)
CO2: 24 mmol/L (ref 22–32)
CREATININE: 0.61 mg/dL (ref 0.44–1.00)
Chloride: 102 mmol/L (ref 101–111)
GFR calc Af Amer: >60 mL/min (ref >60)
GFR calc non Af Amer: >60 mL/min (ref >60)
GLUCOSE: 105 mg/dL — AB (ref 65–99)
Potassium: 4.1 mmol/L (ref 3.5–5.1)
SODIUM: 134 mmol/L — AB (ref 135–145)
TOTAL PROTEIN: 6.7 g/dL (ref 6.5–8.1)
Total Bilirubin: 0.3 mg/dL (ref 0.3–1.2)

## 2015-06-24 LAB — TRIGLYCERIDES: TRIGLYCERIDES: 125 mg/dL (ref <150)

## 2015-06-24 LAB — CBC
HEMATOCRIT: 25.2 % — AB (ref 36.0–46.0)
Hemoglobin: 8.2 g/dL — ABNORMAL LOW (ref 12.0–15.0)
MCH: 28.1 pg (ref 26.0–34.0)
MCHC: 32.5 g/dL (ref 30.0–36.0)
MCV: 86.3 fL (ref 78.0–100.0)
PLATELETS: 800 10*3/uL — AB (ref 150–400)
RBC: 2.92 MIL/uL — ABNORMAL LOW (ref 3.87–5.11)
RDW: 17.2 % — AB (ref 11.5–15.5)
WBC: 13.4 10*3/uL — ABNORMAL HIGH (ref 4.0–10.5)

## 2015-06-24 LAB — GLUCOSE, CAPILLARY
Glucose-Capillary: 114 mg/dL — ABNORMAL HIGH (ref 65–99)
Glucose-Capillary: 111 mg/dL — ABNORMAL HIGH (ref 65–99)
Glucose-Capillary: 109 mg/dL — ABNORMAL HIGH (ref 65–99)

## 2015-06-24 LAB — PHOSPHORUS: Phosphorus: 4.3 mg/dL (ref 2.5–4.6)

## 2015-06-24 LAB — PREALBUMIN: Prealbumin: 12.8 mg/dL — ABNORMAL LOW (ref 18–38)

## 2015-06-24 MED ORDER — IOHEXOL 300 MG/ML  SOLN
100.0000 mL | Freq: Once | INTRAMUSCULAR | Status: AC | PRN
  Administered 2015-06-24: 100 mL via INTRAVENOUS

## 2015-06-24 MED ORDER — IOHEXOL 300 MG/ML  SOLN
25.0000 mL | INTRAMUSCULAR | Status: AC
  Administered 2015-06-24 (×2): 25 mL via ORAL

## 2015-06-24 MED ORDER — TRACE MINERALS CR-CU-MN-SE-ZN 10-1000-500-60 MCG/ML IV SOLN
INTRAVENOUS | Status: AC
  Administered 2015-06-24: 18:00:00 via INTRAVENOUS
  Filled 2015-06-24: qty 1800

## 2015-06-24 MED ORDER — FAT EMULSION 20 % IV EMUL
240.0000 mL | INTRAVENOUS | Status: AC
  Administered 2015-06-24: 240 mL via INTRAVENOUS
  Filled 2015-06-24: qty 250

## 2015-06-24 NOTE — Progress Notes (Signed)
[facility] Progress Note     Subjective: Pt doing well.  No N/V, tolerating fulls, but still has anorexia.  Ambulating well.  Wound vac with serosang drainage.  IR drain is becoming less purulent and more clear.  Having BM's and good flatus now.  Pending wound vac change today.  May be able to d/c it.    Objective: Vital signs in last 24 hours: Temp:  [97.9 F (36.6 C)-98.8 F (37.1 C)] 98.8 F (37.1 C) (12/19 0545) Pulse Rate:  [103-110] 110 (12/19 0545) Resp:  [16] 16 (12/19 0545) BP: (109-126)/(65-74) 125/65 mmHg (12/19 0545) SpO2:  [98 %-100 %] 100 % (12/19 0545) Last BM Date: 06/23/15  Intake/Output from previous day: 12/18 0701 - 12/19 0700 In: 220 [P.O.:200] Out: 4770 [Urine:4700; Drains:70] Intake/Output this shift: Total I/O In: 8325.1 [IV Piggyback:300; TPN:8025.1] Out: -   PE: Gen:  Alert, NAD, pleasant Abd: Soft, ND, minimal tenderness, +BS, no HSM, wound vac in place with serosanguinous drainage 42mL/24hr, and 57mL/25hr seropurulent drainage from IR bag.    Lab Results:   Recent Labs  06/24/15 0420  WBC 13.4*  HGB 8.2*  HCT 25.2*  PLT 800*   BMET  Recent Labs  06/23/15 0539 06/24/15 0420  NA 136 134*  K 4.0 4.1  CL 104 102  CO2 23 24  GLUCOSE 111* 105*  BUN 9 9  CREATININE 0.63 0.61  CALCIUM 8.6* 8.7*   PT/INR No results for input(s): LABPROT, INR in the last 72 hours. CMP     Component Value Date/Time   NA 134* 06/24/2015 0420   K 4.1 06/24/2015 0420   CL 102 06/24/2015 0420   CO2 24 06/24/2015 0420   GLUCOSE 105* 06/24/2015 0420   BUN 9 06/24/2015 0420   CREATININE 0.61 06/24/2015 0420   CALCIUM 8.7* 06/24/2015 0420   PROT 6.7 06/24/2015 0420   ALBUMIN 2.0* 06/24/2015 0420   AST 131* 06/24/2015 0420   ALT 75* 06/24/2015 0420   ALKPHOS 199* 06/24/2015 0420   BILITOT 0.3 06/24/2015 0420   GFRNONAA >60 06/24/2015 0420   GFRAA >60 06/24/2015 0420   Lipase     Component Value Date/Time   LIPASE 36 06/17/2015  0428       Studies/Results: No results found.  Anti-infectives: Anti-infectives    Start     Dose/Rate Route Frequency Ordered Stop   06/17/15 1700  vancomycin (VANCOCIN) IVPB 1000 mg/200 mL premix  Status:  Discontinued     1,000 mg 200 mL/hr over 60 Minutes Intravenous Every 8 hours 06/17/15 0829 06/23/15 1314   06/17/15 1700  piperacillin-tazobactam (ZOSYN) IVPB 3.375 g  Status:  Discontinued     3.375 g 12.5 mL/hr over 240 Minutes Intravenous 3 times per day 06/17/15 0832 06/17/15 1225   06/17/15 1300  meropenem (MERREM) 1 g in sodium chloride 0.9 % 100 mL IVPB     1 g 200 mL/hr over 30 Minutes Intravenous Every 8 hours 06/17/15 1227     06/17/15 0845  vancomycin (VANCOCIN) 2,000 mg in sodium chloride 0.9 % 500 mL IVPB     2,000 mg 250 mL/hr over 120 Minutes Intravenous  Once 06/17/15 0829 06/17/15 1205   06/17/15 0845  piperacillin-tazobactam (ZOSYN) IVPB 3.375 g     3.375 g 100 mL/hr over 30 Minutes Intravenous  Once 06/17/15 0832 06/17/15 0947   06/17/15 0830  piperacillin-tazobactam (ZOSYN) IVPB 3.375 g  Status:  Discontinued     3.375 g 12.5 mL/hr over 240 Minutes Intravenous  3 times per day 06/17/15 0829 06/17/15 [id number]       Assessment/Plan Perforated lesser curve gastric ulcer with purulent contamination of the abdominal cavity POD #20 s/p Ex Lap with omental patch repair, drain placement Intraabdominal fluid collections concerning for abscess -IR drain in place with purulent drainage, but becoming more serous, cont for now -IVF, pain control, antiemetics (Vanc discontinued after 7days HCAP, Meropenem - for intraabdominal infection Day #8) Having BM's and flatus.  Cultures showed no growth to date. -Continue full liquids -Ambulate and IS  -Repeat CT today, drain output 4135mL/24hr PCM - cont TPN, start weaning Bipolar -Oral meds -Appreciate psych consult -This had a large effect on her compliance with medical care during her last admission Hypokalemia -This  was a chronic issue on last admission -Normal today Anemia of chronic disease -To follow up as an outpatient LE Edema Pleural Effusion/HCAP? - finished 7 day course for HCAP DVT proph - SCD's and lovenox (60mg )      LOS: 6 days    Anita Villarreal 06/24/2015, 10:02 AM Pager: [phone]

## 2015-06-24 NOTE — Consult Note (Signed)
[facility] Face-to-Face Psychiatry Consult follow-up  Reason for Consult:  Bipolar disorder medication management Referring Physician:  Dr. Edison Pace Patient Identification: Anita Villarreal MRN:  [id number] Principal Diagnosis: Bipolar I disorder, most recent episode mixed [facility]) Diagnosis:   Patient Active Problem List   Diagnosis Date Noted  . Anemia [D64.9] 06/17/2015  . Hypokalemia [E87.6] 06/17/2015  . HCAP (healthcare-associated pneumonia) [J18.9] 06/17/2015  . Pneumonia [J18.9] 06/17/2015  . Intra-abdominal infection [B99.9]   . Abdominal abscess ([place]) [K65.1]   . Bipolar I disorder, most recent episode mixed ([place]) [F31.60] 06/07/2015  . Acute gastric perforation (HCC) [K25.1] 06/04/2015    Total Time spent with patient: 30 minutes  Subjective:   Anita Villarreal is a 44 y.o. female patient admitted with bipolar disorder and abdominal pain.  HPI:  Anita Villarreal is a 44 years old African-American female admitted to [facility] with the abdominal pain which has been gradually worsening since discharged from the hospital on 06/15/2015. Psychiatric consultation requested for psychiatric medication management for the bipolar disorder. Patient is currently on the nothing by mouth and probably might stay the same about a week or so so needed appropriate medication for perioral. I have discussed with the pharmacist who recommended Geodon 10 or 20 mg every 2-4 hours as needed with a maximum dose of 40 mg a day as it was never tried over 40 mg a day according to literature. Patient is currently appeared lying in her bed, sedated with the medications and briefly responded verbally. Patient is known to this provider from her recent [facility] admission for perforated gastric ulcer and pneumonia.  patient was appeared highly manic with the paranoid delusional thoughts at that time. It is not clear patient has received a her antipsychotic medication while she was at home. Patient was able  to switch from the IM medication to oral medication before she went home during recent hospitalization. Past Psychiatric History: Outpatient psychiatrist services by psychiatric nurse provider Metta Clines. She has no previous [facility] admissions.  Interval history: Patient seen today face-to-face for psychiatric consultation follow-up. Case discussed with staff RN. Patient has been doing better since last visit and able to take oral medication. She is currently taking Geodon 80 mg twice daily, lithium 300 mg twice daily and hydroxyzine 50 mg at bedtime and able to tolerate well without adverse affects. Patient denied current symptoms of depression, mania, anxiety, psychosis and also denied auditory/visual hallucinations, delusions paranoia. Patient has no active suicidal ideation/homicidal ideation, intention or plans. Patient contract for safety during this visit.   We'll check her lithium level 2 days from now for therapeutic window as well as monitor her thyroid and blood counts.   Risk to Self: Is patient at risk for suicide?: No Risk to Others:   Prior Inpatient Therapy:   Prior Outpatient Therapy:    Past Medical History:  Past Medical History  Diagnosis Date  . Bipolar 1 disorder ([place])   . Manic depressive disorder ([place])   . Gastric ulcer with perforation ([place]) 06/2015  . Renal insufficiency   . Abscess of abdominal wall 06/2015  . Headache     Past Surgical History  Procedure Laterality Date  . Cholecystectomy    . Appendectomy    . Exploratory laparotomy  06/04/15    Phillip Heal patch repair of gastric ulcer  . Laparotomy N/A 06/04/2015    Procedure: EXPLORATORY LAPAROTOMY WITH REPAIROF GASTRIC ULCER AND BIOPSY OF GASTRIC ULCER;  Surgeon: Erroll Luna, MD;  Location: [place];  Service:  General;  Laterality: N/A;  . Perforated bowel     Family History:  Family History  Problem Relation Age of Onset  . Diabetes Mother   . CAD Mother   . Aneurysm Maternal Grandmother    Family  Psychiatric  History: bipolar disorder in her uncle. Social History:  History  Alcohol Use No     History  Drug Use No    Social History   Social History  . Marital Status: Single    Spouse Name: N/A  . Number of Children: N/A  . Years of Education: N/A   Social History Main Topics  . Smoking status: Current Every Day Smoker -- 0.50 packs/day for 15 years    Types: Cigarettes  . Smokeless tobacco: Never Used  . Alcohol Use: No  . Drug Use: No  . Sexual Activity: Not Asked   Other Topics Concern  . None   Social History Narrative   Additional Social History:                          Allergies:   Allergies  Allergen Reactions  . Erythromycin Other (See Comments)    Tongue swelling   . Sulfa Antibiotics     swelling    Labs:  Results for orders placed or performed during the hospital encounter of 06/17/15 (from the past 48 hour(s))  Glucose, capillary     Status: Abnormal   Collection Time: 06/22/15  5:30 PM  Result Value Ref Range   Glucose-Capillary 103 (H) 65 - 99 mg/dL  Glucose, capillary     Status: Abnormal   Collection Time: 06/22/15 11:50 PM  Result Value Ref Range   Glucose-Capillary 108 (H) 65 - 99 mg/dL  Basic metabolic panel     Status: Abnormal   Collection Time: 06/23/15  5:39 AM  Result Value Ref Range   Sodium 136 135 - 145 mmol/L   Potassium 4.0 3.5 - 5.1 mmol/L   Chloride 104 101 - 111 mmol/L   CO2 23 22 - 32 mmol/L   Glucose, Bld 111 (H) 65 - 99 mg/dL   BUN 9 6 - 20 mg/dL   Creatinine, Ser 0.63 0.44 - 1.00 mg/dL   Calcium 8.6 (L) 8.9 - 10.3 mg/dL   GFR calc non Af Amer >60 >60 mL/min   GFR calc Af Amer >60 >60 mL/min    Comment: (NOTE) The eGFR has been calculated using the CKD EPI equation. This calculation has not been validated in all clinical situations. eGFR's persistently <60 mL/min signify possible Chronic Kidney Disease.    Anion gap 9 5 - 15  Glucose, capillary     Status: Abnormal   Collection Time: 06/23/15   6:12 AM  Result Value Ref Range   Glucose-Capillary 113 (H) 65 - 99 mg/dL  Glucose, capillary     Status: Abnormal   Collection Time: 06/23/15 12:26 PM  Result Value Ref Range   Glucose-Capillary 129 (H) 65 - 99 mg/dL  Glucose, capillary     Status: Abnormal   Collection Time: 06/23/15 11:44 PM  Result Value Ref Range   Glucose-Capillary 114 (H) 65 - 99 mg/dL  Comprehensive metabolic panel     Status: Abnormal   Collection Time: 06/24/15  4:20 AM  Result Value Ref Range   Sodium 134 (L) 135 - 145 mmol/L   Potassium 4.1 3.5 - 5.1 mmol/L   Chloride 102 101 - 111 mmol/L   CO2 24 22 -  32 mmol/L   Glucose, Bld 105 (H) 65 - 99 mg/dL   BUN 9 6 - 20 mg/dL   Creatinine, Ser 0.61 0.44 - 1.00 mg/dL   Calcium 8.7 (L) 8.9 - 10.3 mg/dL   Total Protein 6.7 6.5 - 8.1 g/dL   Albumin 2.0 (L) 3.5 - 5.0 g/dL   AST 131 (H) 15 - 41 U/L   ALT 75 (H) 14 - 54 U/L   Alkaline Phosphatase 199 (H) 38 - 126 U/L   Total Bilirubin 0.3 0.3 - 1.2 mg/dL   GFR calc non Af Amer >60 >60 mL/min   GFR calc Af Amer >60 >60 mL/min    Comment: (NOTE) The eGFR has been calculated using the CKD EPI equation. This calculation has not been validated in all clinical situations. eGFR's persistently <60 mL/min signify possible Chronic Kidney Disease.    Anion gap 8 5 - 15  Magnesium     Status: None   Collection Time: 06/24/15  4:20 AM  Result Value Ref Range   Magnesium 1.9 1.7 - 2.4 mg/dL  Phosphorus     Status: None   Collection Time: 06/24/15  4:20 AM  Result Value Ref Range   Phosphorus 4.3 2.5 - 4.6 mg/dL  CBC     Status: Abnormal   Collection Time: 06/24/15  4:20 AM  Result Value Ref Range   WBC 13.4 (H) 4.0 - 10.5 K/uL   RBC 2.92 (L) 3.87 - 5.11 MIL/uL   Hemoglobin 8.2 (L) 12.0 - 15.0 g/dL   HCT 25.2 (L) 36.0 - 46.0 %   MCV 86.3 78.0 - 100.0 fL   MCH 28.1 26.0 - 34.0 pg   MCHC 32.5 30.0 - 36.0 g/dL   RDW 17.2 (H) 11.5 - 15.5 %   Platelets 800 (H) 150 - 400 K/uL  Differential     Status: Abnormal    Collection Time: 06/24/15  4:20 AM  Result Value Ref Range   Neutrophils Relative % 67 %   Lymphocytes Relative 22 %   Monocytes Relative 8 %   Eosinophils Relative 3 %   Basophils Relative 0 %   Neutro Abs 9.0 (H) 1.7 - 7.7 K/uL   Lymphs Abs 2.9 0.7 - 4.0 K/uL   Monocytes Absolute 1.1 (H) 0.1 - 1.0 K/uL   Eosinophils Absolute 0.4 0.0 - 0.7 K/uL   Basophils Absolute 0.0 0.0 - 0.1 K/uL   RBC Morphology POLYCHROMASIA PRESENT     Comment: TARGET CELLS   WBC Morphology ATYPICAL LYMPHOCYTES   Prealbumin     Status: Abnormal   Collection Time: 06/24/15  4:20 AM  Result Value Ref Range   Prealbumin 12.8 (L) 18 - 38 mg/dL  Triglycerides     Status: None   Collection Time: 06/24/15  4:20 AM  Result Value Ref Range   Triglycerides 125 <150 mg/dL  Glucose, capillary     Status: Abnormal   Collection Time: 06/24/15  5:40 AM  Result Value Ref Range   Glucose-Capillary 114 (H) 65 - 99 mg/dL  Glucose, capillary     Status: Abnormal   Collection Time: 06/24/15 12:52 PM  Result Value Ref Range   Glucose-Capillary 111 (H) 65 - 99 mg/dL    Current Facility-Administered Medications  Medication Dose Route Frequency Provider Last Rate Last Dose  . Marland KitchenTPN (CLINIMIX-E) Adult   Intravenous Continuous TPN Valeda Malm Rumbarger, RPH 100 mL/hr at 06/23/15 1809     And  . fat emulsion 20 % infusion 240 mL  240 mL Intravenous Continuous TPN Valeda Malm Rumbarger, RPH 10 mL/hr at 06/23/15 1809 240 mL at 06/23/15 1809  . albuterol (PROVENTIL) (2.5 MG/3ML) 0.083% nebulizer solution 2.5 mg  2.5 mg Nebulization Q6H PRN Nat Christen, PA-C      . alum & mag hydroxide-simeth (MAALOX/MYLANTA) 200-200-20 MG/5ML suspension 30 mL  30 mL Oral Q4H PRN Annita Brod, MD   30 mL at 06/23/15 1651  . antiseptic oral rinse (CPC / CETYLPYRIDINIUM CHLORIDE 0.05%) solution 7 mL  7 mL Mouth Rinse q12n4p Erroll Luna, MD   7 mL at 06/24/15 1359  . budesonide-formoterol (SYMBICORT) 80-4.5 MCG/ACT inhaler 2 puff  2 puff Inhalation  BID Nat Christen, PA-C   2 puff at 06/23/15 1936  . chlorhexidine (PERIDEX) 0.12 % solution 15 mL  15 mL Mouth Rinse BID Erroll Luna, MD   15 mL at 06/24/15 0753  . diphenhydrAMINE (BENADRYL) capsule 25 mg  25 mg Oral Q6H PRN Dianne Dun, NP   25 mg at 06/24/15 0425  . enoxaparin (LOVENOX) injection 60 mg  0.5 mg/kg Subcutaneous Daily Saverio Danker, PA-C   60 mg at 06/24/15 9924  . TPN (CLINIMIX-E) Adult   Intravenous Continuous TPN Lauren D Bajbus, RPH       And  . fat emulsion 20 % infusion 240 mL  240 mL Intravenous Continuous TPN Lauren D Bajbus, RPH      . HYDROmorphone (DILAUDID) injection 1 mg  1 mg Intravenous Q2H PRN Johnathan Hausen, MD   1 mg at 06/24/15 1359  . hydrOXYzine (ATARAX/VISTARIL) tablet 50 mg  50 mg Oral QHS Saverio Danker, PA-C   50 mg at 06/23/15 2057  . lithium carbonate capsule 300 mg  300 mg Oral BID Saverio Danker, PA-C   300 mg at 06/24/15 2683  . meropenem (MERREM) 1 g in sodium chloride 0.9 % 100 mL IVPB  1 g Intravenous Q8H Rachel L Rumbarger, RPH 200 mL/hr at 06/24/15 1259 1 g at 06/24/15 1259  . methocarbamol (ROBAXIN) 1,000 mg in dextrose 5 % 50 mL IVPB  1,000 mg Intravenous Q8H PRN Nat Christen, PA-C   1,000 mg at 06/22/15 2217  . potassium chloride 20 MEQ/15ML (10%) solution 40 mEq  40 mEq Oral BID Annita Brod, MD   40 mEq at 06/21/15 2120  . sodium chloride 0.9 % injection 10-40 mL  10-40 mL Intracatheter PRN Ambrose Finland, MD   10 mL at 06/24/15 0420  . ziprasidone (GEODON) capsule 80 mg  80 mg Oral BID WC Saverio Danker, PA-C   80 mg at 06/24/15 4196    Musculoskeletal: Strength & Muscle Tone: decreased Gait & Station: unable to stand Patient leans: N/A  Psychiatric Specialty Exam: ROS   Blood pressure 125/65, pulse 110, temperature 98.8 F (37.1 C), temperature source Oral, resp. rate 16, height _0  (1.727 m), weight 117.7 kg (259 lb 7.7 oz), last menstrual period 04/04/2015, SpO2 100 %.Body mass index is 39.46 kg/(m^2).   General Appearance: Casual  Eye Contact::  Good  Speech:  Clear and Coherent  Volume:  Normal  Mood:  Euthymic  Affect:  Appropriate and Congruent  Thought Process:  Coherent  Orientation:  Full (Time, Place, and Person)  Thought Content:  WDL  Suicidal Thoughts:  No  Homicidal Thoughts:  No  Memory:  Immediate;   Good Recent;   Good Remote;   Fair  Judgement:  Good  Insight:  Good  Psychomotor Activity:  Decreased  Concentration:  Good  Recall:  [employer] of Knowledge:Good  Language: Fair  Akathisia:  Negative  Handed:  Right  AIMS (if indicated):     Assets:  Communication Skills Desire for Improvement Financial Resources/Insurance Housing Intimacy Leisure Time Resilience Social Support  ADL's:  Impaired  Cognition: Impaired,  Moderate  Sleep:      Treatment Plan Summary: Daily contact with patient to assess and evaluate symptoms and progress in treatment and Medication management  Continue Geodon 80 mg twice daily for bipolar mood swings Continue lithium 300 mg twice daily for bipolar and will check lithium level for therapeutic window 2 days from now Continue hydroxyzine 50 mg by mouth at bedtime for insomnia Appreciate psychiatric consultation and follow up as clinically required Please contact [phone] or [phone] if needs further assistance  Disposition: Patient does not meet criteria for psychiatric inpatient admission. Supportive therapy provided about ongoing stressors.  Kadeidra Coryell,JANARDHAHA R. 06/24/2015 3:57 PM

## 2015-06-24 NOTE — Progress Notes (Signed)
PARENTERAL NUTRITION CONSULT NOTE -Follow-up  Pharmacy Consult for TPN Indication: Gastric perforation  Allergies  Allergen Reactions  . Erythromycin Other (See Comments)    Tongue swelling   . Sulfa Antibiotics     swelling    Patient Measurements: Height: 5' 8"  (172.7 cm) Weight: 259 lb 7.7 oz (117.7 kg) IBW/kg (Calculated) : 63.9 Adjusted Body Weight: 77 kg BMI: 39  Vital Signs: Temp: 98.8 F (37.1 C) (12/19 0545) Temp Source: Oral (12/19 0545) BP: 125/65 mmHg (12/19 0545) Pulse Rate: 110 (12/19 0545)  Labs:  Recent Labs  06/24/15 0420  WBC 13.4*  HGB 8.2*  HCT 25.2*  PLT 800*     Recent Labs  06/22/15 0521 06/23/15 0539 06/24/15 0420  NA 137 136 134*  K 4.0 4.0 4.1  CL 105 104 102  CO2 23 23 24   GLUCOSE 113* 111* 105*  BUN 6 9 9   CREATININE 0.64 0.63 0.61  CALCIUM 8.3* 8.6* 8.7*  MG 1.9  --  1.9  PHOS 4.1  --  4.3  PROT  --   --  6.7  ALBUMIN  --   --  2.0*  AST  --   --  131*  ALT  --   --  75*  ALKPHOS  --   --  199*  BILITOT  --   --  0.3  PREALBUMIN  --   --  12.8*  TRIG  --   --  125   Estimated Creatinine Clearance: 118.5 mL/min (by C-G formula based on Cr of 0.61).    Recent Labs  06/23/15 1226 06/23/15 2344 06/24/15 0540  GLUCAP 129* 114* 114*   Insulin Requirements in the past 24 hours:  0 units SSI  Current Nutrition:  Clinimix E 5/15 at goal rate of 100 mL/hr + lipids at 10 mL/hr - provides 120g protein and 2184 kcal/day which is 100% goal Full liquid diet- eating ~30% of meals  Nutritional Goals:  Per RD recommendations on 06/21/15:  2100-2300 KCal/day 115-130 g protein/day Fluid >2.1L  Assessment: 44 year old female with recent history gastric perforation s/p exlap and patch repair of gastric ulcer perforation on 11/29. Patient was discharged on 12/10 with soft oral diet. Now re-admitted 12/12 with progressive worsening of abdominal pain, chills, nausea, anorexia, and loose stools. CT shows defect of the lesser  curve of the stomach with air bubbles concerning for perforated gastric ulcer, abscess or post-operative seroma.   Surgeries/Procedures: 12/12: Perc abd drain in IR - 68m purulent fluid aspirated from abscess  GI: GERD, gastric ulcers, perf -abd drain output 779m24hr.  LBM 12/18- BS and Flatus. Healing well and possible plan to d/c wound vac on discharge- timing still unclear. Advancing oral diet slowly and currently on a full liquid diet. Spoke with patient 12/19 and she does not feel like she has much of an appetite. Says when she eats, she is feeling nauseous and like she has reflux. Has had ~30% of her meals in the last day. Prealbumin improved at 12.8  Endo: CBGs controlled without need for insulin.   Lytes: Na a little low, K 4.1 - BID potassium 4044m Phos 4.3, Mag 1.9, CorCa ~10.2  Renal: SCr 0.61, CrCl >100m10mn- stable. good UOP 1.7  Pulm: R-pleural effusion- no plan for thoracentesis at this time, doing well on RA - symbicort   Cards: BP good, HR tachy. 12/13 ECHO- EF 55-60%.  No meds  Hepatobil: AST and ALT increased again to 131 and 75, respectively. Alk phos  trending back up to 199.Tbili wnl. Trigs 125  Neuro: Bipolar disorder- depressed affect, geodon, lithium  ID: HCAP +/- intraabdominal infection (abx D#8). WBC 13.4, afebrile, LA wnl.   12/12 Blood >> NEG 12/12 Abscess cx >> NEG  12/12 Vanc >> 12/18      VT 12/14 = 15 on 1g IV q8h -at goal at Css, drawn correctly.  12/12 Merrem >>  Best Practices: Lovenox, MC   TPN Access: PICC placement 12/13 TPN start date: 12/13 >>   Plan:  - Decrease TPN to try and stimulate appetite- discussed with patient and Jomarie Longs, PA with surgery- Clinimix E 5/15 to 79m/hr + lipids at 10 mL/hr - provides 90g protein and 1758kcal/day - Daily multivitamin and trace elements in TPN - BMET, mag and phos in AM - F/u advancement of diet and ability to wean TPN further  - continue merropenem at 1g IV q8h - follow LOT, clinical  progression, renal function  Breana Litts D. Gabrille Kilbride, PharmD, BCPS Clinical Pharmacist Pager: [phone]/19/2016 8:58 AM

## 2015-06-25 LAB — BASIC METABOLIC PANEL
ANION GAP: 9 (ref 5–15)
BUN: 9 mg/dL (ref 6–20)
CO2: 25 mmol/L (ref 22–32)
Calcium: 9.2 mg/dL (ref 8.9–10.3)
Chloride: 102 mmol/L (ref 101–111)
Creatinine, Ser: 0.64 mg/dL (ref 0.44–1.00)
GFR calc Af Amer: >60 mL/min (ref >60)
GLUCOSE: 110 mg/dL — AB (ref 65–99)
POTASSIUM: 4 mmol/L (ref 3.5–5.1)
Sodium: 136 mmol/L (ref 135–145)

## 2015-06-25 LAB — MAGNESIUM: Magnesium: 1.9 mg/dL (ref 1.7–2.4)

## 2015-06-25 LAB — GLUCOSE, CAPILLARY
GLUCOSE-CAPILLARY: 109 mg/dL — AB (ref 65–99)
GLUCOSE-CAPILLARY: 115 mg/dL — AB (ref 65–99)
GLUCOSE-CAPILLARY: 115 mg/dL — AB (ref 65–99)
Glucose-Capillary: 115 mg/dL — ABNORMAL HIGH (ref 65–99)

## 2015-06-25 LAB — PHOSPHORUS: Phosphorus: 3.9 mg/dL (ref 2.5–4.6)

## 2015-06-25 MED ORDER — M.V.I. ADULT IV INJ
INJECTION | INTRAVENOUS | Status: AC
  Administered 2015-06-25: 18:00:00 via INTRAVENOUS
  Filled 2015-06-25: qty 1200

## 2015-06-25 MED ORDER — FAT EMULSION 20 % IV EMUL
240.0000 mL | INTRAVENOUS | Status: AC
  Administered 2015-06-25: 240 mL via INTRAVENOUS
  Filled 2015-06-25: qty 250

## 2015-06-25 MED ORDER — BOOST / RESOURCE BREEZE PO LIQD
1.0000 | Freq: Three times a day (TID) | ORAL | Status: DC
  Administered 2015-06-25 – 2015-06-26 (×4): 1 via ORAL

## 2015-06-25 NOTE — Progress Notes (Signed)
PARENTERAL NUTRITION CONSULT NOTE -Follow-up  Pharmacy Consult for TPN Indication: Gastric perforation  Allergies  Allergen Reactions  . Erythromycin Other (See Comments)    Tongue swelling   . Sulfa Antibiotics     swelling    Patient Measurements: Height: 5' 8"  (172.7 cm) Weight: 259 lb 7.7 oz (117.7 kg) IBW/kg (Calculated) : 63.9 Adjusted Body Weight: 77 kg  Vital Signs: Temp: 98.3 F (36.8 C) (12/20 0556) Temp Source: Oral (12/20 0556) BP: 112/73 mmHg (12/20 0556) Pulse Rate: 105 (12/20 0556)  Labs:  Recent Labs  06/24/15 0420  WBC 13.4*  HGB 8.2*  HCT 25.2*  PLT 800*     Recent Labs  06/23/15 0539 06/24/15 0420 06/25/15 0422  NA 136 134* 136  K 4.0 4.1 4.0  CL 104 102 102  CO2 23 24 25   GLUCOSE 111* 105* 110*  BUN 9 9 9   CREATININE 0.63 0.61 0.64  CALCIUM 8.6* 8.7* 9.2  MG  --  1.9 1.9  PHOS  --  4.3 3.9  PROT  --  6.7  --   ALBUMIN  --  2.0*  --   AST  --  131*  --   ALT  --  75*  --   ALKPHOS  --  199*  --   BILITOT  --  0.3  --   PREALBUMIN  --  12.8*  --   TRIG  --  125  --    Estimated Creatinine Clearance: 118.5 mL/min (by C-G formula based on Cr of 0.64).    Recent Labs  06/24/15 1747 06/25/15 0050 06/25/15 0604  GLUCAP 109* 109* 115*   Insulin Requirements in the past 24 hours:  0 units SSI- discontinued  Current Nutrition:  Clinimix E 5/15 at 21m/hr + lipids at 10 mL/hr - provides 90g protein and 1758kcal/day Full liquid diet  Nutritional Goals:  Per RD recommendations on 06/21/15:  2100-2300 KCal/day 115-130 g protein/day Fluid >2.1L  Assessment: 44year old female with recent history gastric perforation s/p exlap and patch repair of gastric ulcer perforation on 11/29. Patient was discharged on 12/10 with soft oral diet. Now re-admitted 12/12 with progressive worsening of abdominal pain, chills, nausea, anorexia, and loose stools. CT shows defect of the lesser curve of the stomach with air bubbles concerning for  perforated gastric ulcer, abscess or post-operative seroma.   Surgeries/Procedures: 12/12: Perc abd drain in IR - 257mpurulent fluid aspirated from abscess  GI: GERD, gastric ulcers, perf -abd drain output 8545m4hr- output is more clear.  Having regular BMs, BS and flatus. Healing well and possible plan to d/c wound vac on 12/21. Advancing oral diet slowly and currently on a full liquid diet. Spoke with patient 12/19 and she does not feel like she has much of an appetite. Says when she eats, she is feeling nauseous and like she has reflux. No intake charted in last 24h. Plans to advance to soft diet Prealbumin improved at 12.8  Endo: CBGs controlled without need for insulin- discontinued   Lytes: K 4 - BID potassium 58m71mPhos 3.9, Mag 1.9, CorCa ~10.7  Renal: SCr 0.64, CrCl >100mL14m- stable.  Pulm: R-pleural effusion- no plan for thoracentesis at this time, doing well on RA - symbicort   Cards: BP good, HR tachy. 12/13 ECHO- EF 55-60%.  No meds  Hepatobil: AST and ALT increased again to 131 and 75, respectively. Alk phos trending back up to 199.Tbili wnl. Trigs 125  Neuro: Bipolar disorder- denies hallucinations, mania, anxiety,  and other symptoms. On Geodon and lithium. Lithium level to be checked 12/21  ID: HCAP - vancomycin course completed + intraabdominal infection (abx D#9). WBC 13.4, afebrile, LA wnl.   12/12 Blood >> NEG 12/12 Abscess cx >> NEG  12/12 Vanc >> 12/18      VT 12/14 = 15 on 1g IV q8h -at goal at Css, drawn correctly.  12/12 Merrem >>  Best Practices: Lovenox, MC   TPN Access: PICC placement 12/13 TPN start date: 12/13 >>   Plan:  - Decrease Clinimix E 5/15 to 94m/hr + lipids at 10 mL/hr - provides 60g protein and 1332kcal/day - Daily multivitamin and trace elements in TPN - BMET, mag and phos in AM - F/u advancement of diet and ability to stop TPN  Janaiyah Blackard D. Salim Forero, PharmD, BCPS Clinical Pharmacist Pager: [phone]/20/2016 8:05 AM

## 2015-06-25 NOTE — Care Management Note (Signed)
Case Management Note  Patient Details  Name: Bernadene BellMelanie Sargeant MRN: [id number] Date of Birth: 06/25/1971  Subjective/Objective:                    Action/Plan:  UR updated  Expected Discharge Date:                  Expected Discharge Plan:  Home w Home Health Services  In-House Referral:     Discharge planning Services     Post Acute Care Choice:    Choice offered to:     DME Arranged:    DME Agency:     HH Arranged:    HH Agency:     Status of Service:  In process, will continue to follow  Medicare Important Message Given:    Date Medicare IM Given:    Medicare IM give by:    Date Additional Medicare IM Given:    Additional Medicare Important Message give by:     If discussed at Long Length of Stay Meetings, dates discussed:    Additional Comments:  [name], Terris Germano Marie, RN 06/25/2015, 11:27 AM

## 2015-06-25 NOTE — Progress Notes (Signed)
[facility] Progress Note     Subjective: Pt feels good.  No N/V, tolerating fulls okay.  Very flat this am.  Having BM's and flatus.  Drain with serous drainage this am.  Not much pain.  Ambulating well in halls.    Objective: Vital signs in last 24 hours: Temp:  [98.3 F (36.8 C)-99.1 F (37.3 C)] 98.3 F (36.8 C) (12/20 0556) Pulse Rate:  [105] 105 (12/20 0556) Resp:  [16-17] 16 (12/20 0556) BP: (106-112)/(72-73) 112/73 mmHg (12/20 0556) SpO2:  [97 %-100 %] 97 % (12/20 0804) Last BM Date: 06/24/15  Intake/Output from previous day: 12/19 0701 - 12/20 0700 In: 8835.1 [IV Piggyback:300; TPN:8535.1] Out: 1285 [Urine:1200; Drains:85] Intake/Output this shift:    PE: Gen:  Alert, NAD, pleasant Abd: Soft, NT/ND, +BS, no HSM, wound covered with wound vac with serosanguinous drainage (10mL), IR drain in left abdomen with serous drainage (75mL)   Lab Results:   Recent Labs  06/24/15 0420  WBC 13.4*  HGB 8.2*  HCT 25.2*  PLT 800*   BMET  Recent Labs  06/24/15 0420 06/25/15 0422  NA 134* 136  K 4.1 4.0  CL 102 102  CO2 24 25  GLUCOSE 105* 110*  BUN 9 9  CREATININE 0.61 0.64  CALCIUM 8.7* 9.2   PT/INR No results for input(s): LABPROT, INR in the last 72 hours. CMP     Component Value Date/Time   NA 136 06/25/2015 0422   K 4.0 06/25/2015 0422   CL 102 06/25/2015 0422   CO2 25 06/25/2015 0422   GLUCOSE 110* 06/25/2015 0422   BUN 9 06/25/2015 0422   CREATININE 0.64 06/25/2015 0422   CALCIUM 9.2 06/25/2015 0422   PROT 6.7 06/24/2015 0420   ALBUMIN 2.0* 06/24/2015 0420   AST 131* 06/24/2015 0420   ALT 75* 06/24/2015 0420   ALKPHOS 199* 06/24/2015 0420   BILITOT 0.3 06/24/2015 0420   GFRNONAA >60 06/25/2015 0422   GFRAA >60 06/25/2015 0422   Lipase     Component Value Date/Time   LIPASE 36 06/17/2015 0428       Studies/Results: Ct Abdomen Pelvis W Contrast  06/24/2015  CLINICAL DATA:  Recent perforated gastric ulcer status post  exploratory laparotomy and closure with patch drain placement with progressively worsening abdominal pain EXAM: CT ABDOMEN AND PELVIS WITH CONTRAST TECHNIQUE: Multidetector CT imaging of the abdomen and pelvis was performed using the standard protocol following bolus administration of intravenous contrast. CONTRAST:  OMNIPAQUE IOHEXOL 300 MG/ML  SOLN COMPARISON:  06/17/2015 FINDINGS: Lower chest: Stable small to moderate right pleural effusion with underlying compressive atelectasis. Hepatobiliary: Prior cholecystectomy.  Liver appears normal. Pancreas: Normal Spleen: Normal Adrenals/Urinary Tract: Normal Stomach/Bowel: Fluid collection and here it to the posterior wall the stomach again identified measuring 63 x 30 mm, slightly smaller. Inferior to this a second fluid collection now measures 44 x 23 mm, decreased from 47 x 53 mm. The distal half of the ascending colon and the transverse colon as well as the proximal descending colon show mild mural edema, an appearance which was not previously present. Vascular/Lymphatic: No acute finding Reproductive: Unchanged appearance with cystic tubular structures in both adnexa possibly representing hydrosalpinx. Status post tubal ligation. Other: 2 cm fluid collection in the rectovesical pouch of the pelvis with subtle rim enhancement, slightly larger. Cranial to this, there is another fluid collection with thin rim enhancement measuring 55 x 23 mm as compared to 65 x 27 mm previously. A percutaneous drain has  been placed with tip in the left upper quadrant just inside the anterior abdominal wall. The tip is in an area of prior fluid collection adherent to the anterior abdominal wall, which now appears to be essentially resolved. There is a tiny focus of air just cranial to the drainage catheter likely related to the catheter itself. There remain significant inflammatory change within the anterior central and left peritoneum moved at the level of the stomach. Trace  ascites along the lateral margin of the liver right lobe, which appears partially loculated. This is similar to the prior study. Trace mildly loculated ascites in the region of the upper splenic hilum. Musculoskeletal: No acute osseous abnormalities. Mild residual body wall edema, significantly improved IMPRESSION: Percutaneous drain left anterior peritoneal cavity has essentially completely drained a fluid collection that was adherent to the left anterior abdominal wall. There remains significant inflammatory change throughout the anterior peritoneal at the level of the stomach. Two fluid collections adjacent to the posterior wall the stomach persists but show decreased size. Trace loculated ascites around the liver and spleen similar to prior study. Loculated fluid collections in the inferior pelvis, 1 of which is slightly smaller and the other is slightly larger when compared to prior study. New fairly extensive colon wall edematous change, a nonspecific finding. Possibility of infectious colitis not excluded. The appearance is not directly suggestive of pseudomembranous colitis but rather is nonspecific. Electronically Signed   By: Esperanza Heiraymond  Rubner M.D.   On: 06/24/2015 21:53    Anti-infectives: Anti-infectives    Start     Dose/Rate Route Frequency Ordered Stop   06/17/15 1700  vancomycin (VANCOCIN) IVPB 1000 mg/200 mL premix  Status:  Discontinued     1,000 mg 200 mL/hr over 60 Minutes Intravenous Every 8 hours 06/17/15 0829 06/23/15 1314   06/17/15 1700  piperacillin-tazobactam (ZOSYN) IVPB 3.375 g  Status:  Discontinued     3.375 g 12.5 mL/hr over 240 Minutes Intravenous 3 times per day 06/17/15 0832 06/17/15 1225   06/17/15 1300  meropenem (MERREM) 1 g in sodium chloride 0.9 % 100 mL IVPB     1 g 200 mL/hr over 30 Minutes Intravenous Every 8 hours 06/17/15 1227     06/17/15 0845  vancomycin (VANCOCIN) 2,000 mg in sodium chloride 0.9 % 500 mL IVPB     2,000 mg 250 mL/hr over 120 Minutes  Intravenous  Once 06/17/15 0829 06/17/15 1205   06/17/15 0845  piperacillin-tazobactam (ZOSYN) IVPB 3.375 g     3.375 g 100 mL/hr over 30 Minutes Intravenous  Once 06/17/15 0832 06/17/15 0947   06/17/15 0830  piperacillin-tazobactam (ZOSYN) IVPB 3.375 g  Status:  Discontinued     3.375 g 12.5 mL/hr over 240 Minutes Intravenous 3 times per day 06/17/15 [id number] 06/17/15 [id number]       Assessment/Plan Perforated lesser curve gastric ulcer with purulent contamination of the abdominal cavity POD #21 s/p Ex Lap with omental patch repair, drain placement Intraabdominal fluid collections concerning for abscess -IR drain in place with serous drainage today, cont for now-Drain output 6675mL/24hr -IVF, pain control, antiemetics (Vanc discontinued after 7 days HCAP, Meropenem - for intraabdominal infection Day #9) Having BM's and flatus. Cultures showed no growth to date. -Advance to soft diet, and breeze.   -Ambulate and IS -CT showed colitis and continued fluid collections around the stomach (show decrease), but resolution of fluid collection around the IR drain  PCM - wean TPN Bipolar -Oral meds -Appreciate psych consult -This had a large effect  on her compliance with medical care during her last admission Hypokalemia -This was a chronic issue on last admission -Normal today Anemia of chronic disease -To follow up as an outpatient LE Edema - 3rd spacing Pleural Effusion/HCAP? - finished 7 day course for HCAP DVT proph - SCD's and lovenox ( )     LOS: 7 days    Nonie Hoyer 06/25/2015, 10:19 AM Pager: [phone]

## 2015-06-25 NOTE — Progress Notes (Signed)
Patient ID: Anita Villarreal, female   DOB: 09-24-1970, 44 y.o.   MRN: [id number]          Subjective:  Pt feeling better; denies sig abd pain,N/V at present; has been OOB  Allergies: Erythromycin and Sulfa antibiotics  Medications: Prior to Admission medications   Medication Sig Start Date End Date Taking? Authorizing Provider  albuterol (PROVENTIL HFA;VENTOLIN HFA) 108 (90 BASE) MCG/ACT inhaler Inhale 2 puffs into the lungs every 6 (six) hours as needed. Wheezing   Yes Historical Provider, MD  albuterol (PROVENTIL) (2.5 MG/3ML) 0.083% nebulizer solution Take 2.5 mg by nebulization every 6 (six) hours as needed for wheezing.   Yes Historical Provider, MD  budesonide-formoterol (SYMBICORT) 80-4.5 MCG/ACT inhaler Inhale 2 puffs into the lungs 2 (two) times daily.   Yes Historical Provider, MD  dicyclomine (BENTYL) 20 MG tablet Take 20 mg by mouth 4 (four) times daily.   Yes Historical Provider, MD  gabapentin (NEURONTIN) 100 MG capsule Take 100 mg by mouth 2 (two) times daily.   Yes Historical Provider, MD  hydrOXYzine (ATARAX/VISTARIL) 50 MG tablet Take 50 mg by mouth at bedtime.   Yes Historical Provider, MD  levofloxacin (LEVAQUIN) 750 MG tablet Take 1 tablet (750 mg total) by mouth daily. 06/15/15  Yes Freeman Caldron, PA-C  lithium carbonate 300 MG capsule Take 300 mg by mouth 2 (two) times daily.   Yes Historical Provider, MD  methocarbamol (ROBAXIN) 500 MG tablet Take 2 tablets (1,000 mg total) by mouth every 8 (eight) hours as needed for muscle spasms (or pain). 06/15/15  Yes Freeman Caldron, PA-C  oxyCODONE-acetaminophen (PERCOCET) 7.5-325 MG tablet Take 1-2 tablets by mouth every 4 (four) hours as needed. 06/15/15  Yes Freeman Caldron, PA-C  pantoprazole (PROTONIX) 40 MG tablet Take 1 tablet (40 mg total) by mouth 2 (two) times daily before a meal. 06/14/15  Yes Nonie Hoyer, PA-C  promethazine (PHENERGAN) 25 MG tablet Take 25 mg by mouth every 6 (six) hours as needed for  nausea or vomiting.   Yes Historical Provider, MD  SUMAtriptan (IMITREX) 100 MG tablet Take 100 mg by mouth every 2 (two) hours as needed. migraines 12/20/14  Yes Historical Provider, MD  ziprasidone (GEODON) 80 MG capsule Take 80 mg by mouth 2 (two) times daily with a meal.   Yes Historical Provider, MD  famotidine (PEPCID) 10 MG tablet Take 1 tablet (10 mg total) by mouth 2 (two) times daily as needed for heartburn or indigestion. Patient not taking: Reported on 06/17/2015 06/14/15   Nonie Hoyer, PA-C     Vital Signs: BP 110/63 mmHg  Pulse 104  Temp(Src) 98.4 F (36.9 C) (Oral)  Resp 15  Ht  (1.727 m)  Wt 259 lb 7.7 oz (117.7 kg)  BMI 39.46 kg/m2  SpO2 100%  LMP 04/04/2015  Physical Exam left abd drain intact, insertion site ok, NT, output 5 cc today; drain irrigated with minimal return of turbid, beige fluid; fluid cx's neg  Imaging: Ct Abdomen Pelvis W Contrast  06/24/2015  CLINICAL DATA:  Recent perforated gastric ulcer status post exploratory laparotomy and closure with patch drain placement with progressively worsening abdominal pain EXAM: CT ABDOMEN AND PELVIS WITH CONTRAST TECHNIQUE: Multidetector CT imaging of the abdomen and pelvis was performed using the standard protocol following bolus administration of intravenous contrast. CONTRAST:  OMNIPAQUE IOHEXOL 300 MG/ML  SOLN COMPARISON:  06/17/2015 FINDINGS: Lower chest: Stable small to moderate right pleural effusion with underlying compressive atelectasis. Hepatobiliary:  Prior cholecystectomy.  Liver appears normal. Pancreas: Normal Spleen: Normal Adrenals/Urinary Tract: Normal Stomach/Bowel: Fluid collection and here it to the posterior wall the stomach again identified measuring 63 x 30 mm, slightly smaller. Inferior to this a second fluid collection now measures 44 x 23 mm, decreased from 47 x 53 mm. The distal half of the ascending colon and the transverse colon as well as the proximal descending colon show mild  mural edema, an appearance which was not previously present. Vascular/Lymphatic: No acute finding Reproductive: Unchanged appearance with cystic tubular structures in both adnexa possibly representing hydrosalpinx. Status post tubal ligation. Other: 2 cm fluid collection in the rectovesical pouch of the pelvis with subtle rim enhancement, slightly larger. Cranial to this, there is another fluid collection with thin rim enhancement measuring 55 x 23 mm as compared to 65 x 27 mm previously. A percutaneous drain has been placed with tip in the left upper quadrant just inside the anterior abdominal wall. The tip is in an area of prior fluid collection adherent to the anterior abdominal wall, which now appears to be essentially resolved. There is a tiny focus of air just cranial to the drainage catheter likely related to the catheter itself. There remain significant inflammatory change within the anterior central and left peritoneum moved at the level of the stomach. Trace ascites along the lateral margin of the liver right lobe, which appears partially loculated. This is similar to the prior study. Trace mildly loculated ascites in the region of the upper splenic hilum. Musculoskeletal: No acute osseous abnormalities. Mild residual body wall edema, significantly improved IMPRESSION: Percutaneous drain left anterior peritoneal cavity has essentially completely drained a fluid collection that was adherent to the left anterior abdominal wall. There remains significant inflammatory change throughout the anterior peritoneal at the level of the stomach. Two fluid collections adjacent to the posterior wall the stomach persists but show decreased size. Trace loculated ascites around the liver and spleen similar to prior study. Loculated fluid collections in the inferior pelvis, 1 of which is slightly smaller and the other is slightly larger when compared to prior study. New fairly extensive colon wall edematous change, a  nonspecific finding. Possibility of infectious colitis not excluded. The appearance is not directly suggestive of pseudomembranous colitis but rather is nonspecific. Electronically Signed   By: Esperanza Heiraymond  Rubner M.D.   On: 06/24/2015 21:53    Labs:  CBC:  Recent Labs  06/19/15 0440 06/20/15 0513 06/21/15 0443 06/24/15 0420  WBC 13.1* 13.7* 12.6* 13.4*  HGB 7.6* 7.5* 8.4* 8.2*  HCT 22.7* 22.4* 24.8* 25.2*  PLT 752* 749* 709* 800*    COAGS:  Recent Labs  06/17/15 1148  INR 1.50*  APTT 41*    BMP:  Recent Labs  06/22/15 0521 06/23/15 0539 06/24/15 0420 06/25/15 0422  NA 137 136 134* 136  K 4.0 4.0 4.1 4.0  CL 105 104 102 102  CO2 23 23 24 25   GLUCOSE 113* 111* 105* 110*  BUN 6 9 9 9   CALCIUM 8.3* 8.6* 8.7* 9.2  CREATININE 0.64 0.63 0.61 0.64  GFRNONAA >60 >60 >60 >60  GFRAA >60 >60 >60 >60    LIVER FUNCTION TESTS:  Recent Labs  06/17/15 0428 06/18/15 0455 06/20/15 0513 06/24/15 0420  BILITOT 0.5 0.6 0.5 0.3  AST 21 24 49* 131*  ALT 13* 14 21 75*  ALKPHOS 142* 138* 137* 199*  PROT 5.8* 6.3* 5.9* 6.7  ALBUMIN 1.7* 1.8* 1.6* 2.0*    Assessment and Plan:  S/p drainage of left perit abscess post gastric ulcer perf 12/12; afebrile; last WBC 13.4, hgb 8.2; f/u CT reviewed- resolved drained collection left abd, sig inflamm change present with persistent fluid collections, one in inf pelvis sl larger, colon wall edematous change; if drained collection has minimal output on 12/21 may be able to remove; other plans as per [facility]   Signed: D. Jeananne Rama 06/25/2015, 5:08 PM   I spent a total of 15 minutes at the the patient's bedside AND on the patient's hospital floor or unit, greater than 50% of which was counseling/coordinating care for peritoneal abscess drain

## 2015-06-26 LAB — BASIC METABOLIC PANEL
ANION GAP: 10 (ref 5–15)
BUN: 9 mg/dL (ref 6–20)
CALCIUM: 8.9 mg/dL (ref 8.9–10.3)
CHLORIDE: 102 mmol/L (ref 101–111)
CO2: 22 mmol/L (ref 22–32)
Creatinine, Ser: 0.58 mg/dL (ref 0.44–1.00)
GFR calc non Af Amer: >60 mL/min (ref >60)
GLUCOSE: 111 mg/dL — AB (ref 65–99)
Potassium: 4 mmol/L (ref 3.5–5.1)
Sodium: 134 mmol/L — ABNORMAL LOW (ref 135–145)

## 2015-06-26 LAB — LITHIUM LEVEL: Lithium Lvl: 0.28 mmol/L — ABNORMAL LOW (ref 0.60–1.20)

## 2015-06-26 LAB — CBC
HEMATOCRIT: 23.2 % — AB (ref 36.0–46.0)
HEMOGLOBIN: 7.8 g/dL — AB (ref 12.0–15.0)
MCH: 29 pg (ref 26.0–34.0)
MCHC: 33.6 g/dL (ref 30.0–36.0)
MCV: 86.2 fL (ref 78.0–100.0)
Platelets: 642 10*3/uL — ABNORMAL HIGH (ref 150–400)
RBC: 2.69 MIL/uL — ABNORMAL LOW (ref 3.87–5.11)
RDW: 17 % — AB (ref 11.5–15.5)
WBC: 10.9 10*3/uL — AB (ref 4.0–10.5)

## 2015-06-26 LAB — TSH: TSH: 6.524 u[IU]/mL — ABNORMAL HIGH (ref 0.350–4.500)

## 2015-06-26 LAB — MAGNESIUM: Magnesium: 1.8 mg/dL (ref 1.7–2.4)

## 2015-06-26 LAB — T4, FREE: FREE T4: 0.83 ng/dL (ref 0.61–1.12)

## 2015-06-26 LAB — GLUCOSE, CAPILLARY
GLUCOSE-CAPILLARY: 98 mg/dL (ref 65–99)
GLUCOSE-CAPILLARY: 108 mg/dL — AB (ref 65–99)

## 2015-06-26 LAB — PHOSPHORUS: PHOSPHORUS: 4 mg/dL (ref 2.5–4.6)

## 2015-06-26 MED ORDER — ENSURE ENLIVE PO LIQD
237.0000 mL | Freq: Two times a day (BID) | ORAL | Status: DC
  Administered 2015-06-27 – 2015-06-29 (×4): 237 mL via ORAL

## 2015-06-26 MED ORDER — OXYCODONE-ACETAMINOPHEN 7.5-325 MG PO TABS
1.0000 | ORAL_TABLET | ORAL | Status: DC | PRN
  Administered 2015-06-26 (×3): 2 via ORAL
  Administered 2015-06-26: 1 via ORAL
  Administered 2015-06-27 – 2015-06-29 (×9): 2 via ORAL
  Filled 2015-06-26 (×14): qty 2

## 2015-06-26 MED ORDER — METHOCARBAMOL 500 MG PO TABS
1000.0000 mg | ORAL_TABLET | Freq: Three times a day (TID) | ORAL | Status: DC | PRN
  Administered 2015-06-26: 1000 mg via ORAL
  Filled 2015-06-26 (×2): qty 2

## 2015-06-26 MED ORDER — AMOXICILLIN-POT CLAVULANATE 875-125 MG PO TABS
1.0000 | ORAL_TABLET | Freq: Two times a day (BID) | ORAL | Status: DC
  Administered 2015-06-26 – 2015-06-29 (×7): 1 via ORAL
  Filled 2015-06-26 (×7): qty 1

## 2015-06-26 MED ORDER — ALTEPLASE 2 MG IJ SOLR
2.0000 mg | Freq: Once | INTRAMUSCULAR | Status: AC
  Administered 2015-06-26: 2 mg
  Filled 2015-06-26: qty 2

## 2015-06-26 MED ORDER — PANTOPRAZOLE SODIUM 40 MG PO TBEC
40.0000 mg | DELAYED_RELEASE_TABLET | Freq: Two times a day (BID) | ORAL | Status: DC
  Administered 2015-06-26 – 2015-06-29 (×6): 40 mg via ORAL
  Filled 2015-06-26 (×6): qty 1

## 2015-06-26 MED ORDER — FAMOTIDINE 20 MG PO TABS
20.0000 mg | ORAL_TABLET | Freq: Two times a day (BID) | ORAL | Status: DC
  Administered 2015-06-26 – 2015-06-29 (×7): 20 mg via ORAL
  Filled 2015-06-26 (×7): qty 1

## 2015-06-26 NOTE — Progress Notes (Signed)
[facility] Progress Note     Subjective: Pt feels good, no pain or bloating.  Good BM's and flatus.  Ambulating well.  Drain with minimal output 6025mL/25hr and serous.  No CP/SOB, no fevers, WBC is the lowest its been.  Tolerated soft diet for >24hr.  Talked to her husband on the phone today and gave him an update.   Objective: Vital signs in last 24 hours: Temp:  [98.4 F (36.9 C)-99 F (37.2 C)] 98.6 F (37 C) (12/21 0500) Pulse Rate:  [104-111] 111 (12/21 0500) Resp:  [15-16] 16 (12/21 0500) BP: (110-111)/(62-70) 111/70 mmHg (12/21 0500) SpO2:  [96 %-100 %] 96 % (12/21 0500) Last BM Date: 06/24/15  Intake/Output from previous day: 12/20 0701 - 12/21 0700 In: 1525 [P.O.:800; TPN:720] Out: 2875 [Urine:2850; Drains:25] Intake/Output this shift:    PE: Gen:  Alert, NAD, pleasant Card:  RRR, no M/G/R heard Pulm:  CTA, no W/R/R Abd: Obese, soft, NT/ND, +BS, no HSM, wound vac in place with minimal serosanguinous drainage Ext:  No erythema, or tenderness, b/l edema minimally improved  Lab Results:   Recent Labs  06/24/15 0420 06/26/15 0606  WBC 13.4* 10.9*  HGB 8.2* 7.8*  HCT 25.2* 23.2*  PLT 800* 642*   BMET  Recent Labs  06/25/15 0422 06/26/15 0606  NA 136 134*  K 4.0 4.0  CL 102 102  CO2 25 22  GLUCOSE 110* 111*  BUN 9 9  CREATININE 0.64 0.58  CALCIUM 9.2 8.9   PT/INR No results for input(s): LABPROT, INR in the last 72 hours. CMP     Component Value Date/Time   NA 134* 06/26/2015 0606   K 4.0 06/26/2015 0606   CL 102 06/26/2015 0606   CO2 22 06/26/2015 0606   GLUCOSE 111* 06/26/2015 0606   BUN 9 06/26/2015 0606   CREATININE 0.58 06/26/2015 0606   CALCIUM 8.9 06/26/2015 0606   PROT 6.7 06/24/2015 0420   ALBUMIN 2.0* 06/24/2015 0420   AST 131* 06/24/2015 0420   ALT 75* 06/24/2015 0420   ALKPHOS 199* 06/24/2015 0420   BILITOT 0.3 06/24/2015 0420   GFRNONAA >60 06/26/2015 0606   GFRAA >60 06/26/2015 0606   Lipase      Component Value Date/Time   LIPASE 36 06/17/2015 0428       Studies/Results: Ct Abdomen Pelvis W Contrast  06/24/2015  CLINICAL DATA:  Recent perforated gastric ulcer status post exploratory laparotomy and closure with patch drain placement with progressively worsening abdominal pain EXAM: CT ABDOMEN AND PELVIS WITH CONTRAST TECHNIQUE: Multidetector CT imaging of the abdomen and pelvis was performed using the standard protocol following bolus administration of intravenous contrast. CONTRAST:  100mL OMNIPAQUE IOHEXOL 300 MG/ML  SOLN COMPARISON:  06/17/2015 FINDINGS: Lower chest: Stable small to moderate right pleural effusion with underlying compressive atelectasis. Hepatobiliary: Prior cholecystectomy.  Liver appears normal. Pancreas: Normal Spleen: Normal Adrenals/Urinary Tract: Normal Stomach/Bowel: Fluid collection and here it to the posterior wall the stomach again identified measuring 63 x 30 mm, slightly smaller. Inferior to this a second fluid collection now measures 44 x 23 mm, decreased from 47 x 53 mm. The distal half of the ascending colon and the transverse colon as well as the proximal descending colon show mild mural edema, an appearance which was not previously present. Vascular/Lymphatic: No acute finding Reproductive: Unchanged appearance with cystic tubular structures in both adnexa possibly representing hydrosalpinx. Status post tubal ligation. Other: 2 cm fluid collection in the rectovesical pouch of the pelvis with  subtle rim enhancement, slightly larger. Cranial to this, there is another fluid collection with thin rim enhancement measuring 55 x 23 mm as compared to 65 x 27 mm previously. A percutaneous drain has been placed with tip in the left upper quadrant just inside the anterior abdominal wall. The tip is in an area of prior fluid collection adherent to the anterior abdominal wall, which now appears to be essentially resolved. There is a tiny focus of air just cranial to the  drainage catheter likely related to the catheter itself. There remain significant inflammatory change within the anterior central and left peritoneum moved at the level of the stomach. Trace ascites along the lateral margin of the liver right lobe, which appears partially loculated. This is similar to the prior study. Trace mildly loculated ascites in the region of the upper splenic hilum. Musculoskeletal: No acute osseous abnormalities. Mild residual body wall edema, significantly improved IMPRESSION: Percutaneous drain left anterior peritoneal cavity has essentially completely drained a fluid collection that was adherent to the left anterior abdominal wall. There remains significant inflammatory change throughout the anterior peritoneal at the level of the stomach. Two fluid collections adjacent to the posterior wall the stomach persists but show decreased size. Trace loculated ascites around the liver and spleen similar to prior study. Loculated fluid collections in the inferior pelvis, 1 of which is slightly smaller and the other is slightly larger when compared to prior study. New fairly extensive colon wall edematous change, a nonspecific finding. Possibility of infectious colitis not excluded. The appearance is not directly suggestive of pseudomembranous colitis but rather is nonspecific. Electronically Signed   By: Esperanza Heir M.D.   On: 06/24/2015 21:53    Anti-infectives: Anti-infectives    Start     Dose/Rate Route Frequency Ordered Stop   06/17/15 1700  vancomycin (VANCOCIN) IVPB 1000 mg/200 mL premix  Status:  Discontinued     1,000 mg 200 mL/hr over 60 Minutes Intravenous Every 8 hours 06/17/15 0829 06/23/15 1314   06/17/15 1700  piperacillin-tazobactam (ZOSYN) IVPB 3.375 g  Status:  Discontinued     3.375 g 12.5 mL/hr over 240 Minutes Intravenous 3 times per day 06/17/15 0832 06/17/15 1225   06/17/15 1300  meropenem (MERREM) 1 g in sodium chloride 0.9 % 100 mL IVPB     1 g 200 mL/hr  over 30 Minutes Intravenous Every 8 hours 06/17/15 1227     06/17/15 0845  vancomycin (VANCOCIN) 2,000 mg in sodium chloride 0.9 % 500 mL IVPB     2,000 mg 250 mL/hr over 120 Minutes Intravenous  Once 06/17/15 0829 06/17/15 1205   06/17/15 0845  piperacillin-tazobactam (ZOSYN) IVPB 3.375 g     3.375 g 100 mL/hr over 30 Minutes Intravenous  Once 06/17/15 0832 06/17/15 0947   06/17/15 0830  piperacillin-tazobactam (ZOSYN) IVPB 3.375 g  Status:  Discontinued     3.375 g 12.5 mL/hr over 240 Minutes Intravenous 3 times per day 06/17/15 0829 06/17/15 7829       Assessment/Plan Perforated lesser curve gastric ulcer with purulent contamination of the abdominal cavity POD #22 s/p Ex Lap with omental patch repair, drain placement Intraabdominal fluid collections concerning for abscess -IR drain in place with serous drainage today, cont for now-Drain output 23mL/24hr - Okay with [place] to remove IR drain -IVF, pain control, antiemetics (Vanc discontinued after 7 days HCAP, Meropenem - for intraabdominal infection Day #10) Having BM's and flatus. Cultures showed no growth to date.  Will consider continuing antibiotics at  discharge for 1 more week - Augmentin? -Tolerating soft diet and breeze.  -Protonix, pepcid -Ambulate and IS -CT (06/24/15) showed colitis and continued fluid collections around the stomach (show decrease), but resolution of fluid collection around the IR drain -Has follow up scheduled with Dr. Luisa Hart -Will decide at dressing change if wound vac can be discontinued and WD dressings started  PCM - Improved prealb 12.8, wean TPN Bipolar -Oral meds -Appreciate psych consult -This had a large effect on her compliance with medical care during her last admission Hypothyroidism -Psych ordered TSH, TSH was found to be 6.5, will call medicine back to address this Hypokalemia -This was a chronic issue on last admission -Normal today Anemia of chronic disease -To follow up as an  outpatient -Hgb has been between 7.8 and 8.2 over last several days.  Will have patient get CBC rechecked prior to her appointment with Dr. Luisa Hart to make sure its improving.   LE Edema - 3rd spacing Pleural Effusion/HCAP? - finished 7 day course for HCAP DVT proph - SCD's and lovenox ( ) Disp - d/c later today or tomorrow.  She will follow up with her PCP at discharge.    LOS: 8 days    Anita Villarreal 06/26/2015, 7:49 AM Pager: [phone]

## 2015-06-26 NOTE — Care Management Note (Signed)
Case Management Note  Patient Details  Name: Anita BellMelanie Villarreal MRN: [id number] Date of Birth: 06/18/1971  Subjective/Objective:                    Action/Plan:   Expected Discharge Date:                  Expected Discharge Plan:  Home w Home Health Services  In-House Referral:     Discharge planning Services     Post Acute Care Choice:    Choice offered to:  Patient  DME Arranged:    DME Agency:     HH Arranged:  RN HH Agency:  [facility]  Status of Service:  Completed, signed off  Medicare Important Message Given:    Date Medicare IM Given:    Medicare IM give by:    Date Additional Medicare IM Given:    Additional Medicare Important Message give by:     If discussed at Long Length of Stay Meetings, dates discussed:    Additional Comments:  [name], [name], RN 06/26/2015, 10:33 AM

## 2015-06-26 NOTE — Progress Notes (Signed)
Nutrition Follow-up  DOCUMENTATION CODES:   Obesity unspecified  INTERVENTION:   -D/c Boost Breeze, due to poor acceptance -Ensure Enlive po BID, each supplement provides 350 kcal and 20 grams of protein  NUTRITION DIAGNOSIS:   Inadequate oral intake related to wound healing as evidenced by estimated needs.  Progressing  GOAL:   Patient will meet greater than or equal to 90% of their needs  Progressng  MONITOR:   Diet advancement, Labs, Weight trends, Skin, I & O's  REASON FOR ASSESSMENT:   Consult Assessment of nutrition requirement/status  ASSESSMENT:   Anita Villarreal is a 44 y.o. female with a Past Medical History of bipolar1, GERD, tobacco use, gastric ulcers and perforations status post laparotomy who presents with persistent worsening right pleural effusion and numerous abdominal seromas/abscesses and persistent gastric perforation  RD received consult for supplement recommendations.   Pt speaking with MD at time of visit. Spoke with RN, who reports that PICC line has been pulled and TPN has been d/c'd. RN reports fair appetite (meal completion 20-40%) on a soft diet. Per RN, pt is not accepting the [employer] Breeze supplement. Will d/c due to poor acceptance. Per MD notes, plan to d/c wound vac today as well.   Per RN, plan to d/c either today or tomorrow.   Diet Order:  DIET SOFT Room service appropriate?: Yes; Fluid consistency:: Thin  Skin:  Wound (see comment) (abdominal vac)  Last BM:  06/24/15  Height:   Ht Readings from Last 1 Encounters:  06/17/15 5\' 8"  (1.727 m)    Weight:   Wt Readings from Last 1 Encounters:  06/17/15 259 lb 7.7 oz (117.7 kg)    Ideal Body Weight:  63.6 kg  BMI:  Body mass index is 39.46 kg/(m^2).  Estimated Nutritional Needs:   Kcal:  2100-2300  Protein:  115-130 grams  Fluid:  >2.1 L  EDUCATION NEEDS:   No education needs identified at this time  Jessyca Sloan A. Mayford KnifeWilliams, RD, LDN, CDE Pager: [phone] After  hours Pager: [phone]

## 2015-06-26 NOTE — Progress Notes (Signed)
Patient ID: Anita Villarreal, female   DOB: [birth year], 44 y.o.   MRN: [id number]          Subjective:  Pt feeling better; denies sig abd pain,N/V at present; OOB sitting in chair at present Surgery note reviewed  Allergies: Erythromycin and Sulfa antibiotics  Medications:  Current facility-administered medications:  .  albuterol (PROVENTIL) (2.5 MG/3ML) 0.083% nebulizer solution 2.5 mg, 2.5 mg, Nebulization, Q6H PRN, Nonie Hoyer, PA-C .  alum & mag hydroxide-simeth (MAALOX/MYLANTA) 200-200-20 MG/5ML suspension 30 mL, 30 mL, Oral, Q4H PRN, Hollice Espy, MD, 30 mL at 06/23/15 1651 .  amoxicillin-clavulanate (AUGMENTIN) 875-125 MG per tablet 1 tablet, 1 tablet, Oral, Q12H, Nonie Hoyer, PA-C, 1 tablet at 06/26/15 1418 .  antiseptic oral rinse (CPC / CETYLPYRIDINIUM CHLORIDE 0.05%) solution 7 mL, 7 mL, Mouth Rinse, q12n4p, Harriette Bouillon, MD, 7 mL at 06/24/15 1359 .  budesonide-formoterol (SYMBICORT) 80-4.5 MCG/ACT inhaler 2 puff, 2 puff, Inhalation, BID, Nonie Hoyer, PA-C, 2 puff at 06/26/15 4098 .  chlorhexidine (PERIDEX) 0.12 % solution 15 mL, 15 mL, Mouth Rinse, BID, Harriette Bouillon, MD, 15 mL at 06/25/15 0934 .  diphenhydrAMINE (BENADRYL) capsule 25 mg, 25 mg, Oral, Q6H PRN, Rolan Lipa, NP, 25 mg at 06/24/15 0425 .  enoxaparin (LOVENOX) injection 60 mg, 0.5 mg/kg, Subcutaneous, Daily, Barnetta Chapel, PA-C, 60 mg at 06/26/15 1191 .  famotidine (PEPCID) tablet 20 mg, 20 mg, Oral, BID, Megan N Baird, PA-C, 20 mg at 06/26/15 1418 .  feeding supplement (BOOST / RESOURCE BREEZE) liquid 1 Container, 1 Container, Oral, TID BM, Nonie Hoyer, PA-C, 1 Container at 06/26/15 1418 .  hydrOXYzine (ATARAX/VISTARIL) tablet 50 mg, 50 mg, Oral, QHS, Barnetta Chapel, PA-C, 50 mg at 06/25/15 2155 .  lithium carbonate capsule 300 mg, 300 mg, Oral, BID, Barnetta Chapel, PA-C, 300 mg at 06/26/15 0936 .  methocarbamol (ROBAXIN) tablet 1,000 mg, 1,000 mg, Oral, Q8H PRN, Nonie Hoyer,  PA-C, 1,000 mg at 06/26/15 1335 .  oxyCODONE-acetaminophen (PERCOCET) 7.5-325 MG per tablet 1-2 tablet, 1-2 tablet, Oral, Q4H PRN, Nonie Hoyer, PA-C, 2 tablet at 06/26/15 1335 .  pantoprazole (PROTONIX) EC tablet 40 mg, 40 mg, Oral, BID AC, Megan N Baird, PA-C .  potassium chloride 20 MEQ/15ML (10%) solution 40 mEq, 40 mEq, Oral, BID, Hollice Espy, MD, 40 mEq at 06/21/15 2120 .  sodium chloride 0.9 % injection 10-40 mL, 10-40 mL, Intracatheter, PRN, Leata Mouse, MD, 10 mL at 06/26/15 0902 .  ziprasidone (GEODON) capsule 80 mg, 80 mg, Oral, BID WC, Barnetta Chapel, PA-C, 80 mg at 06/26/15 0755    Vital Signs: BP 109/74 mmHg  Pulse 102  Temp(Src) 98.3 F (36.8 C) (Oral)  Resp 16  Ht  (1.727 m)  Wt 259 lb 7.7 oz (117.7 kg)  BMI 39.46 kg/m2  SpO2 100%  LMP 04/04/2015  Physical Exam left abd drain intact, insertion site ok, NT,  Scant output in drain line  Imaging: Ct Abdomen Pelvis W Contrast  06/24/2015  CLINICAL DATA:  Recent perforated gastric ulcer status post exploratory laparotomy and closure with patch drain placement with progressively worsening abdominal pain EXAM: CT ABDOMEN AND PELVIS WITH CONTRAST TECHNIQUE: Multidetector CT imaging of the abdomen and pelvis was performed using the standard protocol following bolus administration of intravenous contrast. CONTRAST:  OMNIPAQUE IOHEXOL 300 MG/ML  SOLN COMPARISON:  06/17/2015 FINDINGS: Lower chest: Stable small to moderate right pleural effusion with underlying compressive atelectasis. Hepatobiliary: Prior cholecystectomy.  Liver appears  normal. Pancreas: Normal Spleen: Normal Adrenals/Urinary Tract: Normal Stomach/Bowel: Fluid collection and here it to the posterior wall the stomach again identified measuring 63 x 30 mm, slightly smaller. Inferior to this a second fluid collection now measures 44 x 23 mm, decreased from 47 x 53 mm. The distal half of the ascending colon and the transverse colon as well as  the proximal descending colon show mild mural edema, an appearance which was not previously present. Vascular/Lymphatic: No acute finding Reproductive: Unchanged appearance with cystic tubular structures in both adnexa possibly representing hydrosalpinx. Status post tubal ligation. Other: 2 cm fluid collection in the rectovesical pouch of the pelvis with subtle rim enhancement, slightly larger. Cranial to this, there is another fluid collection with thin rim enhancement measuring 55 x 23 mm as compared to 65 x 27 mm previously. A percutaneous drain has been placed with tip in the left upper quadrant just inside the anterior abdominal wall. The tip is in an area of prior fluid collection adherent to the anterior abdominal wall, which now appears to be essentially resolved. There is a tiny focus of air just cranial to the drainage catheter likely related to the catheter itself. There remain significant inflammatory change within the anterior central and left peritoneum moved at the level of the stomach. Trace ascites along the lateral margin of the liver right lobe, which appears partially loculated. This is similar to the prior study. Trace mildly loculated ascites in the region of the upper splenic hilum. Musculoskeletal: No acute osseous abnormalities. Mild residual body wall edema, significantly improved IMPRESSION: Percutaneous drain left anterior peritoneal cavity has essentially completely drained a fluid collection that was adherent to the left anterior abdominal wall. There remains significant inflammatory change throughout the anterior peritoneal at the level of the stomach. Two fluid collections adjacent to the posterior wall the stomach persists but show decreased size. Trace loculated ascites around the liver and spleen similar to prior study. Loculated fluid collections in the inferior pelvis, 1 of which is slightly smaller and the other is slightly larger when compared to prior study. New fairly  extensive colon wall edematous change, a nonspecific finding. Possibility of infectious colitis not excluded. The appearance is not directly suggestive of pseudomembranous colitis but rather is nonspecific. Electronically Signed   By: Esperanza Heiraymond  Rubner M.D.   On: 06/24/2015 21:53    Labs:  CBC:  Recent Labs  06/20/15 0513 06/21/15 0443 06/24/15 0420 06/26/15 0606  WBC 13.7* 12.6* 13.4* 10.9*  HGB 7.5* 8.4* 8.2* 7.8*  HCT 22.4* 24.8* 25.2* 23.2*  PLT 749* 709* 800* 642*    COAGS:  Recent Labs  06/17/15 1148  INR 1.50*  APTT 41*    BMP:  Recent Labs  06/23/15 0539 06/24/15 0420 06/25/15 0422 06/26/15 0606  NA 136 134* 136 134*  K 4.0 4.1 4.0 4.0  CL 104 102 102 102  CO2 23 24 25 22   GLUCOSE 111* 105* 110* 111*  BUN 9 9 9 9   CALCIUM 8.6* 8.7* 9.2 8.9  CREATININE 0.63 0.61 0.64 0.58  GFRNONAA >60 >60 >60 >60  GFRAA >60 >60 >60 >60    LIVER FUNCTION TESTS:  Recent Labs  06/17/15 0428 06/18/15 0455 06/20/15 0513 06/24/15 0420  BILITOT 0.5 0.6 0.5 0.3  AST 21 24 49* 131*  ALT 13* 14 21 75*  ALKPHOS 142* 138* 137* 199*  PROT 5.8* 6.3* 5.9* 6.7  ALBUMIN 1.7* 1.8* 1.6* 2.0*    Assessment and Plan: S/p drainage of left perit  abscess post gastric ulcer perf 12/12; afebrile; WBC cont to trend down, 10.9 today f/u CT reviewed- resolved drained collection left abd, sig inflamm change present with persistent fluid collections, one in inf pelvis sl larger, colon wall edematous change; Drain removed at bedside without difficulty   Signed: Brayton El 06/26/2015, 2:58 PM   I spent a total of 15 minutes at the the patient's bedside AND on the patient's hospital floor or unit, greater than 50% of which was counseling/coordinating care for peritoneal abscess drain

## 2015-06-26 NOTE — Progress Notes (Signed)
PARENTERAL NUTRITION CONSULT NOTE -Follow-up  Pharmacy Consult for TPN Indication: Gastric perforation  Allergies  Allergen Reactions  . Erythromycin Other (See Comments)    Tongue swelling   . Sulfa Antibiotics     swelling    Patient Measurements: Height: 5' 8" (172.7 cm) Weight: 259 lb 7.7 oz (117.7 kg) IBW/kg (Calculated) : 63.9 Adjusted Body Weight: 77 kg  Vital Signs: Temp: 98.6 F (37 C) (12/21 0500) Temp Source: Oral (12/21 0500) BP: 111/70 mmHg (12/21 0500) Pulse Rate: 111 (12/21 0500)  Labs:  Recent Labs  06/24/15 0420 06/26/15 0606  WBC 13.4* 10.9*  HGB 8.2* 7.8*  HCT 25.2* 23.2*  PLT 800* 642*     Recent Labs  06/24/15 0420 06/25/15 0422 06/26/15 0606  NA 134* 136 134*  K 4.1 4.0 4.0  CL 102 102 102  CO2 24 25 22  GLUCOSE 105* 110* 111*  BUN 9 9 9  CREATININE 0.61 0.64 0.58  CALCIUM 8.7* 9.2 8.9  MG 1.9 1.9 1.8  PHOS 4.3 3.9 4.0  PROT 6.7  --   --   ALBUMIN 2.0*  --   --   AST 131*  --   --   ALT 75*  --   --   ALKPHOS 199*  --   --   BILITOT 0.3  --   --   PREALBUMIN 12.8*  --   --   TRIG 125  --   --    Estimated Creatinine Clearance: 118.5 mL/min (by C-G formula based on Cr of 0.58).    Recent Labs  06/25/15 1143 06/25/15 1700 06/25/15 2311  GLUCAP 115* 115* 98   Insulin Requirements in the past 24 hours:  0 units SSI- discontinued  Assessment: 44 year old female with recent history gastric perforation s/p exlap and patch repair of gastric ulcer perforation on 11/29. Patient was discharged on 12/10 with soft oral diet. Now re-admitted 12/12 with progressive worsening of abdominal pain, chills, nausea, anorexia, and loose stools. CT shows defect of the lesser curve of the stomach with air bubbles concerning for perforated gastric ulcer, abscess or post-operative seroma.   Surgeries/Procedures: 12/12: Perc abd drain in IR - 20mL purulent fluid aspirated from abscess  GI: GERD, gastric ulcers, perf -abd drain output  25mL/24hr- serous.  Regular BMs, BS and flatus. Healing well and possible plan to d/c wound vac on 12/21. Advancing oral diet slowly and currently on a full liquid diet. Spoke with patient 12/20 - she feels that she is not hungry and has reflux, no nausea. Increased intake noted on 12/20. Advanced to soft diet and discontinuing TPN and discharging today or tomorrow. Prealbumin improved at 12.8 Endo: CBGs controlled, insulin discontinued. TSH elevated- 6.524.  Lytes: K 4 - KCl 40mEq BID but pt is refusing (no doses given). Phos 4, Mag 1.8, CorCa ~10.7 Renal: SCr 0.64, CrCl >100mL/min- stable. I/O: 595/1260.  Pulm: R-pleural effusion- no plan for thoracentesis at this time, doing well on RA - symbicort  Cards: BP good, HR tachy. 12/13 ECHO- EF 55-60%.  No meds Hepatobil: AST and ALT increased again to 131 and 75, respectively. Alk phos trending back up to 199.Tbili wnl. Trigs 125 Neuro: Bipolar disorder- denies hallucinations, mania, anxiety, and other symptoms. On Geodon and lithium. Lithium level to be checked 12/21. Dilaudid prn- using every 4 hours. ID: HCAP - vancomycin completed + intraabd infxn (abx D#10). WBC down 10.9, afebrile. Plan for abx x1 more week.  12/12 Blood >> NEG 12/12 Abscess   cx >> NEG  12/12 Vanc >> 12/18      VT 12/14 = 15 on 1g IV q8h -at goal at Css, drawn correctly.  12/12 Merrem >>  Best Practices: Lovenox, MC   TPN Access: PICC placement 12/13 TPN start date: 12/13 >>   Current Nutrition:  Clinimix E 5/15 at 50mL/hr + lipids at 10 mL/hr - provides 60g protein and 1332kcal/day Soft diet + breeze- 20-40% intake; d/c TPN per surgery  Nutritional Goals:  Per RD recommendations on 06/21/15:  2100-2300 KCal/day 115-130 g protein/day Fluid >2.1L  Plan:  - Decrease Clinimix E 5/15 to 25mL/hr + lipids at 10 mL/hr x2 hours and then discontinue per surgery.  - Discontinue TPN labs, nursing orders, and consults.    , PharmD, BCPS Clinical  Pharmacist [phone]  06/26/2015 8:22 AM  

## 2015-06-27 DIAGNOSIS — K567 Ileus, unspecified: Secondary | ICD-10-CM | POA: Diagnosis present

## 2015-06-27 DIAGNOSIS — K9189 Other postprocedural complications and disorders of digestive system: Secondary | ICD-10-CM | POA: Diagnosis present

## 2015-06-27 DIAGNOSIS — D638 Anemia in other chronic diseases classified elsewhere: Secondary | ICD-10-CM | POA: Diagnosis present

## 2015-06-27 LAB — CBC
HEMATOCRIT: 24.8 % — AB (ref 36.0–46.0)
HEMOGLOBIN: 8 g/dL — AB (ref 12.0–15.0)
MCH: 27.9 pg (ref 26.0–34.0)
MCHC: 32.3 g/dL (ref 30.0–36.0)
MCV: 86.4 fL (ref 78.0–100.0)
Platelets: 676 10*3/uL — ABNORMAL HIGH (ref 150–400)
RBC: 2.87 MIL/uL — ABNORMAL LOW (ref 3.87–5.11)
RDW: 16.5 % — AB (ref 11.5–15.5)
WBC: 13.5 10*3/uL — ABNORMAL HIGH (ref 4.0–10.5)

## 2015-06-27 MED ORDER — PANTOPRAZOLE SODIUM 40 MG PO TBEC: 40.0000 mg | DELAYED_RELEASE_TABLET | Freq: Two times a day (BID) | ORAL | Status: DC

## 2015-06-27 MED ORDER — LEVOTHYROXINE SODIUM 25 MCG PO TABS
25.0000 ug | ORAL_TABLET | Freq: Every day | ORAL | Status: DC
  Administered 2015-06-28 – 2015-06-29 (×2): 25 ug via ORAL
  Filled 2015-06-27 (×2): qty 1

## 2015-06-27 MED ORDER — AMOXICILLIN-POT CLAVULANATE 875-125 MG PO TABS: 1.0000 | ORAL_TABLET | Freq: Two times a day (BID) | ORAL | Status: DC

## 2015-06-27 MED ORDER — OXYCODONE-ACETAMINOPHEN 7.5-325 MG PO TABS: 1.0000 | ORAL_TABLET | Freq: Four times a day (QID) | ORAL | Status: DC | PRN

## 2015-06-27 MED ORDER — LEVOTHYROXINE SODIUM 25 MCG PO TABS: 25.0000 ug | ORAL_TABLET | Freq: Every day | ORAL | Status: DC

## 2015-06-27 MED ORDER — FAMOTIDINE 20 MG PO TABS: 20.0000 mg | ORAL_TABLET | Freq: Two times a day (BID) | ORAL | Status: DC

## 2015-06-27 MED ORDER — METHOCARBAMOL 500 MG PO TABS: 1000.0000 mg | ORAL_TABLET | Freq: Three times a day (TID) | ORAL | Status: DC | PRN

## 2015-06-27 MED ORDER — ENSURE ENLIVE PO LIQD: 237.0000 mL | Freq: Two times a day (BID) | ORAL | Status: DC

## 2015-06-27 NOTE — Progress Notes (Signed)
Surgery consultant back regarding her elevated TSH and normal free T4.  patient is on lithium and likely will continue this for a long time. given her modest elevation in TSH  I would recommend starting her on low-dose Synthroid. I discussed recommendations with surgery PA Jorje GuildMegan Baird. Patient will need repeat TSH and free T4 checked in 3 months. Lithium dose can be adjusted by psychiatry as outpatient. (Has been subtherapeutic). I also ordered thyroid peroxidase antibodies which can be followed by primary or as outpatient.

## 2015-06-27 NOTE — Discharge Instructions (Signed)
Go to [facility] lab to have your blood redrawn to check your blood counts.  Get your lab drawn on 07/14/14 at least 45 min before your appointment with Dr. Luisa Hartornett at 2:20pm on 07/14/14.      [facility]      [facility], [place] [phone]  OPEN ABDOMINAL SURGERY: POST OP INSTRUCTIONS  Always review your discharge instruction sheet given to you by the facility where your surgery was performed.  IF YOU HAVE DISABILITY OR FAMILY LEAVE FORMS, YOU MUST BRING THEM TO THE OFFICE FOR PROCESSING.  PLEASE DO NOT GIVE THEM TO YOUR DOCTOR.  1. A prescription for pain medication may be given to you upon discharge.  Take your pain medication as prescribed, if needed.  If narcotic pain medicine is not needed, then you may take acetaminophen (Tylenol) or ibuprofen (Advil) as needed. 2. Take your usually prescribed medications unless otherwise directed. 3. If you need a refill on your pain medication, please contact your pharmacy. They will contact our office to request authorization.  Prescriptions will not be filled after 5pm or on week-ends. 4. You should follow a light diet the first few days after arrival home, such as soup and crackers, pudding, etc.unless your doctor has advised otherwise. A high-fiber, low fat diet can be resumed as tolerated.   Be sure to include lots of fluids daily. Most patients will experience some swelling and bruising on the chest and neck area.  Ice packs will help.  Swelling and bruising can take several days to resolve 5. Most patients will experience some swelling and bruising in the area of the incision. Ice pack will help. Swelling and bruising can take several days to resolve..  6. It is common to experience some constipation if taking pain medication after surgery.  Increasing fluid intake and taking a stool softener will usually help or prevent this problem from occurring.  A mild laxative (Milk of Magnesia or Miralax) should be taken according to package directions if there  are no bowel movements after 48 hours. 7.  You may have steri-strips (small skin tapes) in place directly over the incision.  These strips should be left on the skin for 7-10 days.  If your surgeon used skin glue on the incision, you may shower in 24 hours.  The glue will flake off over the next 2-3 weeks.  Any sutures or staples will be removed at the office during your follow-up visit. You may find that a light gauze bandage over your incision may keep your staples from being rubbed or pulled. You may shower and replace the bandage daily. 8. ACTIVITIES:  You may resume regular (light) daily activities beginning the next day--such as daily self-care, walking, climbing stairs--gradually increasing activities as tolerated.  You may have sexual intercourse when it is comfortable.  Refrain from any heavy lifting or straining until approved by your doctor. a. You may drive when you no longer are taking prescription pain medication, you can comfortably wear a seatbelt, and you can safely maneuver your car and apply brakes b. Return to Work: ___________________________________ 9. You should see your doctor in the office for a follow-up appointment approximately two weeks after your surgery.  Make sure that you call for this appointment within a day or two after you arrive home to insure a convenient appointment time. OTHER INSTRUCTIONS:  _____________________________________________________________ _____________________________________________________________  WHEN TO CALL YOUR DOCTOR: 1. Fever over 101.0 2. Inability to urinate 3. Nausea and/or vomiting 4. Extreme swelling or bruising 5. Continued  bleeding from incision. 6. Increased pain, redness, or drainage from the incision. 7. Difficulty swallowing or breathing 8. Muscle cramping or spasms. 9. Numbness or tingling in hands or feet or around lips.  The clinic staff is available to answer your questions during regular business hours.  Please dont  hesitate to call and ask to speak to one of the nurses if you have concerns.  For further questions, please visit [email or web address]

## 2015-06-27 NOTE — Discharge Summary (Signed)
[facility] Discharge Summary   Patient ID: Anita Villarreal MRN: [id number] DOB/AGE: Jun 26, 1971 44 y.o.  Admit date: 06/17/2015 Discharge date: 07/02/2015  Admitting Diagnosis: Gastric leak Post-operative ileus Abdominal pain Anemia of chronic disease  Discharge Diagnosis Patient Active Problem List   Diagnosis Date Noted  . Postoperative ileus 06/27/2015  . Gastric leak 06/27/2015  . Anemia of chronic disease 06/27/2015  . Hypokalemia 06/17/2015  . HCAP (healthcare-associated pneumonia) 06/17/2015  . Pneumonia 06/17/2015  . Intra-abdominal infection   . Abdominal abscess (HCC)   . Bipolar I disorder, most recent episode mixed (HCC) 06/07/2015  . Acute gastric perforation (HCC) 06/04/2015    Consultants Dr. Carmelina Dane (Psych) Dr. Virginia Rochester & Dr. Gonzella Lex (Internal medicine)  Imaging: No results found.  Procedures CT guided percutaneous drain - IR 06/17/15  Hospital Course:  44 y/o AA female with PMH Bipolar/MDD, known to our service after an admission between 06/04/15 to 06/15/15 for perforated gastric ulcer at the lesser curve with purulent contamination. She underwent Ex Lap, closure of gastric ulcer with omental patch drain placement by Dr. Luisa Hart on 06/04/15. She was discharged home on Levaquin for pneumonia which she says she's been taking but she could not tell me the dose or frequency. She was not able to take any of her home meds today. Since discharge her pain has gradually worsening, now 10/10 aching pain. Radicular pain over lower abdomen as well. It was so severe today that her family called the ambulance to have her brought to the hospital. She c/o SOB, chills, nausea, anorexia, loose stools, RUQ and right sided abdominal pain, leg swelling, and fatigue. She's been having good flatus and BM's. Denies CP, fever, dysuria, headache, weakness.   CT shows defect of the lesser curve of the stomach with air bubbles concerning for  perforated gastric ulcer. Intraperitoneal fluid collection 3.2 x 9.5 x 7.3cm along anterior abdominal wall, 3.9 x 7.7cm collection along the fundus of the stomach, a second fluid collection more inferiorly 4.7 x 5.3cm, multiple smaller fluid collections within the left omentum concerning for abscesses, and pleural effusions. Concern for a gastric leak.  CBC is 15.0, improved since discharge at 17.4, K is 2.9, Hgb is 7.7/Hct 22.7.   Patient was admitted and was transferred to radiology for perc drain of her intra-abdominal fluid collection.  She was made NPO and started on TPN due to severe protein calorie malnutrition.  NG was placed.  Internal medicine was consulted.  They helped [place] to manager her pleural effusion and possible HCAP, leg swelling, anemia, hypokalemia.  She continued vancomycin for 7 days and meropenem for 10 days.  Psych consulted to help [place] manager her bipolar medications while NPO.  Her ileus soon resolved and her diet was advanced as tolerated.  IR drainage fell to 33mL/24hr and thus repeat CT scan was done (06/24/15).  Fluid collection had resolved and thus IR drain was discontinued.  CT also showed new colitis and continued fluid collections around the stomach (showed decrease).  Cultures from IR drainage and blood cultures showed no growth to date.  Her fevers resolved, but her WBC remained high.    It dropped to 10.9 on 06/26/15, but rose after switching to oral antibiotics (Augmentin).  WBC spiked 13.5 on 06/27/15.  She was mildly tachycardic on last admission and has been this admission as well (102-115).  Wound vac was discontinued as the wound healed it no longer needed a wound vac and WD dressing changes started daily.    Diet  was advanced as tolerated.  Repeat CT abdomen on 06/28/15 showed  Fluid collection posterior cyst stomach is slightly smaller compared to the prior CT. The second more inferior fluid collection adjacent to the stomach is unchanged compared to prior exam. No  new fluid collections are identified. The previously noted stranding inflammation in the mesentery of the left abdomen are unchanged.  WBC trended down to 14.0.  On HD #13, the patient was voiding well, tolerating diet, ambulating well, pain well controlled, vital signs stable, midline wound clean and felt stable for discharge home.  Patient will follow up in our office in 2 weeks with Dr. Luisa Hart and knows to call with questions or concerns.  HH is set up to help monitor wound healing.  Will have her go to [facility] lab to have her CBC rechecked (check stability of Hgb and WBC)      Medication List    STOP taking these medications        levofloxacin 750 MG tablet  Commonly known as:  LEVAQUIN     promethazine 25 MG tablet  Commonly known as:  PHENERGAN      TAKE these medications        albuterol 108 (90 Base) MCG/ACT inhaler  Commonly known as:  PROVENTIL HFA;VENTOLIN HFA  Inhale 2 puffs into the lungs every 6 (six) hours as needed. Wheezing     albuterol (2.5 MG/3ML) 0.083% nebulizer solution  Commonly known as:  PROVENTIL  Take 2.5 mg by nebulization every 6 (six) hours as needed for wheezing.     amoxicillin-clavulanate 875-125 MG tablet  Commonly known as:  AUGMENTIN  Take 1 tablet by mouth every 12 (twelve) hours.     amoxicillin-clavulanate 875-125 MG tablet  Commonly known as:  AUGMENTIN  Take 1 tablet by mouth 2 (two) times daily.     budesonide-formoterol 80-4.5 MCG/ACT inhaler  Commonly known as:  SYMBICORT  Inhale 2 puffs into the lungs 2 (two) times daily.     dicyclomine 20 MG tablet  Commonly known as:  BENTYL  Take 20 mg by mouth 4 (four) times daily.     famotidine 20 MG tablet  Commonly known as:  PEPCID  Take 1 tablet (20 mg total) by mouth 2 (two) times daily. To reduce acid production to allow your ulcer to heal     feeding supplement (ENSURE ENLIVE) Liqd  Take 237 mLs by mouth 2 (two) times daily between meals.     gabapentin 100 MG capsule   Commonly known as:  NEURONTIN  Take 100 mg by mouth 2 (two) times daily.     hydrOXYzine 50 MG tablet  Commonly known as:  ATARAX/VISTARIL  Take 50 mg by mouth at bedtime.     levothyroxine 25 MCG tablet  Commonly known as:  SYNTHROID, LEVOTHROID  Take 1 tablet (25 mcg total) by mouth daily before breakfast. For low thyroid function.     lithium carbonate 300 MG capsule  Take 300 mg by mouth 2 (two) times daily.     methocarbamol 500 MG tablet  Commonly known as:  ROBAXIN  Take 2 tablets (1,000 mg total) by mouth every 8 (eight) hours as needed for muscle spasms (or pain).     oxyCODONE-acetaminophen 7.5-325 MG tablet  Commonly known as:  PERCOCET  Take 1-2 tablets by mouth every 6 (six) hours as needed for moderate pain or severe pain.     oxyCODONE-acetaminophen 5-325 MG tablet  Commonly known as:  ROXICET  Take 1-2  tablets by mouth every 4 (four) hours as needed for moderate pain.     pantoprazole 40 MG tablet  Commonly known as:  PROTONIX  Take 1 tablet (40 mg total) by mouth 2 (two) times daily before a meal. To reduce acid production to allow your ulcer to heal     pantoprazole 40 MG tablet  Commonly known as:  PROTONIX  Take 1 tablet (40 mg total) by mouth daily.     SUMAtriptan 100 MG tablet  Commonly known as:  IMITREX  Take 100 mg by mouth every 2 (two) hours as needed. migraines     ziprasidone 80 MG capsule  Commonly known as:  GEODON  Take 80 mg by mouth 2 (two) times daily with a meal.         Follow-up Information    Follow up with Harriette BouillonORNETT,THOMAS A., MD. Nyra CapesGo on 07/15/2015.   Specialty:  General Surgery   Why:  Monday 07/15/15 at 2:20 pm with Dr Luisa Hartornett , For post-operation check, please arrive 30 min early to check in and fill out paperwork.   Contact information:   [address] [place] [address] [phone]       Schedule an appointment as soon as possible for a visit with [facility].   Why:  ASAP follow up with your  primary care provider regarding low thyroid function, low potassium, anemia, leg swelling, fluid on lungs.        Go on 07/15/2015 to follow up.   Why:  Go to [facility] 2nd floor of Dr. Rosezena Sensorornett's office building prior to your appointment.  Address: [address] [place], [place] [address].  Phone number: [phone]      Follow up with SPENCER,SARA C, PA-C. Schedule an appointment as soon as possible for a visit in 2 weeks.   Specialty:  Physician Assistant   Why:  For post-hospital follow up   Contact information:   [address] [place] [address] [phone]       Follow up with [facility].   Why:  Home Health RN    Contact information:   [address] [place] [address] [phone]       Signed: Bradd CanaryMegan N. Kimani Hovis, PA-C [facility] [phone]  07/02/2015, 3:23 PM

## 2015-06-27 NOTE — Progress Notes (Signed)
[facility] Progress Note     Subjective: Feels good.  No N/V or abdominal pain.  Wound pain much improved.  Ambulating well through the hall.  Tolerating soft diet.  Smile on her face.  Wants to go home.    Objective: Vital signs in last 24 hours: Temp:  [98.3 F (36.8 C)-99.6 F (37.6 C)] 99.6 F (37.6 C) (12/22 0515) Pulse Rate:  [102-115] 115 (12/22 0515) Resp:  [16-18] 18 (12/22 0515) BP: (109-118)/(61-74) 117/61 mmHg (12/22 0515) SpO2:  [97 %-100 %] 99 % (12/22 0515) Last BM Date: 06/24/15  Intake/Output from previous day: 12/21 0701 - 12/22 0700 In: 1540 [P.O.:1530; I.V.:10] Out: 1000 [Urine:1000] Intake/Output this shift: Total I/O In: 400 [P.O.:400] Out: -   PE: Gen: Alert, NAD, pleasant Card: RRR, no M/G/R heard Pulm: CTA, no W/R/R Abd: Obese, soft, NT/ND, +BS, no HSM, wound with 100% granulation tissue, no drainage, too shallow/small for wound vac Ext: No erythema, or tenderness, b/l edema minimally improved   06/26/15   Lab Results:   Recent Labs  06/26/15 0606 06/27/15 0742  WBC 10.9* 13.5*  HGB 7.8* 8.0*  HCT 23.2* 24.8*  PLT 642* 676*   BMET  Recent Labs  06/25/15 0422 06/26/15 0606  NA 136 134*  K 4.0 4.0  CL 102 102  CO2 25 22  GLUCOSE 110* 111*  BUN 9 9  CREATININE 0.64 0.58  CALCIUM 9.2 8.9   PT/INR No results for input(s): LABPROT, INR in the last 72 hours. CMP     Component Value Date/Time   NA 134* 06/26/2015 0606   K 4.0 06/26/2015 0606   CL 102 06/26/2015 0606   CO2 22 06/26/2015 0606   GLUCOSE 111* 06/26/2015 0606   BUN 9 06/26/2015 0606   CREATININE 0.58 06/26/2015 0606   CALCIUM 8.9 06/26/2015 0606   PROT 6.7 06/24/2015 0420   ALBUMIN 2.0* 06/24/2015 0420   AST 131* 06/24/2015 0420   ALT 75* 06/24/2015 0420   ALKPHOS 199* 06/24/2015 0420   BILITOT 0.3 06/24/2015 0420   GFRNONAA >60 06/26/2015 0606   GFRAA >60 06/26/2015 0606   Lipase     Component Value Date/Time   LIPASE 36  06/17/2015 0428       Studies/Results: No results found.  Anti-infectives: Anti-infectives    Start     Dose/Rate Route Frequency Ordered Stop   06/26/15 1400  amoxicillin-clavulanate (AUGMENTIN) 875-125 MG per tablet 1 tablet     1 tablet Oral Every 12 hours 06/26/15 1344     06/17/15 1700  vancomycin (VANCOCIN) IVPB 1000 mg/200 mL premix  Status:  Discontinued     1,000 mg 200 mL/hr over 60 Minutes Intravenous Every 8 hours 06/17/15 0829 06/23/15 1314   06/17/15 1700  piperacillin-tazobactam (ZOSYN) IVPB 3.375 g  Status:  Discontinued     3.375 g 12.5 mL/hr over 240 Minutes Intravenous 3 times per day 06/17/15 0832 06/17/15 1225   06/17/15 1300  meropenem (MERREM) 1 g in sodium chloride 0.9 % 100 mL IVPB  Status:  Discontinued     1 g 200 mL/hr over 30 Minutes Intravenous Every 8 hours 06/17/15 1227 06/26/15 1344   06/17/15 0845  vancomycin (VANCOCIN) 2,000 mg in sodium chloride 0.9 % 500 mL IVPB     2,000 mg 250 mL/hr over 120 Minutes Intravenous  Once 06/17/15 0829 06/17/15 1205   06/17/15 0845  piperacillin-tazobactam (ZOSYN) IVPB 3.375 g     3.375 g 100 mL/hr over 30 Minutes Intravenous  Once 06/17/15 0832 06/17/15 0947   06/17/15 0830  piperacillin-tazobactam (ZOSYN) IVPB 3.375 g  Status:  Discontinued     3.375 g 12.5 mL/hr over 240 Minutes Intravenous 3 times per day 06/17/15 [id number] 06/17/15 [id number]       Assessment/Plan Perforated lesser curve gastric ulcer with purulent contamination of the abdominal cavity POD #23 s/p Ex Lap with omental patch repair, drain placement Intraabdominal fluid collections concerning for abscess -IR drain removed yesterday, healing well -IVF, pain control, antiemetics, Vanc 7/7d HCAP, Meropenem- 10d abd infection, switched to Augmentin Day #2.  Having BM's and flatus. Cultures showed no growth to date. -WBC up to 13.5 today after starting oral abx, will recheck tomorrow -Tolerating soft diet and breeze.  -Protonix, pepcid -Ambulate and  IS -CT (06/24/15) showed colitis and continued fluid collections around the stomach (show decrease), but resolution of fluid collection around the IR drain -Has follow up scheduled with Dr. Luisa Hartornett -VAC discontinued and WD dressing changes daily started, updated case management  PCM - Improved prealb 12.8, wean TPN Bipolar -Oral meds -Appreciate psych consult -This had a large effect on her compliance with medical care during her last admission Elevated TSH, normal T4 -Psych ordered TSH in setting of lithium use, TSH was found to be 6.5, medicine recommend starting low dose synthroid.  Recheck TSH in 3 months -Medicine ordered thyroid peroxidase antibiody Hypokalemia -This was a chronic issue on last admission Anemia of chronic disease -To follow up as an outpatient -Hgb has been between 7.8 and 8.2 over last several days. Will have patient get CBC rechecked prior to her appointment with Dr. Luisa Hartornett to make sure its improving.  LE Edema - 3rd spacing Pleural Effusion/HCAP? - finished 7 day course for HCAP DVT proph - SCD's and lovenox (60mg ) Disp - d/c tomorrow if CBC improved. She will follow up with her PCP at discharge.    LOS: 9 days    Nonie HoyerMegan N Tyee Vandevoorde 06/27/2015, 12:20 PM Pager: [phone]

## 2015-06-28 ENCOUNTER — Encounter (HOSPITAL_COMMUNITY): Payer: Self-pay | Admitting: Radiology

## 2015-06-28 ENCOUNTER — Inpatient Hospital Stay (HOSPITAL_COMMUNITY): Payer: Medicaid Other

## 2015-06-28 LAB — CBC
HCT: 24.5 % — ABNORMAL LOW (ref 36.0–46.0)
HEMOGLOBIN: 8 g/dL — AB (ref 12.0–15.0)
MCH: 28.2 pg (ref 26.0–34.0)
MCHC: 32.7 g/dL (ref 30.0–36.0)
MCV: 86.3 fL (ref 78.0–100.0)
Platelets: 662 10*3/uL — ABNORMAL HIGH (ref 150–400)
RBC: 2.84 MIL/uL — ABNORMAL LOW (ref 3.87–5.11)
RDW: 16.4 % — AB (ref 11.5–15.5)
WBC: 15.2 10*3/uL — AB (ref 4.0–10.5)

## 2015-06-28 LAB — THYROID PEROXIDASE ANTIBODY: THYROID PEROXIDASE ANTIBODY: 12 [IU]/mL (ref 0–34)

## 2015-06-28 MED ORDER — BARIUM SULFATE 2.1 % PO SUSP
900.0000 mL | ORAL | Status: AC
  Administered 2015-06-28: 900 mL via ORAL

## 2015-06-28 MED ORDER — BARIUM SULFATE 2.1 % PO SUSP
ORAL | Status: AC
  Administered 2015-06-28: 1 mL
  Filled 2015-06-28: qty 2

## 2015-06-28 MED ORDER — IOHEXOL 300 MG/ML  SOLN
100.0000 mL | Freq: Once | INTRAMUSCULAR | Status: AC | PRN
  Administered 2015-06-28: 100 mL via INTRAVENOUS

## 2015-06-28 NOTE — Progress Notes (Signed)
[facility] Progress Note     Subjective: Pt doing well, has intermittent abdominal pain, but overall improved from admission.  Ambulating well.  Tolerating diet, but still quite anorexic.  Having BM's (last one 1.5 days ago) and flatus.    Objective: Vital signs in last 24 hours: Temp:  [98.8 F (37.1 C)-99.5 F (37.5 C)] 98.8 F (37.1 C) (12/23 0526) Pulse Rate:  [99-110] 109 (12/23 0526) Resp:  [16-19] 19 (12/23 0526) BP: (113-120)/(70-78) 114/77 mmHg (12/23 0526) SpO2:  [95 %-100 %] 100 % (12/23 0526) Last BM Date: 06/24/15  Intake/Output from previous day: 12/22 0701 - 12/23 0700 In: 950 [P.O.:950] Out: -  Intake/Output this shift:    PE: Gen: Alert, NAD, pleasant Abd: Obese, soft, NT/ND, +BS, no HSM, wound with 100% granulation tissue, no drainage, too shallow/small for wound vac Ext: No erythema, or tenderness, b/l edema  Lab Results:   Recent Labs  06/27/15 0742 06/28/15 0600  WBC 13.5* 15.2*  HGB 8.0* 8.0*  HCT 24.8* 24.5*  PLT 676* 662*   BMET  Recent Labs  06/26/15 0606  NA 134*  K 4.0  CL 102  CO2 22  GLUCOSE 111*  BUN 9  CREATININE 0.58  CALCIUM 8.9   PT/INR No results for input(s): LABPROT, INR in the last 72 hours. CMP     Component Value Date/Time   NA 134* 06/26/2015 0606   K 4.0 06/26/2015 0606   CL 102 06/26/2015 0606   CO2 22 06/26/2015 0606   GLUCOSE 111* 06/26/2015 0606   BUN 9 06/26/2015 0606   CREATININE 0.58 06/26/2015 0606   CALCIUM 8.9 06/26/2015 0606   PROT 6.7 06/24/2015 0420   ALBUMIN 2.0* 06/24/2015 0420   AST 131* 06/24/2015 0420   ALT 75* 06/24/2015 0420   ALKPHOS 199* 06/24/2015 0420   BILITOT 0.3 06/24/2015 0420   GFRNONAA >60 06/26/2015 0606   GFRAA >60 06/26/2015 0606   Lipase     Component Value Date/Time   LIPASE 36 06/17/2015 0428       Studies/Results: No results found.  Anti-infectives: Anti-infectives    Start     Dose/Rate Route Frequency Ordered Stop   06/27/15 0000   amoxicillin-clavulanate (AUGMENTIN) 875-125 MG tablet     1 tablet Oral Every 12 hours 06/27/15 1431     06/26/15 1400  amoxicillin-clavulanate (AUGMENTIN) 875-125 MG per tablet 1 tablet     1 tablet Oral Every 12 hours 06/26/15 1344     06/17/15 1700  vancomycin (VANCOCIN) IVPB 1000 mg/200 mL premix  Status:  Discontinued     1,000 mg 200 mL/hr over 60 Minutes Intravenous Every 8 hours 06/17/15 0829 06/23/15 1314   06/17/15 1700  piperacillin-tazobactam (ZOSYN) IVPB 3.375 g  Status:  Discontinued     3.375 g 12.5 mL/hr over 240 Minutes Intravenous 3 times per day 06/17/15 0832 06/17/15 1225   06/17/15 1300  meropenem (MERREM) 1 g in sodium chloride 0.9 % 100 mL IVPB  Status:  Discontinued     1 g 200 mL/hr over 30 Minutes Intravenous Every 8 hours 06/17/15 1227 06/26/15 1344   06/17/15 0845  vancomycin (VANCOCIN) 2,000 mg in sodium chloride 0.9 % 500 mL IVPB     2,000 mg 250 mL/hr over 120 Minutes Intravenous  Once 06/17/15 0829 06/17/15 1205   06/17/15 0845  piperacillin-tazobactam (ZOSYN) IVPB 3.375 g     3.375 g 100 mL/hr over 30 Minutes Intravenous  Once 06/17/15 4098 06/17/15 0947   06/17/15  0830  piperacillin-tazobactam (ZOSYN) IVPB 3.375 g  Status:  Discontinued     3.375 g 12.5 mL/hr over 240 Minutes Intravenous 3 times per day 06/17/15 0829 06/17/15 [id number]       Assessment/Plan Perforated lesser curve gastric ulcer with purulent contamination of the abdominal cavity POD #24 s/p Ex Lap with omental patch repair, drain placement Intraabdominal fluid collections concerning for abscess -IR drain removed 06/26/15, site healing well -IVF, pain control, antiemetics, Vanc 7/7d HCAP, Meropenem- 10d abd infection, switched to Augmentin Day #3. Having BM's and flatus. Cultures showed no growth to date. -Tolerating soft diet and breeze.  -Protonix, pepcid -Ambulate and IS -CT (06/24/15) showed colitis and continued fluid collections around the stomach (show decrease), but  resolution of fluid collection around the IR drain -Has follow up scheduled with Dr. Luisa Hartornett -VAC discontinued and WD dressing changes daily started, updated case management  PCM - Improved prealb 12.8, wean TPN Bipolar -Oral meds -Appreciate psych consult -This had a large effect on her compliance with medical care during her last admission Elevated TSH, normal T4 -Psych ordered TSH in setting of lithium use, TSH was found to be 6.5, medicine recommend starting low dose synthroid. Recheck TSH in 3 months -Thyroid peroxidase antibiody normal Hypokalemia -This was a chronic issue on last admission Anemia of chronic disease -To follow up as an outpatient -Hgb has been between 7.8 and 8.2 over last several days. Will have patient get CBC rechecked prior to her appointment with Dr. Luisa Hartornett to make sure its improving.  LE Edema - 3rd spacing Pleural Effusion/HCAP? - finished 7 day course for HCAP DVT proph - SCD's and lovenox (60mg ) ID - Don't know why WBC continues to trend up, but last admission her WBC did that and she had several intra-abdominal fluid collections.  Continues to be anorexic as well.  Remain afebrile, but WBC up to 15.2.  Will repeat abdominal CT scan today to check colitis and peri-gastric fluid collections.  Talked to Dr. Miles CostainShick, they can attempt drain placement if fluid collections worse.  Will check UA to make sure no occult infection.  Denies leg tenderness but has b/l swelling, denies dysuria, denies CP/cough/SOB, denies any other pain or sits of possible infection.  Don't think we need to check c.diff since she is asymptomatic and hasn't had a BM in 1.5 days.  Breathing well and IS up to almost 1500.  Consider ID consult Disp - Continue inpatient for now, possible discharge if fluid collections are improved    LOS: 10 days    Nonie HoyerMegan N Laretta Pyatt 06/28/2015, 8:27 AM Pager: [phone]

## 2015-06-28 NOTE — Care Management Note (Signed)
Case Management Note  Patient Details  Name: Bernadene BellMelanie Kington MRN: [id number] Date of Birth: [birth year]  Subjective/Objective:                    Action/Plan:  UR updated  Expected Discharge Date:                  Expected Discharge Plan:  Home w Home Health Services  In-House Referral:     Discharge planning Services     Post Acute Care Choice:    Choice offered to:  Patient  DME Arranged:    DME Agency:     HH Arranged:  RN HH Agency:  [facility]  Status of Service:  Completed, signed off  Medicare Important Message Given:    Date Medicare IM Given:    Medicare IM give by:    Date Additional Medicare IM Given:    Additional Medicare Important Message give by:     If discussed at Long Length of Stay Meetings, dates discussed:    Additional Comments:  [name], Cleon Thoma Marie, RN 06/28/2015, 2:57 PM

## 2015-06-29 LAB — CBC
HEMATOCRIT: 23.6 % — AB (ref 36.0–46.0)
HEMOGLOBIN: 8 g/dL — AB (ref 12.0–15.0)
MCH: 29 pg (ref 26.0–34.0)
MCHC: 33.9 g/dL (ref 30.0–36.0)
MCV: 85.5 fL (ref 78.0–100.0)
Platelets: 628 10*3/uL — ABNORMAL HIGH (ref 150–400)
RBC: 2.76 MIL/uL — AB (ref 3.87–5.11)
RDW: 16.6 % — ABNORMAL HIGH (ref 11.5–15.5)
WBC: 14 10*3/uL — AB (ref 4.0–10.5)

## 2015-06-29 MED ORDER — OXYCODONE-ACETAMINOPHEN 5-325 MG PO TABS: 1.0000 | ORAL_TABLET | ORAL | Status: DC | PRN

## 2015-06-29 MED ORDER — PANTOPRAZOLE SODIUM 40 MG PO TBEC: 40.0000 mg | DELAYED_RELEASE_TABLET | Freq: Every day | ORAL | Status: DC

## 2015-06-29 MED ORDER — AMOXICILLIN-POT CLAVULANATE 875-125 MG PO TABS: 1.0000 | ORAL_TABLET | Freq: Two times a day (BID) | ORAL | Status: DC

## 2015-06-29 NOTE — Progress Notes (Signed)
Patient ID: Anita BellMelanie Villarreal, female   DOB: August 09, 1970, 44 y.o.   MRN: [id number]  General Surgery [facility]  POD#: 25  Subjective: Patient up and to bathroom this AM. Having BM's.  Pain improved.  Tolerating diet.  Family at bedside.  Wants to go home today.  Objective: Vital signs in last 24 hours: Temp:  [98.2 F (36.8 C)-100.2 F (37.9 C)] 100.2 F (37.9 C) (12/24 0615) Pulse Rate:  [99-126] 126 (12/24 0615) Resp:  [18-20] 20 (12/24 0615) BP: (100-133)/(66-82) 115/70 mmHg (12/24 0615) SpO2:  [96 %-99 %] 96 % (12/24 0615) Last BM Date: 06/25/15  Intake/Output from previous day: 12/23 0701 - 12/24 0700 In: 480 [P.O.:480] Out: 1200 [Urine:1200] Intake/Output this shift:    Physical Exam: HEENT - sclerae clear, mucous membranes moist Neck - soft Chest - clear bilaterally Cor - RRR Abdomen - soft, obese; BS present; dressings dry and intact  Lab Results:   Recent Labs  06/28/15 0600 06/29/15 0536  WBC 15.2* 14.0*  HGB 8.0* 8.0*  HCT 24.5* 23.6*  PLT 662* 628*   BMET No results for input(s): NA, K, CL, CO2, GLUCOSE, BUN, CREATININE, CALCIUM in the last 72 hours. PT/INR No results for input(s): LABPROT, INR in the last 72 hours. Comprehensive Metabolic Panel:    Component Value Date/Time   NA 134* 06/26/2015 0606   NA 136 06/25/2015 0422   K 4.0 06/26/2015 0606   K 4.0 06/25/2015 0422   CL 102 06/26/2015 0606   CL 102 06/25/2015 0422   CO2 22 06/26/2015 0606   CO2 25 06/25/2015 0422   BUN 9 06/26/2015 0606   BUN 9 06/25/2015 0422   CREATININE 0.58 06/26/2015 0606   CREATININE 0.64 06/25/2015 0422   GLUCOSE 111* 06/26/2015 0606   GLUCOSE 110* 06/25/2015 0422   CALCIUM 8.9 06/26/2015 0606   CALCIUM 9.2 06/25/2015 0422   AST 131* 06/24/2015 0420   AST 49* 06/20/2015 0513   ALT 75* 06/24/2015 0420   ALT 21 06/20/2015 0513   ALKPHOS 199* 06/24/2015 0420   ALKPHOS 137* 06/20/2015 0513   BILITOT 0.3 06/24/2015 0420   BILITOT 0.5  06/20/2015 0513   PROT 6.7 06/24/2015 0420   PROT 5.9* 06/20/2015 0513   ALBUMIN 2.0* 06/24/2015 0420   ALBUMIN 1.6* 06/20/2015 0513    Studies/Results: Ct Abdomen Pelvis W Contrast  06/28/2015  CLINICAL DATA:  Follow-up known intra-abdominal collection. Patient has a history of recent perforated gastric also status post exploratory laparotomy with closure with pactch drain placement. EXAM: CT ABDOMEN AND PELVIS WITH CONTRAST TECHNIQUE: Multidetector CT imaging of the abdomen and pelvis was performed using the standard protocol following bolus administration of intravenous contrast. CONTRAST:  100mL OMNIPAQUE IOHEXOL 300 MG/ML  SOLN COMPARISON:  June 24 2015 FINDINGS: The previously noted fluid collection posterior to the stomach is slightly smaller currently measuring 5.4 x 3.6 cm. The second more inferior collection adjacent to the stomach is unchanged compared to prior exam measuring 2.2 x 4.4 cm. No new fluid collections are identified. The previously noted stranding and inflammation surrounding the mesenteric of the left abdomen are unchanged. The liver, spleen, pancreas, adrenal glands and kidneys are normal. There is no hydronephrosis bilaterally. Ascites is noted around liver. The aorta is normal in caliber. There is no abdominal lymphadenopathy. There is no small bowel obstruction or diverticulitis. Diffuse bowel wall thickening is identified in the stomach unchanged. Fluid-filled bladder is normal. Tubelike of fluid collections are identified in the pelvis unchanged,  question hydrosalpinx. Atelectasis of both lung bases are noted. There is a small right pleural effusion. No acute abnormality is identified in the visualized bones. IMPRESSION: Fluid collection posterior cyst stomach is slightly smaller compared to the prior CT. The second more inferior fluid collection adjacent to the stomach is unchanged compared to prior exam. No new fluid collections are identified. The previously noted  stranding inflammation in the mesentery of the left abdomen are unchanged. Electronically Signed   By: Sherian Rein M.D.   On: 06/28/2015 18:01    Anti-infectives: Anti-infectives    Start     Dose/Rate Route Frequency Ordered Stop   06/27/15 0000  amoxicillin-clavulanate (AUGMENTIN) 875-125 MG tablet     1 tablet Oral Every 12 hours 06/27/15 1431     06/26/15 1400  amoxicillin-clavulanate (AUGMENTIN) 875-125 MG per tablet 1 tablet     1 tablet Oral Every 12 hours 06/26/15 1344     06/17/15 1700  vancomycin (VANCOCIN) IVPB 1000 mg/200 mL premix  Status:  Discontinued     1,000 mg 200 mL/hr over 60 Minutes Intravenous Every 8 hours 06/17/15 0829 06/23/15 1314   06/17/15 1700  piperacillin-tazobactam (ZOSYN) IVPB 3.375 g  Status:  Discontinued     3.375 g 12.5 mL/hr over 240 Minutes Intravenous 3 times per day 06/17/15 0832 06/17/15 1225   06/17/15 1300  meropenem (MERREM) 1 g in sodium chloride 0.9 % 100 mL IVPB  Status:  Discontinued     1 g 200 mL/hr over 30 Minutes Intravenous Every 8 hours 06/17/15 1227 06/26/15 1344   06/17/15 0845  vancomycin (VANCOCIN) 2,000 mg in sodium chloride 0.9 % 500 mL IVPB     2,000 mg 250 mL/hr over 120 Minutes Intravenous  Once 06/17/15 0829 06/17/15 1205   06/17/15 0845  piperacillin-tazobactam (ZOSYN) IVPB 3.375 g     3.375 g 100 mL/hr over 30 Minutes Intravenous  Once 06/17/15 0832 06/17/15 0947   06/17/15 0830  piperacillin-tazobactam (ZOSYN) IVPB 3.375 g  Status:  Discontinued     3.375 g 12.5 mL/hr over 240 Minutes Intravenous 3 times per day 06/17/15 1610 06/17/15 9604      Assessment & Plans: Perforated lesser curve gastric ulcer with purulent contamination of the abdominal cavity POD #25 s/p Ex Lap with omental patch repair, drain placement Intraabdominal fluid collections concerning for abscess  -IR drain removed 06/26/15, site healing well  Augmentin Day #4. Having BM's and flatus. Cultures showed no growth to date.  -Tolerating soft  diet and breeze.   -Protonix, pepcid  -Ambulate and IS  -Has follow up scheduled with Dr. Luisa Hart at [facility] office  -[facility] for wound care arranged  -discharge home today on oral abx  Velora Heckler, MD, [facility] Office: [phone]   Ayssa Bentivegna Judie Petit 06/29/2015

## 2015-06-29 NOTE — Care Management Note (Signed)
Case Management Note  Patient Details  Name: Anita Villarreal MRN: [id number] Date of Birth: [birth year]  Subjective/Objective:       s/p Ex Lap with omental patch repair, drain placement             Action/Plan: NCM spoke to pt and [facility] Pt will ask Unit RN for extra dressing. [facility] for scheduled dc today home with wet to dry dressing to wound. Pt states Unit RN did demostrate to her how to change the dressings.    Expected Discharge Date:  06/29/2015              Expected Discharge Plan:  Home w Home Health Services  In-House Referral:     Discharge planning Services  Home Health  Post Acute Care Choice:    Choice offered to:  Patient   [facility]  Status of Service:  Completed, signed off  Medicare Important Message Given:    Date Medicare IM Given:    Medicare IM give by:    Date Additional Medicare IM Given:    Additional Medicare Important Message give by:     If discussed at Long Length of Stay Meetings, dates discussed:    Additional Comments:  Anita Villarreal, Anita Dimiceli Ellen, RN 06/29/2015, 11:45 AM

## 2015-07-02 ENCOUNTER — Telehealth: Payer: Self-pay

## 2015-07-02 NOTE — Telephone Encounter (Signed)
Attempt to make transitional care call.  Patient states she is now being followed by another PCP and has all follow up appointments made.

## 2015-07-03 NOTE — Care Management (Signed)
Home VAC found on nursing unit . [facility] for pick [place] [phone] , ref number [id number]  Ronny FlurryHeather Ltanya Bayley RN BSN [phone]

## 2015-08-06 ENCOUNTER — Other Ambulatory Visit: Payer: Self-pay | Admitting: Internal Medicine

## 2015-08-06 DIAGNOSIS — R7989 Other specified abnormal findings of blood chemistry: Secondary | ICD-10-CM

## 2015-08-06 DIAGNOSIS — R945 Abnormal results of liver function studies: Secondary | ICD-10-CM

## 2015-08-14 ENCOUNTER — Ambulatory Visit
Admission: RE | Admit: 2015-08-14 | Discharge: 2015-08-14 | Disposition: A | Discharge status: Home / Self Care | Payer: Medicaid Other | Source: Ambulatory Visit | Attending: Internal Medicine | Admitting: Internal Medicine

## 2015-08-14 DIAGNOSIS — R945 Abnormal results of liver function studies: Secondary | ICD-10-CM

## 2015-08-14 DIAGNOSIS — R7989 Other specified abnormal findings of blood chemistry: Secondary | ICD-10-CM

## 2016-05-14 ENCOUNTER — Ambulatory Visit: Payer: Medicaid Other | Admitting: Obstetrics & Gynecology

## 2016-05-14 ENCOUNTER — Encounter: Payer: Self-pay | Admitting: Obstetrics & Gynecology

## 2016-05-14 VITALS — BP 110/78 | HR 94 | Temp 97.5°F | Ht 68.0 in | Wt 260.0 lb

## 2016-05-14 NOTE — Progress Notes (Addendum)
   Bernadene BellMelanie Villarreal is a pleasant 45 y.o. 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 On review of her records in Care Everywhere, patient also had a pap smear with ASCUS, negative HRHPV [facility] by her PCP. As per ASCCP guidelines, pap smear and HPV testing needs to be repeated in three years (treated as a normal pap smear). Given her history, it is okay to repeat the cotesting in one year, no further intervention is needed.  Patient was informed of this recommendation, will follow up with PCP.    She was told to get a screening mammogram soon, she says her PCP will schedule this soon.    Of note, this note was routed to PCP.  Jaynie CollinsUGONNA  Rozena Fierro, MD, FACOG Attending Obstetrician & Gynecologist, [facility], [facility]

## 2016-05-14 NOTE — Progress Notes (Signed)
Patient is in the office for initial visit.

## 2016-07-24 DIAGNOSIS — F319 Bipolar disorder, unspecified: Secondary | ICD-10-CM | POA: Diagnosis present

## 2016-07-24 DIAGNOSIS — Z8489 Family history of other specified conditions: Secondary | ICD-10-CM

## 2016-07-24 DIAGNOSIS — Z9049 Acquired absence of other specified parts of digestive tract: Secondary | ICD-10-CM

## 2016-07-24 DIAGNOSIS — Z8249 Family history of ischemic heart disease and other diseases of the circulatory system: Secondary | ICD-10-CM

## 2016-07-24 DIAGNOSIS — Z881 Allergy status to other antibiotic agents status: Secondary | ICD-10-CM

## 2016-07-24 DIAGNOSIS — J45909 Unspecified asthma, uncomplicated: Secondary | ICD-10-CM | POA: Diagnosis present

## 2016-07-24 DIAGNOSIS — Z833 Family history of diabetes mellitus: Secondary | ICD-10-CM

## 2016-07-24 DIAGNOSIS — K432 Incisional hernia without obstruction or gangrene: Principal | ICD-10-CM | POA: Diagnosis present

## 2016-07-24 DIAGNOSIS — F17210 Nicotine dependence, cigarettes, uncomplicated: Secondary | ICD-10-CM | POA: Diagnosis present

## 2016-07-24 DIAGNOSIS — Z882 Allergy status to sulfonamides status: Secondary | ICD-10-CM

## 2016-07-25 ENCOUNTER — Encounter (HOSPITAL_COMMUNITY): Payer: Self-pay | Admitting: Emergency Medicine

## 2016-07-25 ENCOUNTER — Emergency Department (HOSPITAL_COMMUNITY): Payer: Medicaid Other

## 2016-07-25 ENCOUNTER — Inpatient Hospital Stay (HOSPITAL_COMMUNITY)
Admission: EM | Admit: 2016-07-25 | Discharge: 2016-07-27 | DRG: 395 | Disposition: A | Discharge status: Home / Self Care | Payer: Medicaid Other | Attending: Surgery | Admitting: Surgery

## 2016-07-25 DIAGNOSIS — F319 Bipolar disorder, unspecified: Secondary | ICD-10-CM | POA: Diagnosis present

## 2016-07-25 DIAGNOSIS — F17210 Nicotine dependence, cigarettes, uncomplicated: Secondary | ICD-10-CM | POA: Diagnosis present

## 2016-07-25 DIAGNOSIS — R109 Unspecified abdominal pain: Secondary | ICD-10-CM | POA: Diagnosis not present

## 2016-07-25 DIAGNOSIS — Z833 Family history of diabetes mellitus: Secondary | ICD-10-CM | POA: Diagnosis not present

## 2016-07-25 DIAGNOSIS — Z881 Allergy status to other antibiotic agents status: Secondary | ICD-10-CM | POA: Diagnosis not present

## 2016-07-25 DIAGNOSIS — J45909 Unspecified asthma, uncomplicated: Secondary | ICD-10-CM | POA: Diagnosis present

## 2016-07-25 DIAGNOSIS — R1010 Upper abdominal pain, unspecified: Secondary | ICD-10-CM

## 2016-07-25 DIAGNOSIS — Z8249 Family history of ischemic heart disease and other diseases of the circulatory system: Secondary | ICD-10-CM | POA: Diagnosis not present

## 2016-07-25 DIAGNOSIS — Z9049 Acquired absence of other specified parts of digestive tract: Secondary | ICD-10-CM | POA: Diagnosis not present

## 2016-07-25 DIAGNOSIS — K432 Incisional hernia without obstruction or gangrene: Secondary | ICD-10-CM | POA: Diagnosis not present

## 2016-07-25 DIAGNOSIS — Z882 Allergy status to sulfonamides status: Secondary | ICD-10-CM | POA: Diagnosis not present

## 2016-07-25 DIAGNOSIS — Z8489 Family history of other specified conditions: Secondary | ICD-10-CM | POA: Diagnosis not present

## 2016-07-25 LAB — URINALYSIS, ROUTINE W REFLEX MICROSCOPIC
Bilirubin Urine: NEGATIVE
Glucose, UA: NEGATIVE mg/dL
KETONES UR: NEGATIVE mg/dL
Nitrite: NEGATIVE
Protein, ur: 30 mg/dL — AB
Specific Gravity, Urine: 1.029 (ref 1.005–1.030)
pH: 5 (ref 5.0–8.0)

## 2016-07-25 LAB — CBC
HEMATOCRIT: 36.6 % (ref 36.0–46.0)
HEMATOCRIT: 39.4 % (ref 36.0–46.0)
Hemoglobin: 12.2 g/dL (ref 12.0–15.0)
Hemoglobin: 13.3 g/dL (ref 12.0–15.0)
MCH: 30.4 pg (ref 26.0–34.0)
MCH: 30.7 pg (ref 26.0–34.0)
MCHC: 33.3 g/dL (ref 30.0–36.0)
MCHC: 33.8 g/dL (ref 30.0–36.0)
MCV: 91 fL (ref 78.0–100.0)
MCV: 91.3 fL (ref 78.0–100.0)
Platelets: 341 10*3/uL (ref 150–400)
Platelets: 392 10*3/uL (ref 150–400)
RBC: 4.01 MIL/uL (ref 3.87–5.11)
RBC: 4.33 MIL/uL (ref 3.87–5.11)
RDW: 15.1 % (ref 11.5–15.5)
RDW: 15.3 % (ref 11.5–15.5)
WBC: 10.2 10*3/uL (ref 4.0–10.5)
WBC: 7.2 10*3/uL (ref 4.0–10.5)

## 2016-07-25 LAB — COMPREHENSIVE METABOLIC PANEL
ALBUMIN: 4.1 g/dL (ref 3.5–5.0)
ALT: 14 U/L (ref 14–54)
AST: 21 U/L (ref 15–41)
Alkaline Phosphatase: 107 U/L (ref 38–126)
Anion gap: 11 (ref 5–15)
BILIRUBIN TOTAL: 0.4 mg/dL (ref 0.3–1.2)
BUN: 10 mg/dL (ref 6–20)
CHLORIDE: 107 mmol/L (ref 101–111)
CO2: 22 mmol/L (ref 22–32)
Calcium: 9.9 mg/dL (ref 8.9–10.3)
Creatinine, Ser: 0.8 mg/dL (ref 0.44–1.00)
GFR calc Af Amer: >60 mL/min (ref >60)
GFR calc non Af Amer: >60 mL/min (ref >60)
GLUCOSE: 100 mg/dL — AB (ref 65–99)
POTASSIUM: 3.7 mmol/L (ref 3.5–5.1)
Sodium: 140 mmol/L (ref 135–145)
TOTAL PROTEIN: 7.7 g/dL (ref 6.5–8.1)

## 2016-07-25 LAB — LIPASE, BLOOD: Lipase: 22 U/L (ref 11–51)

## 2016-07-25 LAB — HCG, QUANTITATIVE, PREGNANCY: hCG, Beta Chain, Quant, S: 2 m[IU]/mL (ref <5)

## 2016-07-25 MED ORDER — MOMETASONE FURO-FORMOTEROL FUM 100-5 MCG/ACT IN AERO: 2.0000 | INHALATION_SPRAY | Freq: Two times a day (BID) | RESPIRATORY_TRACT | Status: DC

## 2016-07-25 MED ORDER — ZIPRASIDONE HCL 80 MG PO CAPS
80.0000 mg | ORAL_CAPSULE | Freq: Two times a day (BID) | ORAL | Status: DC
  Administered 2016-07-25 – 2016-07-27 (×5): 80 mg via ORAL
  Filled 2016-07-25: qty 1
  Filled 2016-07-25 (×2): qty 4
  Filled 2016-07-25: qty 1
  Filled 2016-07-25: qty 4
  Filled 2016-07-25 (×3): qty 1

## 2016-07-25 MED ORDER — DIPHENHYDRAMINE HCL 12.5 MG/5ML PO ELIX: 12.5000 mg | ORAL_SOLUTION | Freq: Four times a day (QID) | ORAL | Status: DC | PRN

## 2016-07-25 MED ORDER — ALBUTEROL SULFATE HFA 108 (90 BASE) MCG/ACT IN AERS: 2.0000 | INHALATION_SPRAY | Freq: Four times a day (QID) | RESPIRATORY_TRACT | Status: DC | PRN

## 2016-07-25 MED ORDER — MORPHINE SULFATE (PF) 4 MG/ML IV SOLN
2.0000 mg | INTRAVENOUS | Status: DC | PRN
  Administered 2016-07-25 – 2016-07-27 (×11): 2 mg via INTRAVENOUS
  Filled 2016-07-25 (×11): qty 1

## 2016-07-25 MED ORDER — ALBUTEROL SULFATE (2.5 MG/3ML) 0.083% IN NEBU: 2.5000 mg | INHALATION_SOLUTION | Freq: Four times a day (QID) | RESPIRATORY_TRACT | Status: DC | PRN

## 2016-07-25 MED ORDER — MOMETASONE FURO-FORMOTEROL FUM 100-5 MCG/ACT IN AERO
2.0000 | INHALATION_SPRAY | Freq: Two times a day (BID) | RESPIRATORY_TRACT | Status: DC
  Administered 2016-07-25 – 2016-07-27 (×4): 2 via RESPIRATORY_TRACT
  Filled 2016-07-25: qty 8.8

## 2016-07-25 MED ORDER — NICOTINE 21 MG/24HR TD PT24
21.0000 mg | MEDICATED_PATCH | Freq: Every day | TRANSDERMAL | Status: DC
  Administered 2016-07-25 – 2016-07-27 (×3): 21 mg via TRANSDERMAL
  Filled 2016-07-25 (×3): qty 1

## 2016-07-25 MED ORDER — SUMATRIPTAN SUCCINATE 100 MG PO TABS: 100.0000 mg | ORAL_TABLET | ORAL | Status: DC | PRN

## 2016-07-25 MED ORDER — ONDANSETRON 4 MG PO TBDP: 4.0000 mg | ORAL_TABLET | Freq: Four times a day (QID) | ORAL | Status: DC | PRN

## 2016-07-25 MED ORDER — LITHIUM CARBONATE 300 MG PO CAPS
300.0000 mg | ORAL_CAPSULE | Freq: Two times a day (BID) | ORAL | Status: DC
  Administered 2016-07-25 – 2016-07-27 (×4): 300 mg via ORAL
  Filled 2016-07-25 (×6): qty 1

## 2016-07-25 MED ORDER — ZOLPIDEM TARTRATE 5 MG PO TABS: 5.0000 mg | ORAL_TABLET | Freq: Every evening | ORAL | Status: DC | PRN

## 2016-07-25 MED ORDER — HYDRALAZINE HCL 20 MG/ML IJ SOLN: 10.0000 mg | INTRAMUSCULAR | Status: DC | PRN

## 2016-07-25 MED ORDER — IOPAMIDOL (ISOVUE-300) INJECTION 61%
INTRAVENOUS | Status: AC
  Administered 2016-07-25: 100 mL
  Filled 2016-07-25: qty 100

## 2016-07-25 MED ORDER — MORPHINE SULFATE (PF) 4 MG/ML IV SOLN
4.0000 mg | Freq: Once | INTRAVENOUS | Status: AC
  Administered 2016-07-25: 4 mg via INTRAVENOUS
  Filled 2016-07-25: qty 1

## 2016-07-25 MED ORDER — PANTOPRAZOLE SODIUM 40 MG IV SOLR
40.0000 mg | Freq: Every day | INTRAVENOUS | Status: DC
  Administered 2016-07-25 – 2016-07-26 (×2): 40 mg via INTRAVENOUS
  Filled 2016-07-25 (×2): qty 40

## 2016-07-25 MED ORDER — KCL IN DEXTROSE-NACL 20-5-0.9 MEQ/L-%-% IV SOLN
INTRAVENOUS | Status: DC
  Administered 2016-07-25 – 2016-07-27 (×5): via INTRAVENOUS
  Filled 2016-07-25 (×8): qty 1000

## 2016-07-25 MED ORDER — METHOCARBAMOL 500 MG PO TABS
500.0000 mg | ORAL_TABLET | Freq: Four times a day (QID) | ORAL | Status: DC | PRN
  Administered 2016-07-25 – 2016-07-27 (×4): 500 mg via ORAL
  Filled 2016-07-25 (×4): qty 1

## 2016-07-25 MED ORDER — MORPHINE SULFATE (PF) 4 MG/ML IV SOLN
4.0000 mg | Freq: Once | INTRAVENOUS | Status: AC
Start: 2016-07-25 — End: 2016-07-25
  Administered 2016-07-25: 4 mg via INTRAVENOUS
  Filled 2016-07-25: qty 1

## 2016-07-25 MED ORDER — ONDANSETRON HCL 4 MG/2ML IJ SOLN: 4.0000 mg | Freq: Four times a day (QID) | INTRAMUSCULAR | Status: DC | PRN

## 2016-07-25 MED ORDER — ENOXAPARIN SODIUM 40 MG/0.4ML ~~LOC~~ SOLN
40.0000 mg | SUBCUTANEOUS | Status: DC
  Administered 2016-07-25 – 2016-07-26 (×2): 40 mg via SUBCUTANEOUS
  Filled 2016-07-25 (×2): qty 0.4

## 2016-07-25 MED ORDER — SODIUM CHLORIDE 0.9 % IV BOLUS (SEPSIS)
1000.0000 mL | Freq: Once | INTRAVENOUS | Status: AC
  Administered 2016-07-25: 1000 mL via INTRAVENOUS

## 2016-07-25 MED ORDER — DIPHENHYDRAMINE HCL 50 MG/ML IJ SOLN: 12.5000 mg | Freq: Four times a day (QID) | INTRAMUSCULAR | Status: DC | PRN

## 2016-07-25 NOTE — ED Notes (Signed)
Pt going to D30 with husband until she has a room ready

## 2016-07-25 NOTE — ED Triage Notes (Signed)
Pt. report mid abdominal pain with nausea and emesis onset this week , no diarrhea or fever .

## 2016-07-25 NOTE — ED Notes (Signed)
General surgery at bedside. 

## 2016-07-25 NOTE — ED Notes (Signed)
PT.IS BACK FROM CT SCAN.BACK ON MONITOR

## 2016-07-25 NOTE — ED Notes (Signed)
Cornett, MD paged for pt request of nicotine patch

## 2016-07-25 NOTE — H&P (Signed)
Anita Villarreal is an 46 y.o. female.   Chief Complaint: Abdominal pain HPI: Asked to see the patient at the request of Dr. Stark Jock for abdominal pain. Patient relates 2 day history of epigastric abdominal pain with nausea and vomiting. Last bowel movement was yesterday. She came in last night the emergency room but underweight 7 hours to be seen. She feels better currently. She still complains of epigastric abdominal pain. She had a previous laparotomy in November 2016. This was for perforated gastric ulcer. She recovered with this was complicated by intra-abdominal abscess. She felt well until 2 days ago she developed epigastric abdominal pain nausea and vomiting. CT scan shows multiple small incisional hernia along her. There is some question mild small bowel thickening. Currently, her abdominal pain is improved but not resolved.  Past Medical History:  Diagnosis Date  . Abscess of abdominal wall 06/2015  . Asthma   . Bipolar 1 disorder ([place])   . Gastric ulcer with perforation ([place]) 06/2015  . Headache   . 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 Renal insufficiency     Past Surgical History:  Procedure Laterality Date  . APPENDECTOMY    . CHOLECYSTECTOMY    . EXPLORATORY LAPAROTOMY  06/04/15   Phillip Heal patch repair of gastric ulcer  . LAPAROTOMY N/A 06/04/2015   Procedure: EXPLORATORY LAPAROTOMY WITH REPAIROF GASTRIC ULCER AND BIOPSY OF GASTRIC ULCER;  Surgeon: Erroll Luna, MD;  Location: [place];  Service: General;  Laterality: N/A;  . perforated bowel      Family History  Problem Relation Age of Onset  . Diabetes Mother   . CAD Mother   . Aneurysm Maternal Grandmother    Social History:  reports that she has been smoking Cigarettes.  She has a 7.50 pack-year smoking history. She has never used smokeless tobacco. She reports that she does not drink alcohol or use drugs.  Allergies:  Allergies  Allergen Reactions  . Erythromycin Other (See Comments)    Tongue swelling   . Sulfa  Antibiotics     swelling     (Not in a hospital admission)  Results for orders placed or performed during the hospital encounter of 07/25/16 (from the past 48 hour(s))  Lipase, blood     Status: None   Collection Time: 07/25/16 12:11 AM  Result Value Ref Range   Lipase 22 11 - 51 U/L  Comprehensive metabolic panel     Status: Abnormal   Collection Time: 07/25/16 12:11 AM  Result Value Ref Range   Sodium 140 135 - 145 mmol/L   Potassium 3.7 3.5 - 5.1 mmol/L   Chloride 107 101 - 111 mmol/L   CO2 22 22 - 32 mmol/L   Glucose, Bld 100 (H) 65 - 99 mg/dL   BUN 10 6 - 20 mg/dL   Creatinine, Ser 0.80 0.44 - 1.00 mg/dL   Calcium 9.9 8.9 - 10.3 mg/dL   Total Protein 7.7 6.5 - 8.1 g/dL   Albumin 4.1 3.5 - 5.0 g/dL   AST 21 15 - 41 U/L   ALT 14 14 - 54 U/L   Alkaline Phosphatase 107 38 - 126 U/L   Total Bilirubin 0.4 0.3 - 1.2 mg/dL   GFR calc non Af Amer >60 >60 mL/min   GFR calc Af Amer >60 >60 mL/min    Comment: (NOTE) The eGFR has been calculated using the CKD EPI equation. This calculation has not been validated in all clinical situations. eGFR's persistently <60 mL/min signify possible Chronic Kidney  Disease.    Anion gap 11 5 - 15  CBC     Status: None   Collection Time: 07/25/16 12:11 AM  Result Value Ref Range   WBC 10.2 4.0 - 10.5 K/uL   RBC 4.33 3.87 - 5.11 MIL/uL   Hemoglobin 13.3 12.0 - 15.0 g/dL   HCT 39.4 36.0 - 46.0 %   MCV 91.0 78.0 - 100.0 fL   MCH 30.7 26.0 - 34.0 pg   MCHC 33.8 30.0 - 36.0 g/dL   RDW 15.1 11.5 - 15.5 %   Platelets 392 150 - 400 K/uL  hCG, quantitative, pregnancy     Status: None   Collection Time: 07/25/16 12:11 AM  Result Value Ref Range   hCG, Beta Chain, Quant, S 2 <5 mIU/mL    Comment:          GEST. AGE      CONC.  (mIU/mL)   <=1 WEEK        5 - 50     2 WEEKS       50 - 500     3 WEEKS       100 - 10,000     4 WEEKS     1,000 - 30,000     5 WEEKS     3,500 - 115,000   6-8 WEEKS     12,000 - 270,000    12 WEEKS     15,000 -  220,000        FEMALE AND NON-PREGNANT FEMALE:     LESS THAN 5 mIU/mL   Urinalysis, Routine w reflex microscopic     Status: Abnormal   Collection Time: 07/25/16 12:12 AM  Result Value Ref Range   Color, Urine YELLOW YELLOW   APPearance HAZY (A) CLEAR   Specific Gravity, Urine 1.029 1.005 - 1.030   pH 5.0 5.0 - 8.0   Glucose, UA NEGATIVE NEGATIVE mg/dL   Hgb urine dipstick SMALL (A) NEGATIVE   Bilirubin Urine NEGATIVE NEGATIVE   Ketones, ur NEGATIVE NEGATIVE mg/dL   Protein, ur 30 (A) NEGATIVE mg/dL   Nitrite NEGATIVE NEGATIVE   Leukocytes, UA TRACE (A) NEGATIVE   RBC / HPF 0-5 0 - 5 RBC/hpf   WBC, UA 6-30 0 - 5 WBC/hpf   Bacteria, UA RARE (A) NONE SEEN   Squamous Epithelial / LPF 6-30 (A) NONE SEEN   Mucous PRESENT    Hyaline Casts, UA PRESENT    Non Squamous Epithelial 0-5 (A) NONE SEEN   Ct Abdomen Pelvis W Contrast  Result Date: 07/25/2016 CLINICAL DATA:  Upper epigastric pain, vomiting for 2 days EXAM: CT ABDOMEN AND PELVIS WITH CONTRAST TECHNIQUE: Multidetector CT imaging of the abdomen and pelvis was performed using the standard protocol following bolus administration of intravenous contrast. CONTRAST:  141m ISOVUE-300 IOPAMIDOL (ISOVUE-300) INJECTION 61% COMPARISON:  06/28/2015 FINDINGS: Lower chest: No acute abnormality. Hepatobiliary: No focal liver abnormality is seen. Status post cholecystectomy. No biliary dilatation. Pancreas: Unremarkable. No pancreatic ductal dilatation or surrounding inflammatory changes. Spleen: Normal in size without focal abnormality. Adrenals/Urinary Tract: Adrenal glands are unremarkable. Kidneys are normal, without renal calculi, focal lesion, or hydronephrosis. Bladder is unremarkable. Stomach/Bowel: Stomach is within normal limits. Prior appendectomy. No evidence of bowel wall thickening, distention, or inflammatory changes. Multiple fluid-filled loops of small bowel. Vascular/Lymphatic: Normal caliber abdominal aorta. No lymphadenopathy.  Reproductive: Uterus and bilateral adnexa are unremarkable. Other: Wide-mouth supraumbilical midline hernia measuring 6 cm in diameter containing nondilated transverse colon. Slightly more inferiorly  there is a left paramedian supraumbilical hernia containing a loop of small bowel with mild bowel wall thickening. Even more inferiorly there is a right paramedian supraumbilical hernia containing small bowel with mild wall thickening, but without bowel dilatation. No abdominal or pelvic free fluid. Mesenteric nodule measuring 2.2 x 1.5 cm in the left mid abdomen with a second adjacent 17 x 10 mm nodule. Mild nodularity scattered throughout the left upper mesentery. Musculoskeletal: No acute osseous abnormality. No lytic or sclerotic osseous lesion. IMPRESSION: 1. Wide-mouth supraumbilical midline hernia measuring 6 cm in diameter containing nondilated transverse colon. Slightly more inferiorly there is a left paramedian supraumbilical hernia containing a loop of small bowel with mild bowel wall thickening which may reflect incarcerated hernia. Even more inferiorly there is a right paramedian supraumbilical hernia containing small bowel with mild bowel wall thickening and without bowel dilatation. Multiple fluid-filled loops of small bowel which may reflect enteritis versus partial small bowel obstruction or ileus given the umbilical hernias. 2. Mesenteric nodule measuring 2.2 x 1.5 cm in the left mid abdomen with a second adjacent 17 x 10 mm nodule. Mild nodularity scattered throughout the left upper mesentery. Overall appearance is significantly improved compared with 06/28/2015. Electronically Signed   By: Kathreen Devoid   On: 07/25/2016 09:31    Review of Systems  Constitutional: Positive for chills and fever.  HENT: Positive for hearing loss.   Eyes: Negative for blurred vision.  Respiratory: Positive for cough and hemoptysis.   Cardiovascular: Positive for chest pain. Negative for palpitations.    Gastrointestinal: Positive for abdominal pain, nausea and vomiting. Negative for constipation and diarrhea.  Genitourinary: Negative.   Musculoskeletal: Negative.   Skin: Positive for rash.  Neurological: Negative.   Psychiatric/Behavioral: Positive for depression.    Blood pressure 111/75, pulse 69, temperature 98.5 F (36.9 C), temperature source Oral, resp. rate 17, height 5' 8.5" (1.74 m), weight 116.3 kg (256 lb 6 oz), last menstrual period 04/04/2015, SpO2 98 %. Physical Exam  Constitutional: She is oriented to person, place, and time. She appears well-developed and well-nourished.  HENT:  Head: Normocephalic and atraumatic.  Eyes: EOM are normal. Pupils are equal, round, and reactive to light.  Neck: Normal range of motion.  Cardiovascular: Normal rate and regular rhythm.   Respiratory: Effort normal and breath sounds normal.  GI: She exhibits no distension and no mass. There is tenderness. There is no rebound and no guarding.    Musculoskeletal: Normal range of motion.  Neurological: She is alert and oriented to person, place, and time.  Skin: Skin is warm.  Psychiatric: She has a normal mood and affect. Her behavior is normal.    Incision soft. No palpable hernia noted. No rebound or guarding or signs of incarceration.  Assessment/Plan Incisional hernia reducible on exam.  Possible partial low-grade small bowel obstruction. This seems to be improving with bowel rest and IV fluids.  We'll admit for IV fluid hydration and repeat her films the morning. If she is improved her diet can be advanced she can possibly be discharged home with outpatient follow-up since her hernia are reducible. If she does not improve, she may need laparotomy and repair of her incisional hernia during this admission.0  Dandrea Medders A., MD 07/25/2016, 12:32 PM

## 2016-07-25 NOTE — ED Notes (Signed)
Pt called for reassessment, no response 

## 2016-07-26 ENCOUNTER — Inpatient Hospital Stay (HOSPITAL_COMMUNITY): Payer: Medicaid Other

## 2016-07-26 LAB — CBC
HCT: 34.2 % — ABNORMAL LOW (ref 36.0–46.0)
Hemoglobin: 11.3 g/dL — ABNORMAL LOW (ref 12.0–15.0)
MCH: 30.2 pg (ref 26.0–34.0)
MCHC: 33 g/dL (ref 30.0–36.0)
MCV: 91.4 fL (ref 78.0–100.0)
PLATELETS: 322 10*3/uL (ref 150–400)
RBC: 3.74 MIL/uL — AB (ref 3.87–5.11)
RDW: 15.4 % (ref 11.5–15.5)
WBC: 7.2 10*3/uL (ref 4.0–10.5)

## 2016-07-26 LAB — BASIC METABOLIC PANEL
Anion gap: 5 (ref 5–15)
BUN: 7 mg/dL (ref 6–20)
CO2: 21 mmol/L — ABNORMAL LOW (ref 22–32)
CREATININE: 0.76 mg/dL (ref 0.44–1.00)
Calcium: 8.2 mg/dL — ABNORMAL LOW (ref 8.9–10.3)
Chloride: 114 mmol/L — ABNORMAL HIGH (ref 101–111)
Glucose, Bld: 104 mg/dL — ABNORMAL HIGH (ref 65–99)
POTASSIUM: 3.8 mmol/L (ref 3.5–5.1)
SODIUM: 140 mmol/L (ref 135–145)

## 2016-07-26 NOTE — Progress Notes (Signed)
Subjective: Feels better today. No vomiting overnight  Objective: Vital signs in last 24 hours: Temp:  [97.7 F (36.5 C)-98.4 F (36.9 C)] 98 F (36.7 C) (01/21 0500) Pulse Rate:  [61-69] 69 (01/21 0500) Resp:  [17] 17 (01/21 0500) BP: (98-111)/(63-76) 107/65 (01/21 0500) SpO2:  [98 %-100 %] 100 % (01/21 0500) Weight:  [119.1 kg (262 lb 9.1 oz)] 119.1 kg (262 lb 9.1 oz) (01/20 1525) Last BM Date: 07/23/16  Intake/Output from previous day: 01/20 0701 - 01/21 0700 In: 1104.2 [I.V.:1104.2] Out: -  Intake/Output this shift: No intake/output data recorded.  Resp: clear to auscultation bilaterally Cardio: regular rate and rhythm GI: soft, nontender. good bs  Lab Results:   Recent Labs  07/25/16 1259 07/26/16 0439  WBC 7.2 7.2  HGB 12.2 11.3*  HCT 36.6 34.2*  PLT 341 322   BMET  Recent Labs  07/25/16 0011 07/26/16 0439  NA 140 140  K 3.7 3.8  CL 107 114*  CO2 22 21*  GLUCOSE 100* 104*  BUN 10 7  CREATININE 0.80 0.76  CALCIUM 9.9 8.2*   PT/INR No results for input(s): LABPROT, INR in the last 72 hours. ABG No results for input(s): PHART, HCO3 in the last 72 hours.  Invalid input(s): PCO2, PO2  Studies/Results: Ct Abdomen Pelvis W Contrast  Result Date: 07/25/2016 CLINICAL DATA:  Upper epigastric pain, vomiting for 2 days EXAM: CT ABDOMEN AND PELVIS WITH CONTRAST TECHNIQUE: Multidetector CT imaging of the abdomen and pelvis was performed using the standard protocol following bolus administration of intravenous contrast. CONTRAST:  ISOVUE-300 IOPAMIDOL (ISOVUE-300) INJECTION 61% COMPARISON:  06/28/2015 FINDINGS: Lower chest: No acute abnormality. Hepatobiliary: No focal liver abnormality is seen. Status post cholecystectomy. No biliary dilatation. Pancreas: Unremarkable. No pancreatic ductal dilatation or surrounding inflammatory changes. Spleen: Normal in size without focal abnormality. Adrenals/Urinary Tract: Adrenal glands are unremarkable. Kidneys  are normal, without renal calculi, focal lesion, or hydronephrosis. Bladder is unremarkable. Stomach/Bowel: Stomach is within normal limits. Prior appendectomy. No evidence of bowel wall thickening, distention, or inflammatory changes. Multiple fluid-filled loops of small bowel. Vascular/Lymphatic: Normal caliber abdominal aorta. No lymphadenopathy. Reproductive: Uterus and bilateral adnexa are unremarkable. Other: Wide-mouth supraumbilical midline hernia measuring 6 cm in diameter containing nondilated transverse colon. Slightly more inferiorly there is a left paramedian supraumbilical hernia containing a loop of small bowel with mild bowel wall thickening. Even more inferiorly there is a right paramedian supraumbilical hernia containing small bowel with mild wall thickening, but without bowel dilatation. No abdominal or pelvic free fluid. Mesenteric nodule measuring 2.2 x 1.5 cm in the left mid abdomen with a second adjacent 17 x 10 mm nodule. Mild nodularity scattered throughout the left upper mesentery. Musculoskeletal: No acute osseous abnormality. No lytic or sclerotic osseous lesion. IMPRESSION: 1. Wide-mouth supraumbilical midline hernia measuring 6 cm in diameter containing nondilated transverse colon. Slightly more inferiorly there is a left paramedian supraumbilical hernia containing a loop of small bowel with mild bowel wall thickening which may reflect incarcerated hernia. Even more inferiorly there is a right paramedian supraumbilical hernia containing small bowel with mild bowel wall thickening and without bowel dilatation. Multiple fluid-filled loops of small bowel which may reflect enteritis versus partial small bowel obstruction or ileus given the umbilical hernias. 2. Mesenteric nodule measuring 2.2 x 1.5 cm in the left mid abdomen with a second adjacent 17 x 10 mm nodule. Mild nodularity scattered throughout the left upper mesentery. Overall appearance is significantly improved compared with  06/28/2015. Electronically Signed  By: Elige KoHetal  Patel   On: 07/25/2016 09:31    Anti-infectives: Anti-infectives    None      Assessment/Plan: s/p * No surgery found * Advance diet. Allow clears today Xray shows lots of gas in left colon. No obvious obstruction ambulate  LOS: 1 day    TOTH III,PAUL S 07/26/2016

## 2016-07-27 MED ORDER — POLYETHYLENE GLYCOL 3350 17 G PO PACK
17.0000 g | PACK | Freq: Two times a day (BID) | ORAL | Status: DC
  Administered 2016-07-27: 17 g via ORAL
  Filled 2016-07-27: qty 1

## 2016-07-27 MED ORDER — POLYETHYLENE GLYCOL 3350 17 G PO PACK: 17.0000 g | PACK | Freq: Every day | ORAL | 0 refills | Status: AC | PRN

## 2016-07-27 NOTE — Discharge Summary (Signed)
[facility] Discharge Summary   Patient ID: Anita BellMelanie Villarreal MRN: [id number] DOB/AGE: Jan 29, 1971 46 y.o.  Admit date: 07/25/2016 Discharge date: 07/27/2016  Admitting Diagnosis: Incisional hernia  Discharge Diagnosis Patient Active Problem List   Diagnosis Date Noted  . Incisional hernia 07/25/2016  . Postoperative ileus (HCC) 06/27/2015  . Gastric leak 06/27/2015  . Anemia of chronic disease 06/27/2015  . Hypokalemia 06/17/2015  . HCAP (healthcare-associated pneumonia) 06/17/2015  . Pneumonia 06/17/2015  . Intra-abdominal infection   . Abdominal abscess (HCC)   . Bipolar I disorder, most recent episode mixed (HCC) 06/07/2015  . Acute gastric perforation (HCC) 06/04/2015    Consultants none  Imaging: 07/25/16 CT ABD PELVIS:  1. Wide-mouth supraumbilical midline hernia measuring 6 cm in diameter containing nondilated transverse colon. Slightly more inferiorly there is a left paramedian supraumbilical hernia containing a loop of small bowel with mild bowel wall thickening which may reflect incarcerated hernia. Even more inferiorly there is a right paramedian supraumbilical hernia containing small bowel with mild bowel wall thickening and without bowel dilatation. Multiple fluid-filled loops of small bowel which may reflect enteritis versus partial small bowel obstruction or ileus given the umbilical hernias. 2. Mesenteric nodule measuring 2.2 x 1.5 cm in the left mid abdomen with a second adjacent 17 x 10 mm nodule. Mild nodularity scattered throughout the left upper mesentery. Overall appearance is significantly improved compared with 06/28/2015.  Dg Abd 1 View  Result Date: 07/26/2016 CLINICAL DATA:  Upper mid abdominal pain, nausea and vomiting for 1 week EXAM: ABDOMEN - 1 VIEW COMPARISON:  None. FINDINGS: There are surgical clips in the right upper quadrant from prior cholecystectomy. There is no bowel dilatation to suggest obstruction. There is no  evidence of pneumoperitoneum, portal venous gas or pneumatosis. There are no pathologic calcifications along the expected course of the ureters. The osseous structures are unremarkable. IMPRESSION: No evidence bowel obstruction. Electronically Signed   By: Elige KoHetal  Patel   On: 07/26/2016 09:45    Procedures None  Hospital Course:  46 year-old female with a medical history of exploratory laparotomy in 2017 for perforated gastric ulcer who [facility] with 48 hours of epigastric abdominal pain associated with nausea and vomiting.  Workup showed multiple reducible incisional hernias along her laparotomy incision. Patient admitted for conservative management and pain control. Nausea and vomiting resolved with IVF and bowel rest. Abdominal pain improved significantly. Diet was advanced as tolerated to full liquids/soft. On hospital day #2, the patients abdominal pain was controlled, she was tolerating diet, having flatus, abdomen soft and hernia reducible.  Patient will follow up in our office with Dr. Luisa Hartornett to discuss elective hernia repair and knows to call with questions or concerns.     Allergies as of 07/27/2016      Reactions   Nsaids Other (See Comments)   BLEEDING   Erythromycin Other (See Comments)   Tongue swelling    Sulfa Antibiotics Swelling   Swelling of the tongue      Medication List    TAKE these medications   albuterol 108 (90 Base) MCG/ACT inhaler Commonly known as:  PROVENTIL HFA;VENTOLIN HFA Inhale 2 puffs into the lungs every 6 (six) hours as needed for wheezing.   albuterol (2.5 MG/3ML) 0.083% nebulizer solution Commonly known as:  PROVENTIL Take 2.5 mg by nebulization every 6 (six) hours as needed for wheezing.   budesonide-formoterol 80-4.5 MCG/ACT inhaler Commonly known as:  SYMBICORT Inhale 2 puffs into the lungs 2 (two) times daily.  dicyclomine 20 MG tablet Commonly known as:  BENTYL Take 20 mg by mouth 4 (four) times daily.   hydrOXYzine 50 MG  tablet Commonly known as:  ATARAX/VISTARIL Take 50 mg by mouth at bedtime.   lithium carbonate 300 MG capsule Take 600 mg by mouth every morning.   nicotine 14 mg/24hr patch Commonly known as:  NICODERM CQ - dosed in mg/24 hours Place 14 mg onto the skin daily.   polyethylene glycol packet Commonly known as:  MIRALAX / GLYCOLAX Take 17 g by mouth daily as needed.   promethazine 25 MG tablet Commonly known as:  PHENERGAN Take 25-50 mg by mouth See admin instructions. Along with each dose of Imitrex as needed for nausea accompanying migraines   SUMAtriptan 100 MG tablet Commonly known as:  IMITREX Take 100 mg by mouth See admin instructions. Once as needed for migraine (may repeat once in 2 hours if no relief)   ziprasidone 80 MG capsule Commonly known as:  GEODON Take 320 mg by mouth at bedtime.        Follow-up Information    CORNETT,THOMAS A., MD. Schedule an appointment as soon as possible for a visit.   Specialty:  General Surgery Why:  for consultation regarding surgical repair of your incisional hernia. Contact information: [address] [place] [address] [phone]           Signed: Hosie Spangle, [facility] 07/27/2016, 3:46 PM Pager: [phone] Consults: [phone] Mon-Fri 7:00 am-4:30 pm Sat-Sun 7:00 am-11:30 am

## 2016-07-27 NOTE — Discharge Instructions (Signed)
Low-Fiber Diet °Fiber is found in fruits, vegetables, and whole grains. A low-fiber diet restricts fibrous foods that are not digested in the small intestine. A diet containing about 10-15 grams of fiber per day is considered low fiber. Low-fiber diets may be used to: °· Promote healing and rest the bowel during intestinal flare-ups. °· Prevent blockage of a partially obstructed or narrowed gastrointestinal tract. °· Reduce fecal weight and volume. °· Slow the movement of feces. ° °You may be on a low-fiber diet as a transitional diet following surgery, after an injury (trauma), or because of a short (acute) or lifelong (chronic) illness. Your health care provider will determine the length of time you need to stay on this diet. °What do I need to know about a low-fiber diet? °Always check the fiber content on the packaging's Nutrition Facts label, especially on foods from the grains list. Ask your dietitian if you have questions about specific foods that are related to your condition, especially if the food is not listed below. In general, a low-fiber food will have less than 2 g of fiber. °What foods can I eat? °Grains °All breads and crackers made with white flour. Sweet rolls, doughnuts, waffles, pancakes, French toast, bagels. Pretzels, Melba toast, zwieback. Well-cooked cereals, such as cornmeal, farina, or cream cereals. Dry cereals that do not contain whole grains, fruit, or nuts, such as refined corn, wheat, rice, and oat cereals. Potatoes prepared any way without skins, plain pastas and noodles, refined white rice. Use white flour for baking and making sauces. Use allowed list of grains for casseroles, dumplings, and puddings. °Vegetables °Strained tomato and vegetable juices. Fresh lettuce, cucumber, spinach. Well-cooked (no skin or pulp) or canned vegetables, such as asparagus, bean sprouts, beets, carrots, green beans, mushrooms, potatoes, pumpkin, spinach, yellow squash, tomato sauce/puree, turnips,  yams, and zucchini. Keep servings limited to ½ cup. °Fruits °All fruit juices except prune juice. Cooked or canned fruits without skin and seeds, such as applesauce, apricots, cherries, fruit cocktail, grapefruit, grapes, mandarin oranges, melons, peaches, pears, pineapple, and plums. Fresh fruits without skin, such as apricots, avocados, bananas, melons, pineapple, nectarines, and peaches. Keep servings limited to ½ cup or 1 piece. °Meat and Other Protein Sources °Ground or well-cooked tender beef, ham, veal, lamb, pork, or poultry. Eggs, plain cheese. Fish, oysters, shrimp, lobster, and other seafood. Liver, organ meats. Smooth nut butters. °Dairy °All milk products and alternative dairy substitutes, such as soy, rice, almond, and coconut, not containing added whole nuts, seeds, or added fruit. °Beverages °Decaf coffee, fruit, and vegetable juices or smoothies (small amounts, with no pulp or skins, and with fruits from allowed list), sports drinks, herbal tea. °Condiments °Ketchup, mustard, vinegar, cream sauce, cheese sauce, cocoa powder. Spices in moderation, such as allspice, basil, bay leaves, celery powder or leaves, cinnamon, cumin powder, curry powder, ginger, mace, marjoram, onion or garlic powder, oregano, paprika, parsley flakes, ground pepper, rosemary, sage, savory, tarragon, thyme, and turmeric. °Sweets and Desserts °Plain cakes and cookies, pie made with allowed fruit, pudding, custard, cream pie. Gelatin, fruit, ice, sherbet, frozen ice pops. Ice cream, ice milk without nuts. Plain hard candy, honey, jelly, molasses, syrup, sugar, chocolate syrup, gumdrops, marshmallows. Limit overall sugar intake. °Fats and Oil °Margarine, butter, cream, mayonnaise, salad oils, plain salad dressings made from allowed foods. Choose healthy fats such as olive oil, canola oil, and omega-3 fatty acids (such as found in salmon or tuna) when possible. °Other °Bouillon, broth, or cream soups made from allowed foods. Any    strained soup. Casseroles or mixed dishes made with allowed foods. °The items listed above may not be a complete list of recommended foods or beverages. Contact your dietitian for more options. °What foods are not recommended? °Grains °All whole wheat and whole grain breads and crackers. Multigrains, rye, bran seeds, nuts, or coconut. Cereals containing whole grains, multigrains, bran, coconut, nuts, raisins. Cooked or dry oatmeal, steel-cut oats. Coarse wheat cereals, granola. Cereals advertised as high fiber. Potato skins. Whole grain pasta, wild or brown rice. Popcorn. Coconut flour. Bran, buckwheat, corn bread, multigrains, rye, wheat germ. °Vegetables °Fresh, cooked or canned vegetables, such as artichokes, asparagus, beet greens, broccoli, Brussels sprouts, cabbage, celery, cauliflower, corn, eggplant, kale, legumes or beans, okra, peas, and tomatoes. Avoid large servings of any vegetables, especially raw vegetables. °Fruits °Fresh fruits, such as apples with or without skin, berries, cherries, figs, grapes, grapefruit, guavas, kiwis, mangoes, oranges, papayas, pears, persimmons, pineapple, and pomegranate. Prune juice and juices with pulp, stewed or dried prunes. Dried fruits, dates, raisins. Fruit seeds or skins. Avoid large servings of all fresh fruits. °Meats and Other Protein Sources °Tough, fibrous meats with gristle. Chunky nut butter. Cheese made with seeds, nuts, or other foods not recommended. Nuts, seeds, legumes (beans, including baked beans), dried peas, beans, lentils. °Dairy °Yogurt or cheese that contains nuts, seeds, or added fruit. °Beverages °Fruit juices with high pulp, prune juice. Caffeinated coffee and teas. °Condiments °Coconut, maple syrup, pickles, olives. °Sweets and Desserts °Desserts, cookies, or candies that contain nuts or coconut, chunky peanut butter, dried fruits. Jams, preserves with seeds, marmalade. Large amounts of sugar and sweets. Any other dessert made with fruits from  the not recommended list. °Other °Soups made from vegetables that are not recommended or that contain other foods not recommended. °The items listed above may not be a complete list of foods and beverages to avoid. Contact your dietitian for more information. °This information is not intended to replace advice given to you by your health care provider. Make sure you discuss any questions you have with your health care provider. °Document Released: 12/12/2001 Document Revised: 11/28/2015 Document Reviewed: 05/15/2013 °Elsevier Interactive Patient Education © 2017 [employer] ° °

## 2016-07-27 NOTE — Progress Notes (Signed)
D/C papers gone over with pt. Prescription given to pt. IV taken out. nO questions/complaints. Pt. D/c'd successfully.

## 2016-07-27 NOTE — Progress Notes (Signed)
[facility]     Subjective: Denies abdominal pain. Tolerating clear liquids. +flatus. Last BM was Thursday 1/18  Objective: Vital signs in last 24 hours: Temp:  [98.1 F (36.7 C)-98.4 F (36.9 C)] 98.1 F (36.7 C) (01/22 0511) Pulse Rate:  [65-67] 65 (01/22 0511) Resp:  [17] 17 (01/22 0511) BP: (111-112)/(63-74) 111/63 (01/22 0511) SpO2:  [99 %-100 %] 100 % (01/22 0511) Last BM Date: 07/23/16  Intake/Output from previous day: 01/21 0701 - 01/22 0700 In: 4203.3 [P.O.:1320; I.V.:2883.3] Out: 2050 [Urine:2050] Intake/Output this shift: No intake/output data recorded.  PE: Gen:  Alert, NAD, pleasant Card:  RRR, no M/G/R heard Pulm:  CTA, no W/R/R Abd: Soft, obese, non-tender, mild distention, +BS, previous laparotomy scar with reducible incisional hernia. No overlying erythema. Ext:  No erythema, edema, or tenderness   Lab Results:   Recent Labs  07/25/16 1259 07/26/16 0439  WBC 7.2 7.2  HGB 12.2 11.3*  HCT 36.6 34.2*  PLT 341 322   BMET  Recent Labs  07/25/16 0011 07/26/16 0439  NA 140 140  K 3.7 3.8  CL 107 114*  CO2 22 21*  GLUCOSE 100* 104*  BUN 10 7  CREATININE 0.80 0.76  CALCIUM 9.9 8.2*   PT/INR No results for input(s): LABPROT, INR in the last 72 hours. CMP     Component Value Date/Time   NA 140 07/26/2016 0439   K 3.8 07/26/2016 0439   CL 114 (H) 07/26/2016 0439   CO2 21 (L) 07/26/2016 0439   GLUCOSE 104 (H) 07/26/2016 0439   BUN 7 07/26/2016 0439   CREATININE 0.76 07/26/2016 0439   CALCIUM 8.2 (L) 07/26/2016 0439   PROT 7.7 07/25/2016 0011   ALBUMIN 4.1 07/25/2016 0011   AST 21 07/25/2016 0011   ALT 14 07/25/2016 0011   ALKPHOS 107 07/25/2016 0011   BILITOT 0.4 07/25/2016 0011   GFRNONAA >60 07/26/2016 0439   GFRAA >60 07/26/2016 0439   Lipase     Component Value Date/Time   LIPASE 22 07/25/2016 0011       Studies/Results: Dg Abd 1 View  Result Date: 07/26/2016 CLINICAL DATA:  Upper mid  abdominal pain, nausea and vomiting for 1 week EXAM: ABDOMEN - 1 VIEW COMPARISON:  None. FINDINGS: There are surgical clips in the right upper quadrant from prior cholecystectomy. There is no bowel dilatation to suggest obstruction. There is no evidence of pneumoperitoneum, portal venous gas or pneumatosis. There are no pathologic calcifications along the expected course of the ureters. The osseous structures are unremarkable. IMPRESSION: No evidence bowel obstruction. Electronically Signed   By: Elige KoHetal  Patel   On: 07/26/2016 09:45    Anti-infectives: Anti-infectives    None      Assessment/Plan Incisional hernia  - [facility] exploratory laparotomy w/ repair of gastric ulcer 06/04/15 Dr. Luisa Hartornett - presented with abdominal pain, nausea, and emesis on 1/20. Resolved with bowel rest and IVF - hernia is soft and reducible  - tolerating clear liquid diet  Plan: advance diet to full liquids. Miralax. If patient tolerates without s/s of obstruction will discharge home this afternoon with outpatient follow up for elective hernia repair by Dr. Harriette Bouillonhomas Cornett.    LOS: 2 days    Adam PhenixElizabeth S Simaan , [facility] 07/27/2016, 9:43 AM Pager: [phone] Consults: [phone] Mon-Fri 7:00 am-4:30 pm Sat-Sun 7:00 am-11:30 am

## 2016-07-29 NOTE — ED Provider Notes (Signed)
[facility]-EMERGENCY DEPT Provider Note   CSN: [id number] Arrival date & time: 07/24/16  2345     History   Chief Complaint Chief Complaint  Patient presents with  . Abdominal Pain    HPI Anita Villarreal is a 46 y.o. female.  HPI Patient presents to the emergency department with abdominal pain that started 2 days ago.  The patient states things got worse over that time.  Patient also has had nausea and vomiting She states that she has had perforated ulcer in the past.  She states that this pain has progressively gotten worse.  She states nothing seems make the condition better.  Palpation makes the pain worse take any medications prior to arrival. The patient denies chest pain, shortness of breath, headache,blurred vision, neck pain, fever, cough, weakness, numbness, dizziness, anorexia, edema,diarrhea, rash, back pain, dysuria, hematemesis, bloody stool, near syncope, or syncope. Past Medical History:  Diagnosis Date  . Abscess of abdominal wall 06/2015  . Asthma   . Bipolar 1 disorder (HCC)   . Gastric ulcer with perforation (HCC) 06/2015  . Headache   . Manic depressive disorder (HCC)   . Renal insufficiency     Patient Active Problem List   Diagnosis Date Noted  . Incisional hernia 07/25/2016  . Postoperative ileus (HCC) 06/27/2015  . Gastric leak 06/27/2015  . Anemia of chronic disease 06/27/2015  . Hypokalemia 06/17/2015  . HCAP (healthcare-associated pneumonia) 06/17/2015  . Pneumonia 06/17/2015  . Intra-abdominal infection   . Abdominal abscess (HCC)   . Bipolar I disorder, most recent episode mixed (HCC) 06/07/2015  . Acute gastric perforation (HCC) 06/04/2015    Past Surgical History:  Procedure Laterality Date  . APPENDECTOMY    . CHOLECYSTECTOMY    . EXPLORATORY LAPAROTOMY  06/04/15   Cheree DittoGraham patch repair of gastric ulcer  . LAPAROTOMY N/A 06/04/2015   Procedure: EXPLORATORY LAPAROTOMY WITH REPAIROF GASTRIC ULCER AND BIOPSY OF GASTRIC ULCER;  Surgeon:  Harriette Bouillonhomas Cornett, MD;  Location: [facility] OR;  Service: General;  Laterality: N/A;  . perforated bowel      OB History    No data available       Home Medications    Prior to Admission medications   Medication Sig Start Date End Date Taking? Authorizing Provider  albuterol (PROVENTIL HFA;VENTOLIN HFA) 108 (90 BASE) MCG/ACT inhaler Inhale 2 puffs into the lungs every 6 (six) hours as needed for wheezing.    Yes Historical Provider, MD  albuterol (PROVENTIL) (2.5 MG/3ML) 0.083% nebulizer solution Take 2.5 mg by nebulization every 6 (six) hours as needed for wheezing.   Yes Historical Provider, MD  budesonide-formoterol (SYMBICORT) 80-4.5 MCG/ACT inhaler Inhale 2 puffs into the lungs 2 (two) times daily.   Yes Historical Provider, MD  dicyclomine (BENTYL) 20 MG tablet Take 20 mg by mouth 4 (four) times daily.   Yes Historical Provider, MD  hydrOXYzine (ATARAX/VISTARIL) 50 MG tablet Take 50 mg by mouth at bedtime.   Yes Historical Provider, MD  lithium carbonate 300 MG capsule Take 600 mg by mouth every morning.    Yes Historical Provider, MD  nicotine (NICODERM CQ - DOSED IN MG/24 HOURS) 14 mg/24hr patch Place 14 mg onto the skin daily. 04/02/16 04/02/17 Yes Historical Provider, MD  promethazine (PHENERGAN) 25 MG tablet Take 25-50 mg by mouth See admin instructions. Along with each dose of Imitrex as needed for nausea accompanying migraines 04/07/13  Yes Historical Provider, MD  SUMAtriptan (IMITREX) 100 MG tablet Take 100 mg by mouth See admin instructions.  Once as needed for migraine (may repeat once in 2 hours if no relief) 12/20/14  Yes Historical Provider, MD  ziprasidone (GEODON) 80 MG capsule Take 320 mg by mouth at bedtime.    Yes Historical Provider, MD  polyethylene glycol (MIRALAX / GLYCOLAX) packet Take 17 g by mouth daily as needed. 07/27/16   Adam Phenix, PA-C    Family History Family History  Problem Relation Age of Onset  . Diabetes Mother   . CAD Mother   . Aneurysm Maternal  Grandmother     Social History Social History  Substance Use Topics  . Smoking status: Current Every Day Smoker    Packs/day: 0.50    Years: 15.00    Types: Cigarettes  . Smokeless tobacco: Never Used  . Alcohol use No     Allergies   Nsaids; Erythromycin; and Sulfa antibiotics   Review of Systems Review of Systems All other systems negative except as documented in the HPI. All pertinent positives and negatives as reviewed in the HPI.  Physical Exam Updated Vital Signs BP 120/77 (BP Location: Left Arm)   Pulse 62   Temp 98.4 F (36.9 C) (Oral)   Resp 17   Ht 5\' 8"  (1.727 m)   Wt 119.1 kg   LMP 04/04/2015 Comment: neg hcg- tubes tied  SpO2 100%   BMI 39.92 kg/m   Physical Exam  Constitutional: She is oriented to person, place, and time. She appears well-developed and well-nourished. No distress.  HENT:  Head: Normocephalic and atraumatic.  Mouth/Throat: Oropharynx is clear and moist.  Eyes: Pupils are equal, round, and reactive to light.  Neck: Normal range of motion. Neck supple.  Cardiovascular: Normal rate, regular rhythm and normal heart sounds.  Exam reveals no gallop and no friction rub.   No murmur heard. Pulmonary/Chest: Effort normal and breath sounds normal. No respiratory distress. She has no wheezes.  Abdominal: Soft. Bowel sounds are normal. She exhibits no distension. There is no tenderness.    Neurological: She is alert and oriented to person, place, and time. She exhibits normal muscle tone. Coordination normal.  Skin: Skin is warm and dry. No rash noted. No erythema.  Psychiatric: She has a normal mood and affect. Her behavior is normal.  Nursing note and vitals reviewed.    ED Treatments / Results  Labs (all labs ordered are listed, but only abnormal results are displayed) Labs Reviewed  COMPREHENSIVE METABOLIC PANEL - Abnormal; Notable for the following:       Result Value   Glucose, Bld 100 (*)    All other components within normal  limits  URINALYSIS, ROUTINE W REFLEX MICROSCOPIC - Abnormal; Notable for the following:    APPearance HAZY (*)    Hgb urine dipstick SMALL (*)    Protein, ur 30 (*)    Leukocytes, UA TRACE (*)    Bacteria, UA RARE (*)    Squamous Epithelial / LPF 6-30 (*)    Non Squamous Epithelial 0-5 (*)    All other components within normal limits  BASIC METABOLIC PANEL - Abnormal; Notable for the following:    Chloride 114 (*)    CO2 21 (*)    Glucose, Bld 104 (*)    Calcium 8.2 (*)    All other components within normal limits  CBC - Abnormal; Notable for the following:    RBC 3.74 (*)    Hemoglobin 11.3 (*)    HCT 34.2 (*)    All other components within normal limits  LIPASE, BLOOD  CBC  HCG, QUANTITATIVE, PREGNANCY  CBC    EKG  EKG Interpretation None       Radiology No results found.  Procedures Procedures (including critical care time)  Medications Ordered in ED Medications  sodium chloride 0.9 % bolus 1,000 mL (0 mLs Intravenous Stopped 07/25/16 1153)  morphine 4 MG/ML injection 4 mg (4 mg Intravenous Given 07/25/16 0849)  iopamidol (ISOVUE-300) 61 % injection (100 mLs  Contrast Given 07/25/16 0856)  morphine 4 MG/ML injection 4 mg (4 mg Intravenous Given 07/25/16 1232)     Initial Impression / Assessment and Plan / ED Course  I have reviewed the triage vital signs and the nursing notes.  Pertinent labs & imaging results that were available during my care of the patient were reviewed by me and considered in my medical decision making (see chart for details).     I spoke with Dr. Luisa Hart of  General surgery who will come and evaluate the patient.  Patient is advised plan and all questions were answered.  She given IV fluids and pain control in the emergency department  Final Clinical Impressions(s) / ED Diagnoses   Final diagnoses:  Abdominal pain  Pain of upper abdomen  Incisional hernia, without obstruction or gangrene    New Prescriptions Discharge Medication  List as of 07/27/2016  6:11 PM    START taking these medications   Details  polyethylene glycol (MIRALAX / GLYCOLAX) packet Take 17 g by mouth daily as needed., Starting Mon 07/27/2016, OTC         Charlestine Night, PA-C 07/29/16 [id number]    Geoffery Lyons, MD 07/30/16 [phone]

## 2016-07-31 ENCOUNTER — Emergency Department (HOSPITAL_COMMUNITY): Payer: Medicaid Other

## 2016-07-31 ENCOUNTER — Inpatient Hospital Stay (HOSPITAL_COMMUNITY)
Admission: EM | Admit: 2016-07-31 | Discharge: 2016-08-02 | DRG: 395 | Disposition: A | Discharge status: Home / Self Care | Payer: Medicaid Other | Attending: General Surgery | Admitting: General Surgery

## 2016-07-31 ENCOUNTER — Encounter (HOSPITAL_COMMUNITY): Payer: Self-pay | Admitting: Nurse Practitioner

## 2016-07-31 DIAGNOSIS — Z881 Allergy status to other antibiotic agents status: Secondary | ICD-10-CM | POA: Diagnosis not present

## 2016-07-31 DIAGNOSIS — Z9049 Acquired absence of other specified parts of digestive tract: Secondary | ICD-10-CM

## 2016-07-31 DIAGNOSIS — R1084 Generalized abdominal pain: Secondary | ICD-10-CM

## 2016-07-31 DIAGNOSIS — Z886 Allergy status to analgesic agent status: Secondary | ICD-10-CM | POA: Diagnosis not present

## 2016-07-31 DIAGNOSIS — Z833 Family history of diabetes mellitus: Secondary | ICD-10-CM

## 2016-07-31 DIAGNOSIS — K432 Incisional hernia without obstruction or gangrene: Secondary | ICD-10-CM | POA: Diagnosis not present

## 2016-07-31 DIAGNOSIS — Z8249 Family history of ischemic heart disease and other diseases of the circulatory system: Secondary | ICD-10-CM

## 2016-07-31 DIAGNOSIS — Z882 Allergy status to sulfonamides status: Secondary | ICD-10-CM

## 2016-07-31 DIAGNOSIS — Z8719 Personal history of other diseases of the digestive system: Secondary | ICD-10-CM

## 2016-07-31 DIAGNOSIS — F319 Bipolar disorder, unspecified: Secondary | ICD-10-CM | POA: Diagnosis present

## 2016-07-31 DIAGNOSIS — J45909 Unspecified asthma, uncomplicated: Secondary | ICD-10-CM | POA: Diagnosis present

## 2016-07-31 DIAGNOSIS — R112 Nausea with vomiting, unspecified: Secondary | ICD-10-CM | POA: Diagnosis present

## 2016-07-31 DIAGNOSIS — F17210 Nicotine dependence, cigarettes, uncomplicated: Secondary | ICD-10-CM | POA: Diagnosis present

## 2016-07-31 LAB — COMPREHENSIVE METABOLIC PANEL
ALK PHOS: 107 U/L (ref 38–126)
ALT: 25 U/L (ref 14–54)
ANION GAP: 10 (ref 5–15)
AST: 33 U/L (ref 15–41)
Albumin: 3.9 g/dL (ref 3.5–5.0)
BILIRUBIN TOTAL: 0.5 mg/dL (ref 0.3–1.2)
BUN: 6 mg/dL (ref 6–20)
CALCIUM: 9.6 mg/dL (ref 8.9–10.3)
CO2: 23 mmol/L (ref 22–32)
CREATININE: 1.01 mg/dL — AB (ref 0.44–1.00)
Chloride: 107 mmol/L (ref 101–111)
Glucose, Bld: 107 mg/dL — ABNORMAL HIGH (ref 65–99)
Potassium: 3.5 mmol/L (ref 3.5–5.1)
Sodium: 140 mmol/L (ref 135–145)
TOTAL PROTEIN: 7.7 g/dL (ref 6.5–8.1)

## 2016-07-31 LAB — CBC
HCT: 40.1 % (ref 36.0–46.0)
HEMOGLOBIN: 13.9 g/dL (ref 12.0–15.0)
MCH: 31.1 pg (ref 26.0–34.0)
MCHC: 34.7 g/dL (ref 30.0–36.0)
MCV: 89.7 fL (ref 78.0–100.0)
PLATELETS: 357 10*3/uL (ref 150–400)
RBC: 4.47 MIL/uL (ref 3.87–5.11)
RDW: 15.2 % (ref 11.5–15.5)
WBC: 8.8 10*3/uL (ref 4.0–10.5)

## 2016-07-31 LAB — LIPASE, BLOOD: LIPASE: 23 U/L (ref 11–51)

## 2016-07-31 MED ORDER — KCL IN DEXTROSE-NACL 20-5-0.45 MEQ/L-%-% IV SOLN
INTRAVENOUS | Status: DC
  Administered 2016-07-31 – 2016-08-01 (×3): via INTRAVENOUS
  Filled 2016-07-31 (×3): qty 1000

## 2016-07-31 MED ORDER — SODIUM CHLORIDE 0.9 % IV BOLUS (SEPSIS)
1000.0000 mL | Freq: Once | INTRAVENOUS | Status: AC
  Administered 2016-07-31: 1000 mL via INTRAVENOUS

## 2016-07-31 MED ORDER — HYDROXYZINE HCL 25 MG PO TABS
50.0000 mg | ORAL_TABLET | Freq: Every day | ORAL | Status: DC
  Administered 2016-07-31 – 2016-08-01 (×2): 50 mg via ORAL
  Filled 2016-07-31 (×3): qty 2

## 2016-07-31 MED ORDER — DIPHENHYDRAMINE HCL 50 MG/ML IJ SOLN: 25.0000 mg | Freq: Four times a day (QID) | INTRAMUSCULAR | Status: DC | PRN

## 2016-07-31 MED ORDER — ALBUTEROL SULFATE HFA 108 (90 BASE) MCG/ACT IN AERS: 2.0000 | INHALATION_SPRAY | Freq: Four times a day (QID) | RESPIRATORY_TRACT | Status: DC | PRN

## 2016-07-31 MED ORDER — ALBUTEROL SULFATE (2.5 MG/3ML) 0.083% IN NEBU
2.5000 mg | INHALATION_SOLUTION | Freq: Four times a day (QID) | RESPIRATORY_TRACT | Status: DC | PRN
  Administered 2016-08-01: 2.5 mg via RESPIRATORY_TRACT
  Filled 2016-07-31: qty 3

## 2016-07-31 MED ORDER — PANTOPRAZOLE SODIUM 40 MG IV SOLR
40.0000 mg | Freq: Every day | INTRAVENOUS | Status: DC
  Administered 2016-07-31: 40 mg via INTRAVENOUS
  Filled 2016-07-31: qty 40

## 2016-07-31 MED ORDER — ONDANSETRON 4 MG PO TBDP: 4.0000 mg | ORAL_TABLET | Freq: Four times a day (QID) | ORAL | Status: DC | PRN

## 2016-07-31 MED ORDER — ONDANSETRON HCL 4 MG/2ML IJ SOLN
4.0000 mg | Freq: Four times a day (QID) | INTRAMUSCULAR | Status: DC | PRN
  Administered 2016-07-31 – 2016-08-01 (×3): 4 mg via INTRAVENOUS
  Filled 2016-07-31 (×3): qty 2

## 2016-07-31 MED ORDER — NICOTINE 14 MG/24HR TD PT24
14.0000 mg | MEDICATED_PATCH | Freq: Every day | TRANSDERMAL | Status: DC
  Administered 2016-07-31 – 2016-08-02 (×3): 14 mg via TRANSDERMAL
  Filled 2016-07-31 (×3): qty 1

## 2016-07-31 MED ORDER — ZIPRASIDONE HCL 80 MG PO CAPS
320.0000 mg | ORAL_CAPSULE | Freq: Every day | ORAL | Status: DC
  Administered 2016-07-31 – 2016-08-01 (×2): 320 mg via ORAL
  Filled 2016-07-31 (×2): qty 4

## 2016-07-31 MED ORDER — ONDANSETRON HCL 4 MG/2ML IJ SOLN
4.0000 mg | Freq: Once | INTRAMUSCULAR | Status: AC
Start: 2016-07-31 — End: 2016-07-31
  Administered 2016-07-31: 4 mg via INTRAVENOUS
  Filled 2016-07-31: qty 2

## 2016-07-31 MED ORDER — LITHIUM CARBONATE 300 MG PO CAPS
600.0000 mg | ORAL_CAPSULE | Freq: Every morning | ORAL | Status: DC
  Administered 2016-08-01 – 2016-08-02 (×2): 600 mg via ORAL
  Filled 2016-07-31 (×2): qty 2

## 2016-07-31 MED ORDER — DIPHENHYDRAMINE HCL 25 MG PO CAPS: 25.0000 mg | ORAL_CAPSULE | Freq: Four times a day (QID) | ORAL | Status: DC | PRN

## 2016-07-31 MED ORDER — DICYCLOMINE HCL 20 MG PO TABS
20.0000 mg | ORAL_TABLET | Freq: Four times a day (QID) | ORAL | Status: DC
  Administered 2016-07-31 – 2016-08-02 (×6): 20 mg via ORAL
  Filled 2016-07-31 (×6): qty 1

## 2016-07-31 MED ORDER — PROMETHAZINE HCL 25 MG/ML IJ SOLN
12.5000 mg | INTRAMUSCULAR | Status: DC | PRN
  Administered 2016-07-31 – 2016-08-01 (×2): 12.5 mg via INTRAVENOUS
  Filled 2016-07-31 (×2): qty 1

## 2016-07-31 MED ORDER — SUMATRIPTAN SUCCINATE 100 MG PO TABS
100.0000 mg | ORAL_TABLET | Freq: Once | ORAL | Status: DC | PRN
  Filled 2016-07-31: qty 1

## 2016-07-31 MED ORDER — FENTANYL CITRATE (PF) 100 MCG/2ML IJ SOLN
25.0000 ug | INTRAMUSCULAR | Status: DC | PRN
  Administered 2016-07-31 – 2016-08-02 (×14): 25 ug via INTRAVENOUS
  Filled 2016-07-31 (×14): qty 2

## 2016-07-31 NOTE — ED Notes (Signed)
Lab called to add on lipase.

## 2016-07-31 NOTE — H&P (Signed)
Anita Villarreal is an 46 y.o. female.   Chief Complaint: Nausea and vomiting HPI: Anita Villarreal was recently admitted for an incisional hernia and partial small bowel obstruction. Her hernia was reducible. She was treated medically and improved. She was discharged home on 1/22. She reports doing okay for 2 days but then developed nausea and vomiting on 1/24. This has persisted with some intermittent abdominal pain since then. She returned to the emergency room today for further evaluation. Laboratory studies are unremarkable. Her abdominal x-ray is unchanged from prior to discharge. I was asked to see her for admission. Last BM was 1/24.  Past Medical History:  Diagnosis Date  . Abscess of abdominal wall 06/2015  . Asthma   . Bipolar 1 disorder ([place])   . Gastric ulcer with perforation ([place]) 06/2015  . Headache   . Manic depressive disorder ([place])   . Renal insufficiency     Past Surgical History:  Procedure Laterality Date  . APPENDECTOMY    . CHOLECYSTECTOMY    . EXPLORATORY LAPAROTOMY  06/04/15   Phillip Heal patch repair of gastric ulcer  . LAPAROTOMY N/A 06/04/2015   Procedure: EXPLORATORY LAPAROTOMY WITH REPAIROF GASTRIC ULCER AND BIOPSY OF GASTRIC ULCER;  Surgeon: Erroll Luna, MD;  Location: [facility];  Service: General;  Laterality: N/A;  . perforated bowel      Family History  Problem Relation Age of Onset  . Diabetes Mother   . CAD Mother   . Aneurysm Maternal Grandmother    Social History:  reports that she has been smoking Cigarettes.  She has a 7.50 pack-year smoking history. She has never used smokeless tobacco. She reports that she drinks alcohol. She reports that she does not use drugs.  Allergies:  Allergies  Allergen Reactions  . Nsaids Other (See Comments)    BLEEDING (internally)  . Erythromycin Other (See Comments)    Tongue swelling   . Sulfa Antibiotics Swelling    Swelling of the tongue     (Not in a hospital admission)  Results for orders placed or  performed during the hospital encounter of 07/31/16 (from the past 48 hour(s))  Comprehensive metabolic panel     Status: Abnormal   Collection Time: 07/31/16  2:20 PM  Result Value Ref Range   Sodium 140 135 - 145 mmol/L   Potassium 3.5 3.5 - 5.1 mmol/L   Chloride 107 101 - 111 mmol/L   CO2 23 22 - 32 mmol/L   Glucose, Bld 107 (H) 65 - 99 mg/dL   BUN 6 6 - 20 mg/dL   Creatinine, Ser 1.01 (H) 0.44 - 1.00 mg/dL   Calcium 9.6 8.9 - 10.3 mg/dL   Total Protein 7.7 6.5 - 8.1 g/dL   Albumin 3.9 3.5 - 5.0 g/dL   AST 33 15 - 41 U/L   ALT 25 14 - 54 U/L   Alkaline Phosphatase 107 38 - 126 U/L   Total Bilirubin 0.5 0.3 - 1.2 mg/dL   GFR calc non Af Amer >60 >60 mL/min   GFR calc Af Amer >60 >60 mL/min    Comment: (NOTE) The eGFR has been calculated using the CKD EPI equation. This calculation has not been validated in all clinical situations. eGFR's persistently <60 mL/min signify possible Chronic Kidney Disease.    Anion gap 10 5 - 15  CBC     Status: None   Collection Time: 07/31/16  2:20 PM  Result Value Ref Range   WBC 8.8 4.0 - 10.5 K/uL   RBC  4.47 3.87 - 5.11 MIL/uL   Hemoglobin 13.9 12.0 - 15.0 g/dL   HCT 40.1 36.0 - 46.0 %   MCV 89.7 78.0 - 100.0 fL   MCH 31.1 26.0 - 34.0 pg   MCHC 34.7 30.0 - 36.0 g/dL   RDW 15.2 11.5 - 15.5 %   Platelets 357 150 - 400 K/uL  Lipase, blood     Status: None   Collection Time: 07/31/16  2:20 PM  Result Value Ref Range   Lipase 23 11 - 51 U/L   Dg Abdomen 1 View  Result Date: 07/31/2016 CLINICAL DATA:  Low-grade small bowel obstruction with worsening pain. Exploratory laparotomy 06/04/2015. EXAM: ABDOMEN - 1 VIEW COMPARISON:  07/26/2016 FINDINGS: There are surgical clips over the right upper quadrant. Air is present throughout the colon. Air-filled somewhat featureless loop in the midline upper abdomen measuring 5.7 cm in diameter without significant change. This may represent distal stomach versus a dilated small bowel loop in the region  patient's known abdominal wall hernia. Remainder of the exam is unchanged. IMPRESSION: Air-filled prominent bowel loop in the midline upper abdomen without significant change as it is difficult to assess whether this represents distal stomach versus a small bowel loop associated with patient's known abdominal wall hernia. Recommend follow-up as clinically indicated. Electronically Signed   By: Marin Olp M.D.   On: 07/31/2016 17:49    Review of Systems  Constitutional: Negative for chills and fever.  HENT: Negative.   Eyes: Negative for blurred vision.  Respiratory: Positive for wheezing. Negative for cough and shortness of breath.   Cardiovascular: Negative for chest pain.  Gastrointestinal: Positive for abdominal pain, nausea and vomiting.  Genitourinary: Negative.   Musculoskeletal: Negative.   Skin: Negative.   Neurological: Negative.   Endo/Heme/Allergies: Negative.   Psychiatric/Behavioral:       Bipolar    Blood pressure 121/72, pulse 64, temperature 98.6 F (37 C), temperature source Oral, resp. rate 18, last menstrual period 04/04/2015, SpO2 97 %. Physical Exam  Constitutional: She is oriented to person, place, and time. She appears well-developed and well-nourished. No distress.  HENT:  Head: Normocephalic.  Right Ear: External ear normal.  Left Ear: External ear normal.  Nose: Nose normal.  Mouth/Throat: Oropharynx is clear and moist.  Eyes:  Wearing colored contact lenses  Neck: Neck supple. No tracheal deviation present.  Cardiovascular: Normal rate and normal heart sounds.   Respiratory: Effort normal. No stridor. No respiratory distress. She has wheezes. She has no rales. She exhibits no tenderness.  GI: Soft. She exhibits distension. There is no tenderness. There is no rebound and no guarding.  Midline incisional hernia reduces easily without significant discomfort, no generalized tenderness, no peritoneal signs, bowel sounds are hypoactive  Musculoskeletal:  Normal range of motion.  Neurological: She is alert and oriented to person, place, and time. She exhibits normal muscle tone.  Skin: Skin is warm.  Psychiatric:  Abnormal flat affect     Assessment/Plan Nausea and vomiting/reducible incisional hernia - will readmit, give IV fluids and antiemetics. We'll hold off on NG tube and less she continues to vomit. No need for emergent surgery. Bipolar disorder - home medications Asthma - home bronchodilators  Zenovia Jarred, MD 07/31/2016, 6:31 PM

## 2016-07-31 NOTE — ED Provider Notes (Signed)
[facility]-EMERGENCY DEPT Provider Note  CSN: [id number] Arrival date & time: 07/31/16  1343  History   Chief Complaint Chief Complaint  Patient presents with  . Abdominal Pain   HPI Anita Villarreal is a 46 y.o. female.  The history is provided by the patient and medical records. No language interpreter was used.  Illness  This is a recurrent problem. The current episode started more than 2 days ago. The problem occurs constantly. The problem has been gradually worsening. Associated symptoms include abdominal pain. Pertinent negatives include no chest pain, no headaches and no shortness of breath. The symptoms are aggravated by eating. Nothing relieves the symptoms.    Past Medical History:  Diagnosis Date  . Abscess of abdominal wall 06/2015  . Asthma   . Bipolar 1 disorder ([facility])   . Gastric ulcer with perforation ([facility]) 06/2015  . Headache   . Manic depressive disorder ([facility])   . Renal insufficiency    Patient Active Problem List   Diagnosis Date Noted  . Nausea & vomiting 07/31/2016  . Incisional hernia 07/25/2016  . Postoperative ileus ([facility]) 06/27/2015  . Gastric leak 06/27/2015  . Anemia of chronic disease 06/27/2015  . Hypokalemia 06/17/2015  . HCAP (healthcare-associated pneumonia) 06/17/2015  . Pneumonia 06/17/2015  . Intra-abdominal infection   . Abdominal abscess ([facility])   . Bipolar I disorder, most recent episode mixed ([facility]) 06/07/2015  . Acute gastric perforation ([facility]) 06/04/2015   Past Surgical History:  Procedure Laterality Date  . APPENDECTOMY    . CHOLECYSTECTOMY    . EXPLORATORY LAPAROTOMY  06/04/15   Cheree DittoGraham patch repair of gastric ulcer  . LAPAROTOMY N/A 06/04/2015   Procedure: EXPLORATORY LAPAROTOMY WITH REPAIROF GASTRIC ULCER AND BIOPSY OF GASTRIC ULCER;  Surgeon: Harriette Bouillonhomas Cornett, MD;  Location: [facility];  Service: General;  Laterality: N/A;  . perforated bowel     OB History    No data available      Home Medications    Prior to Admission  medications   Medication Sig Start Date End Date Taking? Authorizing Provider  albuterol (PROVENTIL HFA;VENTOLIN HFA) 108 (90 BASE) MCG/ACT inhaler Inhale 2 puffs into the lungs every 6 (six) hours as needed for wheezing.    Yes Historical Provider, MD  albuterol (PROVENTIL) (2.5 MG/3ML) 0.083% nebulizer solution Take 2.5 mg by nebulization every 6 (six) hours as needed for wheezing.   Yes Historical Provider, MD  budesonide-formoterol (SYMBICORT) 80-4.5 MCG/ACT inhaler Inhale 2 puffs into the lungs 2 (two) times daily.   Yes Historical Provider, MD  dicyclomine (BENTYL) 20 MG tablet Take 20 mg by mouth 4 (four) times daily.   Yes Historical Provider, MD  hydrOXYzine (ATARAX/VISTARIL) 50 MG tablet Take 50 mg by mouth at bedtime.   Yes Historical Provider, MD  lithium carbonate 300 MG capsule Take 600 mg by mouth every morning.    Yes Historical Provider, MD  nicotine (NICODERM CQ - DOSED IN MG/24 HOURS) 14 mg/24hr patch Place 14 mg onto the skin daily. 04/02/16 04/02/17 Yes Historical Provider, MD  promethazine (PHENERGAN) 25 MG tablet Take 25-50 mg by mouth See admin instructions. Along with each dose of Imitrex as needed for nausea accompanying migraines 04/07/13  Yes Historical Provider, MD  SUMAtriptan (IMITREX) 100 MG tablet Take 100 mg by mouth See admin instructions. Once as needed for migraine (may repeat once in 2 hours if no relief) 12/20/14  Yes Historical Provider, MD  ziprasidone (GEODON) 80 MG capsule Take 320 mg by mouth at bedtime.  Yes Historical Provider, MD  polyethylene glycol (MIRALAX / GLYCOLAX) packet Take 17 g by mouth daily as needed. Patient not taking: Reported on 07/31/2016 07/27/16   Adam Phenix, PA-C   Family History Family History  Problem Relation Age of Onset  . Diabetes Mother   . CAD Mother   . Aneurysm Maternal Grandmother    Social History Social History  Substance Use Topics  . Smoking status: Current Every Day Smoker    Packs/day: 0.50    Years:  15.00    Types: Cigarettes  . Smokeless tobacco: Never Used  . Alcohol use 0.0 oz/week     Comment: rare    Allergies   Nsaids; Erythromycin; and Sulfa antibiotics  Review of Systems Review of Systems  Constitutional: Positive for appetite change (decreased). Negative for chills and fever.  Respiratory: Negative for shortness of breath.   Cardiovascular: Negative for chest pain.  Gastrointestinal: Positive for abdominal pain, constipation (no BM x2 days), nausea and vomiting (bilious but non-bloody).  Neurological: Negative for headaches.  All other systems reviewed and are negative.   Physical Exam Updated Vital Signs BP 113/67 (BP Location: Left Arm)   Pulse 79   Temp 98.5 F (36.9 C) (Oral)   Resp 18   Ht 5\' 8"  (1.727 m)   Wt 112.9 kg   LMP 04/04/2015 Comment: neg hcg- tubes tied  SpO2 94%   BMI 37.86 kg/m   Physical Exam  Constitutional: She is oriented to person, place, and time. No distress.  Obese middle aged AA female  HENT:  Head: Normocephalic and atraumatic.  Eyes: EOM are normal. Pupils are equal, round, and reactive to light.  Neck: Normal range of motion. Neck supple.  Cardiovascular: Normal rate, regular rhythm and normal heart sounds.   Pulmonary/Chest: Effort normal and breath sounds normal.  Abdominal: Soft. Bowel sounds are normal. She exhibits no distension. There is tenderness (diffuse, worse in epigastrum).  Musculoskeletal: Normal range of motion.  Neurological: She is alert and oriented to person, place, and time.  Skin: Skin is warm and dry. Capillary refill takes less than 2 seconds. She is not diaphoretic.  Nursing note and vitals reviewed.   ED Treatments / Results  Labs (all labs ordered are listed, but only abnormal results are displayed) Labs Reviewed  COMPREHENSIVE METABOLIC PANEL - Abnormal; Notable for the following:       Result Value   Glucose, Bld 107 (*)    Creatinine, Ser 1.01 (*)    All other components within normal  limits  CBC  LIPASE, BLOOD  BASIC METABOLIC PANEL  CBC   EKG  EKG Interpretation None      Radiology Dg Abdomen 1 View  Result Date: 07/31/2016 CLINICAL DATA:  Low-grade small bowel obstruction with worsening pain. Exploratory laparotomy 06/04/2015. EXAM: ABDOMEN - 1 VIEW COMPARISON:  07/26/2016 FINDINGS: There are surgical clips over the right upper quadrant. Air is present throughout the colon. Air-filled somewhat featureless loop in the midline upper abdomen measuring 5.7 cm in diameter without significant change. This may represent distal stomach versus a dilated small bowel loop in the region patient's known abdominal wall hernia. Remainder of the exam is unchanged. IMPRESSION: Air-filled prominent bowel loop in the midline upper abdomen without significant change as it is difficult to assess whether this represents distal stomach versus a small bowel loop associated with patient's known abdominal wall hernia. Recommend follow-up as clinically indicated. Electronically Signed   By: Elberta Fortis M.D.   On: 07/31/2016  17:49   Procedures Procedures (including critical care time)  Medications Ordered in ED Medications  nicotine (NICODERM CQ - dosed in mg/24 hours) patch 14 mg (14 mg Transdermal Patch Applied 07/31/16 2052)  lithium carbonate capsule 600 mg (not administered)  hydrOXYzine (ATARAX/VISTARIL) tablet 50 mg (50 mg Oral Given 07/31/16 2052)  SUMAtriptan (IMITREX) tablet 100 mg (not administered)  ziprasidone (GEODON) capsule 320 mg (320 mg Oral Given 07/31/16 2100)  albuterol (PROVENTIL) (2.5 MG/3ML) 0.083% nebulizer solution 2.5 mg (not administered)  dicyclomine (BENTYL) tablet 20 mg (20 mg Oral Given 07/31/16 2052)  dextrose 5 % and 0.45 % NaCl with KCl 20 mEq/L infusion ( Intravenous New Bag/Given 07/31/16 2044)  fentaNYL (SUBLIMAZE) injection 25 mcg (25 mcg Intravenous Given 07/31/16 2329)  diphenhydrAMINE (BENADRYL) capsule 25 mg (not administered)    Or  diphenhydrAMINE  (BENADRYL) injection 25 mg (not administered)  ondansetron (ZOFRAN-ODT) disintegrating tablet 4 mg ( Oral See Alternative 07/31/16 2044)    Or  ondansetron (ZOFRAN) injection 4 mg (4 mg Intravenous Given 07/31/16 2044)  pantoprazole (PROTONIX) injection 40 mg (40 mg Intravenous Given 07/31/16 2052)  promethazine (PHENERGAN) injection 12.5 mg (12.5 mg Intravenous Given 07/31/16 2329)  sodium chloride 0.9 % bolus 1,000 mL (0 mLs Intravenous Stopped 07/31/16 1903)  ondansetron (ZOFRAN) injection 4 mg (4 mg Intravenous Given 07/31/16 1659)    Initial Impression / Assessment and Plan / ED Course  I have reviewed the triage vital signs and the nursing notes.  46 y.o. female with above stated PMHx, HPI, and physical. PMH of gastric ulcer (perforation in 2016  Labs unremarkable. KUB showing relatively unchanged from time of discahrge. NPO placed. Given IVF's & zofran.  Laboratory and imaging results were personally reviewed by myself and used in the medical decision making of this patient's treatment and disposition.  General surgery consulted and evaluated the patient in the emergency department with recommendations to admit for medical management of probable partial SBO. Pt understands and agrees with the plan and has no further questions or concerns.   Pt care discussed with and followed by my attending, Dr. Junious Dresser, MD Pager [phone]  Final Clinical Impressions(s) / ED Diagnoses   Final diagnoses:  Generalized abdominal pain  Nausea and vomiting in adult  History of small bowel obstruction   New Prescriptions Current Discharge Medication List       Angelina Ok, MD 07/31/16 2354    Laurence Spates, MD 08/02/16 1739

## 2016-07-31 NOTE — ED Triage Notes (Signed)
Pt presents with c/o abd pain. She was admitted last week to the hospital for a hernia and was discharged home on Monday with plans for outpatient surgery. She reports she has continued to have abd pain and vomiting since her discharge home and her surgeon, dr Luisa Hartcornett, instructed her to return to ED today to re-evaluate the hernia.

## 2016-07-31 NOTE — ED Notes (Signed)
Attempted report 

## 2016-08-01 LAB — BASIC METABOLIC PANEL
Anion gap: 7 (ref 5–15)
BUN: 5 mg/dL — AB (ref 6–20)
CHLORIDE: 109 mmol/L (ref 101–111)
CO2: 24 mmol/L (ref 22–32)
CREATININE: 1.02 mg/dL — AB (ref 0.44–1.00)
Calcium: 8.6 mg/dL — ABNORMAL LOW (ref 8.9–10.3)
GFR calc Af Amer: >60 mL/min (ref >60)
GFR calc non Af Amer: >60 mL/min (ref >60)
GLUCOSE: 104 mg/dL — AB (ref 65–99)
POTASSIUM: 3.5 mmol/L (ref 3.5–5.1)
Sodium: 140 mmol/L (ref 135–145)

## 2016-08-01 LAB — CBC
HEMATOCRIT: 34.4 % — AB (ref 36.0–46.0)
Hemoglobin: 11.3 g/dL — ABNORMAL LOW (ref 12.0–15.0)
MCH: 30.1 pg (ref 26.0–34.0)
MCHC: 32.8 g/dL (ref 30.0–36.0)
MCV: 91.5 fL (ref 78.0–100.0)
PLATELETS: 299 10*3/uL (ref 150–400)
RBC: 3.76 MIL/uL — ABNORMAL LOW (ref 3.87–5.11)
RDW: 15.2 % (ref 11.5–15.5)
WBC: 7.6 10*3/uL (ref 4.0–10.5)

## 2016-08-01 MED ORDER — SENNOSIDES-DOCUSATE SODIUM 8.6-50 MG PO TABS
2.0000 | ORAL_TABLET | Freq: Two times a day (BID) | ORAL | Status: DC
  Administered 2016-08-01 – 2016-08-02 (×3): 2 via ORAL
  Filled 2016-08-01 (×3): qty 2

## 2016-08-01 MED ORDER — PANTOPRAZOLE SODIUM 20 MG PO TBEC
20.0000 mg | DELAYED_RELEASE_TABLET | Freq: Every day | ORAL | Status: DC
  Administered 2016-08-01: 20 mg via ORAL
  Filled 2016-08-01: qty 1

## 2016-08-01 NOTE — Progress Notes (Signed)
  Subjective: Stable and alert. Denies pain.  Says she is hungry and wants to eat.  No nausea Last bowel movement 3 days ago but not unusual for her.  Objective: Vital signs in last 24 hours: Temp:  [98 F (36.7 C)-98.9 F (37.2 C)] 98 F (36.7 C) (01/27 0517) Pulse Rate:  [62-79] 62 (01/27 0517) Resp:  [16-18] 17 (01/27 0517) BP: (106-143)/(64-94) 110/64 (01/27 0527) SpO2:  [94 %-99 %] 97 % (01/27 0517) Weight:  [112.9 kg (249 lb)] 112.9 kg (249 lb) (01/26 2026) Last BM Date: 07/30/16  Intake/Output from previous day: 01/26 0701 - 01/27 0700 In: 1626.7 [I.V.:626.7; IV Piggyback:1000] Out: 100 [Urine:100] Intake/Output this shift: Total I/O In: 1626.7 [I.V.:626.7; IV Piggyback:1000] Out: 100 [Urine:100]    General appearance: Alert.  Cooperative.  Somewhat flattened affect.  No distress. Resp: clear to auscultation bilaterally GI:    Abdomen soft.  Not distended.  Nontender.  Bowel sounds present.  Upper midline incision well-healed.  Easily reducible ventral hernia.  Nontender.  No mass.  Lab Results:   Recent Labs  07/31/16 1420  WBC 8.8  HGB 13.9  HCT 40.1  PLT 357   BMET  Recent Labs  07/31/16 1420  NA 140  K 3.5  CL 107  CO2 23  GLUCOSE 107*  BUN 6  CREATININE 1.01*  CALCIUM 9.6   PT/INR No results for input(s): LABPROT, INR in the last 72 hours. ABG No results for input(s): PHART, HCO3 in the last 72 hours.  Invalid input(s): PCO2, PO2  Studies/Results: Dg Abdomen 1 View  Result Date: 07/31/2016 CLINICAL DATA:  Low-grade small bowel obstruction with worsening pain. Exploratory laparotomy 06/04/2015. EXAM: ABDOMEN - 1 VIEW COMPARISON:  07/26/2016 FINDINGS: There are surgical clips over the right upper quadrant. Air is present throughout the colon. Air-filled somewhat featureless loop in the midline upper abdomen measuring 5.7 cm in diameter without significant change. This may represent distal stomach versus a dilated small bowel loop in the  region patient's known abdominal wall hernia. Remainder of the exam is unchanged. IMPRESSION: Air-filled prominent bowel loop in the midline upper abdomen without significant change as it is difficult to assess whether this represents distal stomach versus a small bowel loop associated with patient's known abdominal wall hernia. Recommend follow-up as clinically indicated. Electronically Signed   By: Elberta Fortisaniel  Boyle M.D.   On: 07/31/2016 17:49    Anti-infectives: Anti-infectives    None      Assessment/Plan:  Nausea and vomiting, resolved.  No evidence of acute ischemic, obstructive, inflammatory or neoplastic process No acute surgical need Soft diet Ambulate Discharge 12-24 hrs.  Reducible ventral incisional hernia. Plan follow-up with Dr. Luisa Hartornett for elective repair, as previously discussed.  History laparotomy for perforated ulcer Bipolar 1 disorder-continue home medications Asthma-continue home bronchodilators     LOS: 1 day    Yulitza Shorts M 08/01/2016

## 2016-08-02 NOTE — Discharge Instructions (Signed)
-  see above 

## 2016-08-02 NOTE — Discharge Summary (Signed)
  Patient ID: Anita Villarreal [id number] 45 y.o. 01/25/1971  Admit date: 07/31/2016  Discharge date and time: 08/03/2015  Admitting Physician: Anita Villarreal, Anita  Discharge Physician: Anita Villarreal  Admission Diagnoses: Generalized abdominal pain [R10.84] History of small bowel obstruction [Z87.19] Nausea and vomiting in adult [R11.2]  Discharge Diagnoses: Nausea and vomiting uncertain etiology, resolved                                         Ventral incisional hernia, reducible                                          History laparotomy for perforated ulcer                                          Bipolar disorder                                          Asthma   Operations: None  Admission Condition: fair  Discharged Condition: good  Indication for Admission: : Anita Villarreal was recently admitted for an incisional hernia and partial small bowel obstruction. Her hernia was reducible. She was treated medically and improved. She was discharged home on 1/22. She reports doing okay for 2 days but then developed nausea and vomiting on 1/24. This has persisted with some intermittent abdominal pain since then. She returned to the emergency room today for further evaluation. Laboratory studies are unremarkable. Her abdominal x-ray is unchanged from prior to discharge. I was asked to see her for admission. Last BM was 1/24.  Hospital Course: The patient was evaluated and admitted by Dr. Violeta GelinasBurke Villarreal.  She did not appear to have a surgical problem requiring  immediate intervention.  She improved rapidly overnight and by hospital day #2 was hungry and stated that her abdominal pain and nausea had resolved.  Her diet was advanced.  She tolerated regular diet without nausea or pain.  Physical exam on the day of discharge revealed that she was alert and she was asking to go home.  Her abdomen was soft and nontender.  She was not distended.  She had normal bowel sounds.  Upper midline incision showed  reducible ventral hernia.    She was told to continue her usual medications.  Diet and activities were discussed.  She has an appointment to follow-up with Dr. Harriette Bouillonhomas Villarreal on February 2 discuss elective ventral hernia repair.  Consults: None  Significant Diagnostic Studies: Abdominal x-rays.  Lab work.  Treatments: IV fluids, antiemeticss, analgesics  Disposition: Home  Patient Instructions: Keep appointment with Dr. Harriette Bouillonhomas Villarreal on February 2  Activity: activity as tolerated Diet: low fat, low cholesterol diet Wound Care: none needed  Follow-up:  With Dr. Luisa Villarreal in 1 week.  Signed: Angelia MouldHaywood Villarreal. Derrell Villarreal, Villarreal.D., FACS General and minimally invasive surgery Breast and Colorectal Surgery  08/02/2016, 7:41 AM

## 2016-08-07 ENCOUNTER — Ambulatory Visit: Payer: Self-pay | Admitting: Surgery

## 2016-08-07 NOTE — H&P (Signed)
Anita BellMelanie Villarreal 08/07/2016 10:31 AM Location: [facility] Patient #: [id number] DOB: July 11, 1970 Single / Language: [place] / Race: Black or African American Female  History of Present Illness Anita Villarreal(Anita Villarreal A. Sotero Brinkmeyer MD; 08/07/2016 10:44 AM) Patient words: Patient returns for follow-up of incisional hernia. She is a 2 previous admissions for partial small bowel obstructions in the last 4 weeks abdominal pain from her incisional hernia. She has a history of a laparotomy in November 2016 for perforated gastric ulcer. She is pain-free today but is still having issues at times eating. She is having no further vomiting.  The patient is a 46 year old female.   Allergies Anita Villarreal(Anita [place], [place]; 08/07/2016 10:32 AM) No Known Drug Allergies 07/15/2015  Medication History Anita Villarreal(Anita Villarreal, [place]; 08/07/2016 10:32 AM) Percocet (5-325MG  Tablet, 1 (one) Tablet Oral four times daily, as needed, Taken starting 07/15/2015) Active. Lithium Carbonate (150MG  Capsule, Oral) Active. Albuterol (90MCG/ACT Aerosol Soln, Inhalation) Active. Methocarbamol (500MG  Tablet, Oral) Active. OxyCODONE HCl (7.5MG  Tab Abuse Deter, Oral) Active. Imitrex (25MG  Tablet, Oral) Active. Ziprasidone HCl (80MG  Capsule, Oral) Active. Famotidine (20MG  Tablet, Oral) Active. Ensure Active Light (Oral) Active. Medications Reconciled    Vitals Anita Villarreal(Anita Villarreal CMA; 08/07/2016 10:32 AM) 08/07/2016 10:32 AM Weight: 253.4 lb Height: 68in Body Surface Area: 2.26 m Body Mass Index: 38.53 kg/m  Temp.: 98.38F  Pulse: 99 (Regular)  BP: 112/72 (Sitting, Left Arm, Standard)      Physical Exam (Anita Villarreal A. Anita Wunder MD; 08/07/2016 10:44 AM)  General Mental Status-Alert. General Appearance-Consistent with stated age. Hydration-Well hydrated. Voice-Normal.  Head and Neck Head-normocephalic, atraumatic with no lesions or palpable masses. Trachea-midline. Thyroid Gland Characteristics - normal size and  consistency.  Eye Eyeball - Bilateral-Extraocular movements intact. Sclera/Conjunctiva - Bilateral-No scleral icterus.  Cardiovascular Cardiovascular examination reveals -normal heart sounds, regular rate and rhythm with no murmurs and normal pedal pulses bilaterally.  Abdomen Note: Reducible upper midline incisional hernia. There are multiple small hernias noted. Previous cholecystectomy site noted. Soft nontender nondistended currently.  Neurologic Neurologic evaluation reveals -alert and oriented x 3 with no impairment of recent or remote memory. Mental Status-Normal.  Musculoskeletal Normal Exam - Left-Upper Extremity Strength Normal and Lower Extremity Strength Normal. Normal Exam - Right-Upper Extremity Strength Normal and Lower Extremity Strength Normal.    Assessment & Plan (Anita Daise A. Doyne Micke MD; 08/07/2016 10:45 AM)  INCISIONAL HERNIA, WITHOUT OBSTRUCTION OR GANGRENE (K43.2) Impression: Discussed risk of bleeding, infection, mesh use, chronic pain, chronic wound, organ injury, death, DVT, the further treatment in her procedures, hernia recurrence rates, the need to stop smoking, and exacerbation of underlying medical problems. Also discussed potential bowel injury need further revisional surgery. Discussed potential need to release the transversus abdominis muscle. She would like to proceed due to exacerbation of her symptoms.  Current Plans You are being scheduled for surgery- Our schedulers will call you.  You should hear from our office's scheduling department within 5 working days about the location, date, and time of surgery. We try to make accommodations for patient's preferences in scheduling surgery, but sometimes the OR schedule or the surgeon's schedule prevents us from making those accommodations.  If you have not heard from our office [phone]) in 5 working days, call the office and ask for your surgeon's nurse.  If you have other questions  about your diagnosis, plan, or surgery, call the office and ask for your surgeon's nurse.  The anatomy & physiology of the abdominal wall was discussed. The pathophysiology of hernias was discussed. Natural history risks without surgery  including progeressive enlargement, pain, incarceration, & strangulation was discussed. Contributors to complications such as smoking, obesity, diabetes, prior surgery, etc were discussed.  I feel the risks of no intervention will lead to serious problems that outweigh the operative risks; therefore, I recommended surgery to reduce and repair the hernia. I explained laparoscopic techniques with possible need for an open approach. I noted the probable use of mesh to patch and/or buttress the hernia repair  Risks such as bleeding, infection, abscess, need for further treatment, heart attack, death, and other risks were discussed. I noted a good likelihood this will help address the problem. Goals of post-operative recovery were discussed as well. Possibility that this will not correct all symptoms was explained. I stressed the importance of low-impact activity, aggressive pain control, avoiding constipation, & not pushing through pain to minimize risk of post-operative chronic pain or injury. Possibility of reherniation especially with smoking, obesity, diabetes, immunosuppression, and other health conditions was discussed. We will work to minimize complications.  An educational handout further explaining the pathology & treatment options was given as well. Questions were answered. The patient expresses understanding & wishes to proceed with surgery.  Pt Education - Pamphlet Given - Hernia Surgery: discussed with patient and provided information.

## 2016-08-12 ENCOUNTER — Encounter (HOSPITAL_COMMUNITY): Payer: Self-pay

## 2016-08-12 ENCOUNTER — Encounter (HOSPITAL_COMMUNITY)
Admission: RE | Admit: 2016-08-12 | Discharge: 2016-08-12 | Disposition: A | Discharge status: Home / Self Care | Payer: Medicaid Other | Source: Ambulatory Visit | Attending: Surgery | Admitting: Surgery

## 2016-08-12 ENCOUNTER — Other Ambulatory Visit (HOSPITAL_COMMUNITY): Payer: Self-pay

## 2016-08-12 ENCOUNTER — Other Ambulatory Visit (HOSPITAL_COMMUNITY): Payer: Self-pay | Admitting: *Deleted

## 2016-08-12 DIAGNOSIS — Z01818 Encounter for other preprocedural examination: Secondary | ICD-10-CM

## 2016-08-12 DIAGNOSIS — K432 Incisional hernia without obstruction or gangrene: Secondary | ICD-10-CM

## 2016-08-12 HISTORY — DX: Pneumonia, unspecified organism: J18.9

## 2016-08-12 HISTORY — DX: Anemia, unspecified: D64.9

## 2016-08-12 HISTORY — DX: Cardiac murmur, unspecified: R01.1

## 2016-08-12 LAB — BASIC METABOLIC PANEL
ANION GAP: 8 (ref 5–15)
BUN: 5 mg/dL — AB (ref 6–20)
CHLORIDE: 105 mmol/L (ref 101–111)
CO2: 23 mmol/L (ref 22–32)
Calcium: 9.8 mg/dL (ref 8.9–10.3)
Creatinine, Ser: 1.07 mg/dL — ABNORMAL HIGH (ref 0.44–1.00)
GFR calc Af Amer: >60 mL/min (ref >60)
GFR calc non Af Amer: >60 mL/min (ref >60)
GLUCOSE: 106 mg/dL — AB (ref 65–99)
POTASSIUM: 3.8 mmol/L (ref 3.5–5.1)
Sodium: 136 mmol/L (ref 135–145)

## 2016-08-12 LAB — CBC
HEMATOCRIT: 41.2 % (ref 36.0–46.0)
HEMOGLOBIN: 13.9 g/dL (ref 12.0–15.0)
MCH: 30.5 pg (ref 26.0–34.0)
MCHC: 33.7 g/dL (ref 30.0–36.0)
MCV: 90.4 fL (ref 78.0–100.0)
Platelets: 438 10*3/uL — ABNORMAL HIGH (ref 150–400)
RBC: 4.56 MIL/uL (ref 3.87–5.11)
RDW: 15.3 % (ref 11.5–15.5)
WBC: 14.1 10*3/uL — ABNORMAL HIGH (ref 4.0–10.5)

## 2016-08-12 MED ORDER — DEXTROSE 5 % IV SOLN
3.0000 g | INTRAVENOUS | Status: AC
  Administered 2016-08-13: 3 g via INTRAVENOUS
  Filled 2016-08-12: qty 3000

## 2016-08-12 NOTE — Pre-Procedure Instructions (Addendum)
Alison StallingMelanie R Rottman  08/12/2016      [facility] [place] [address] [place] [address] Phone: [phone] Fax: [phone]  [facility] [address]- [place], [place] - [address] [place] [address] Phone: [phone] Fax: [phone]  [facility] [address] - [place], [place] - [address]  [place] [address] Phone: [phone] Fax: [phone]    Your procedure is scheduled on 08/13/16.  [facility] at 530 [facility].M.  Call this number if you have problems the morning of surgery:  [phone]   Remember:  Do not eat food or drink liquids after midnight.  Take these medicines the morning of surgery with A SIP OF WATER     Inhales/nebulizer if needed(bring albuterol inhaler),dicyclomine(bentyl),lithium,  Imitrex, and/ or phenergan if needed  STOP all herbel meds, nsaids (aleve,naproxen,advil,ibuprofen) starting NOW including all vitamins/supplements   Do not wear jewelry, make-up or nail polish.  Do not wear lotions, powders, or perfumes, or deoderant.  Do not shave 48 hours prior to surgery.  Men may shave face and neck.  Do not bring valuables to the hospital.  [facility] is not responsible for any belongings or valuables.  Contacts, dentures or bridgework may not be worn into surgery.  Leave your suitcase in the car.  After surgery it may be brought to your room.  For patients admitted to the hospital, discharge time will be determined by your treatment team.  Patients discharged the day of surgery will not be allowed to drive home.   Special instructions:   Special Instructions: [place] - Preparing for Surgery  Before surgery, you can play an important role.  Because skin is not sterile, your skin needs to be as free of germs as possible.  You can reduce the number of germs on you skin by washing with CHG (chlorahexidine  gluconate) soap before surgery.  CHG is an antiseptic cleaner which kills germs and bonds with the skin to continue killing germs even after washing.  Please DO NOT use if you have an allergy to CHG or antibacterial soaps.  If your skin becomes reddened/irritated stop using the CHG and inform your nurse when you arrive at Short Stay.  Do not shave (including legs and underarms) for at least 48 hours prior to the first CHG shower.  You may shave your face.  Please follow these instructions carefully:   1.  Shower with CHG Soap the night before surgery and the morning of Surgery.  2.  If you choose to wash your hair, wash your hair first as usual with your normal shampoo.  3.  After you shampoo, rinse your hair and body thoroughly to remove the Shampoo.  4.  Use CHG as you would any other liquid soap.  You can apply chg directly  to the skin and wash gently with scrungie or a clean washcloth.  5.  Apply the CHG Soap to your body ONLY FROM THE NECK DOWN.  Do not use on open wounds or open sores.  Avoid contact with your eyes ears, mouth and genitals (private parts).  Wash genitals (private parts)       with your normal soap.  6.  Wash thoroughly, paying special attention to the area where your surgery will be performed.  7.  Thoroughly rinse your body with warm water from the neck down.  8.  DO NOT shower/wash with your normal soap after using and rinsing off the CHG Soap.  9.  Pat yourself dry with a clean towel.            10.  Wear clean pajamas.            11.  Place clean sheets on your bed the night of your first shower and do not sleep with pets.  Day of Surgery  Do not apply any lotions/deodorants the morning of surgery.  Please wear clean clothes to the hospital/surgery center.  Please read over the  fact sheets that you were given.

## 2016-08-12 NOTE — Progress Notes (Signed)
Anesthesia chart review: Patient is a 46 year old female scheduled for incisional hernia repair with mesh on 08/13/2016 by Dr. Luisa Hartornett.  History includes smoking, Bipolar 1 disorder, renal insufficiency, asthma, murmur (childhood?; ), migraines, anemia, cholecystectomy, appendectomy, exploratory laparotomy with repair of perforated gastric ulcer 06/01/2015, left hip arthroscopic labral repair 05/18/13, right hip arthroscopy with labral repair 01/15/14. BMI is consistent with obesity.  Meds include albuterol, Symbicort, Bentyl, hydroxyzine, lithium carbonate, NicoDerm CQ, Phenergan, Imitrex, Geodon, Ambien.  PCP is Georgette ShellSara Spencer, PA-C with [facility].  BP 120/71   Pulse 95   Temp 37.2 C (Oral)   Resp 18   Ht 5\' 8"  (1.727 m)   Wt 253 lb 5 oz (114.9 kg)   LMP 04/04/2015 Comment: neg hcg- tubes tied  SpO2 98%   BMI 38.52 kg/m   EKG 08/12/16: Normal sinus rhythm, nonspecific T wave abnormality. Overall, I think EKG is stable when compared to 06/17/15 tracing.  Echo 06/18/15: Study Conclusions: - Left ventricle: The cavity size was normal. There was moderate   concentric hypertrophy. Systolic function was normal. The   estimated ejection fraction was in the range of 55% to 60%. Wall   motion was normal; there were no regional wall motion   abnormalities.  Preoperative labs noted. Cr 1.07. WBC 14.1, up from 7.6 on 08/01/16. I have forwarded CBC results to Dr. Luisa Hartornett to review and also left a voice message with the [facility] triage nursing staff. Will defer additional orders, if any, to surgeon. If no acute issues then I would anticipate that she could proceed as planned tomorrow.  Anita Ochsllison Crespin Forstrom, PA-C [facility] Short Stay Center/Anesthesiology Phone [phone] 08/12/2016 2:39 PM

## 2016-08-13 ENCOUNTER — Inpatient Hospital Stay (HOSPITAL_COMMUNITY): Payer: Medicaid Other | Admitting: Anesthesiology

## 2016-08-13 ENCOUNTER — Encounter (HOSPITAL_COMMUNITY): Admission: RE | Disposition: A | Discharge status: Home / Self Care | Payer: Self-pay | Source: Ambulatory Visit

## 2016-08-13 ENCOUNTER — Encounter (HOSPITAL_COMMUNITY): Payer: Self-pay | Admitting: *Deleted

## 2016-08-13 ENCOUNTER — Inpatient Hospital Stay (HOSPITAL_COMMUNITY)
Admission: RE | Admit: 2016-08-13 | Discharge: 2016-08-17 | DRG: 337 | Disposition: A | Discharge status: Home / Self Care | Payer: Medicaid Other | Source: Ambulatory Visit | Attending: General Surgery | Admitting: General Surgery

## 2016-08-13 ENCOUNTER — Inpatient Hospital Stay (HOSPITAL_COMMUNITY): Payer: Medicaid Other | Admitting: Vascular Surgery

## 2016-08-13 DIAGNOSIS — F319 Bipolar disorder, unspecified: Secondary | ICD-10-CM | POA: Diagnosis present

## 2016-08-13 DIAGNOSIS — K432 Incisional hernia without obstruction or gangrene: Secondary | ICD-10-CM | POA: Diagnosis present

## 2016-08-13 DIAGNOSIS — E669 Obesity, unspecified: Secondary | ICD-10-CM | POA: Diagnosis present

## 2016-08-13 DIAGNOSIS — Z6838 Body mass index (BMI) 38.0-38.9, adult: Secondary | ICD-10-CM

## 2016-08-13 DIAGNOSIS — J449 Chronic obstructive pulmonary disease, unspecified: Secondary | ICD-10-CM | POA: Diagnosis present

## 2016-08-13 HISTORY — DX: Chronic obstructive pulmonary disease, unspecified: J44.9

## 2016-08-13 HISTORY — PX: INCISIONAL HERNIA REPAIR: SHX193

## 2016-08-13 HISTORY — PX: INSERTION OF MESH: SHX5868

## 2016-08-13 HISTORY — DX: Partial intestinal obstruction, unspecified as to cause: K56.600

## 2016-08-13 SURGERY — REPAIR, HERNIA, INCISIONAL
Anesthesia: General | Site: Abdomen

## 2016-08-13 MED ORDER — MEPERIDINE HCL 25 MG/ML IJ SOLN: 6.2500 mg | INTRAMUSCULAR | Status: DC | PRN

## 2016-08-13 MED ORDER — ACETAMINOPHEN 650 MG RE SUPP: 650.0000 mg | Freq: Four times a day (QID) | RECTAL | Status: DC | PRN

## 2016-08-13 MED ORDER — ONDANSETRON HCL 4 MG/2ML IJ SOLN
INTRAMUSCULAR | Status: DC | PRN
  Administered 2016-08-13: 4 mg via INTRAVENOUS

## 2016-08-13 MED ORDER — MIDAZOLAM HCL 2 MG/2ML IJ SOLN
INTRAMUSCULAR | Status: AC
  Filled 2016-08-13: qty 2

## 2016-08-13 MED ORDER — ACETAMINOPHEN 325 MG PO TABS
650.0000 mg | ORAL_TABLET | Freq: Four times a day (QID) | ORAL | Status: DC | PRN
  Administered 2016-08-13: 650 mg via ORAL
  Filled 2016-08-13: qty 2

## 2016-08-13 MED ORDER — ONDANSETRON HCL 4 MG/2ML IJ SOLN: 4.0000 mg | Freq: Four times a day (QID) | INTRAMUSCULAR | Status: DC | PRN

## 2016-08-13 MED ORDER — FENTANYL CITRATE (PF) 100 MCG/2ML IJ SOLN
25.0000 ug | INTRAMUSCULAR | Status: DC | PRN
  Administered 2016-08-13 (×3): 50 ug via INTRAVENOUS

## 2016-08-13 MED ORDER — ONDANSETRON 4 MG PO TBDP: 4.0000 mg | ORAL_TABLET | Freq: Four times a day (QID) | ORAL | Status: DC | PRN

## 2016-08-13 MED ORDER — ENOXAPARIN SODIUM 40 MG/0.4ML ~~LOC~~ SOLN
40.0000 mg | SUBCUTANEOUS | Status: DC
  Administered 2016-08-14 – 2016-08-17 (×4): 40 mg via SUBCUTANEOUS
  Filled 2016-08-13 (×4): qty 0.4

## 2016-08-13 MED ORDER — HYDROMORPHONE 1 MG/ML IV SOLN
INTRAVENOUS | Status: DC
  Administered 2016-08-13: 1.2 mg via INTRAVENOUS
  Administered 2016-08-13: 11:00:00 via INTRAVENOUS
  Administered 2016-08-13: 3.3 mg via INTRAVENOUS
  Administered 2016-08-13: 6 mg via INTRAVENOUS
  Administered 2016-08-14 (×2): 1.2 mg via INTRAVENOUS
  Administered 2016-08-14: 2.1 mg via INTRAVENOUS
  Administered 2016-08-14: 2.7 mg via INTRAVENOUS
  Administered 2016-08-14: 1.5 mg via INTRAVENOUS
  Administered 2016-08-14: 0.6 mg via INTRAVENOUS
  Administered 2016-08-15 (×2): 1.5 mg via INTRAVENOUS
  Administered 2016-08-15: 0.3 mg via INTRAVENOUS
  Administered 2016-08-15: 1.8 mg via INTRAVENOUS

## 2016-08-13 MED ORDER — ALBUTEROL SULFATE HFA 108 (90 BASE) MCG/ACT IN AERS: 2.0000 | INHALATION_SPRAY | Freq: Four times a day (QID) | RESPIRATORY_TRACT | Status: DC | PRN

## 2016-08-13 MED ORDER — SIMETHICONE 80 MG PO CHEW: 40.0000 mg | CHEWABLE_TABLET | Freq: Four times a day (QID) | ORAL | Status: DC | PRN

## 2016-08-13 MED ORDER — SUGAMMADEX SODIUM 500 MG/5ML IV SOLN
INTRAVENOUS | Status: DC | PRN
  Administered 2016-08-13: 240 mg via INTRAVENOUS

## 2016-08-13 MED ORDER — KCL IN DEXTROSE-NACL 20-5-0.9 MEQ/L-%-% IV SOLN
INTRAVENOUS | Status: DC
  Administered 2016-08-13 – 2016-08-17 (×9): via INTRAVENOUS
  Filled 2016-08-13 (×11): qty 1000

## 2016-08-13 MED ORDER — ZIPRASIDONE HCL 80 MG PO CAPS
320.0000 mg | ORAL_CAPSULE | Freq: Every day | ORAL | Status: DC
  Administered 2016-08-13 – 2016-08-16 (×4): 320 mg via ORAL
  Filled 2016-08-13 (×5): qty 4

## 2016-08-13 MED ORDER — MOMETASONE FURO-FORMOTEROL FUM 100-5 MCG/ACT IN AERO
2.0000 | INHALATION_SPRAY | Freq: Two times a day (BID) | RESPIRATORY_TRACT | Status: DC
  Administered 2016-08-14 – 2016-08-17 (×7): 2 via RESPIRATORY_TRACT
  Filled 2016-08-13: qty 8.8

## 2016-08-13 MED ORDER — NALOXONE HCL 0.4 MG/ML IJ SOLN: 0.4000 mg | INTRAMUSCULAR | Status: DC | PRN

## 2016-08-13 MED ORDER — DIPHENHYDRAMINE HCL 50 MG/ML IJ SOLN
12.5000 mg | Freq: Four times a day (QID) | INTRAMUSCULAR | Status: DC | PRN
  Administered 2016-08-14: 12.5 mg via INTRAVENOUS

## 2016-08-13 MED ORDER — MIDAZOLAM HCL 5 MG/5ML IJ SOLN
INTRAMUSCULAR | Status: DC | PRN
  Administered 2016-08-13: 2 mg via INTRAVENOUS

## 2016-08-13 MED ORDER — PROPOFOL 10 MG/ML IV BOLUS
INTRAVENOUS | Status: DC | PRN
Start: 2016-08-13 — End: 2016-08-13
  Administered 2016-08-13: 200 mg via INTRAVENOUS

## 2016-08-13 MED ORDER — BUPIVACAINE LIPOSOME 1.3 % IJ SUSP
20.0000 mL | INTRAMUSCULAR | Status: DC
  Filled 2016-08-13: qty 20

## 2016-08-13 MED ORDER — ROCURONIUM BROMIDE 50 MG/5ML IV SOSY
PREFILLED_SYRINGE | INTRAVENOUS | Status: AC
  Filled 2016-08-13: qty 5

## 2016-08-13 MED ORDER — LACTATED RINGERS IV SOLN: INTRAVENOUS | Status: DC

## 2016-08-13 MED ORDER — FENTANYL CITRATE (PF) 100 MCG/2ML IJ SOLN
INTRAMUSCULAR | Status: AC
  Filled 2016-08-13: qty 4

## 2016-08-13 MED ORDER — DIPHENHYDRAMINE HCL 12.5 MG/5ML PO ELIX: 12.5000 mg | ORAL_SOLUTION | Freq: Four times a day (QID) | ORAL | Status: DC | PRN

## 2016-08-13 MED ORDER — HYDRALAZINE HCL 20 MG/ML IJ SOLN: 10.0000 mg | INTRAMUSCULAR | Status: DC | PRN

## 2016-08-13 MED ORDER — CHLORHEXIDINE GLUCONATE CLOTH 2 % EX PADS: 6.0000 | MEDICATED_PAD | Freq: Once | CUTANEOUS | Status: DC

## 2016-08-13 MED ORDER — ONDANSETRON HCL 4 MG/2ML IJ SOLN
INTRAMUSCULAR | Status: AC
  Filled 2016-08-13: qty 2

## 2016-08-13 MED ORDER — SUGAMMADEX SODIUM 500 MG/5ML IV SOLN
INTRAVENOUS | Status: AC
  Filled 2016-08-13: qty 5

## 2016-08-13 MED ORDER — 0.9 % SODIUM CHLORIDE (POUR BTL) OPTIME
TOPICAL | Status: DC | PRN
  Administered 2016-08-13 (×2): 1000 mL

## 2016-08-13 MED ORDER — NICOTINE 21 MG/24HR TD PT24
21.0000 mg | MEDICATED_PATCH | Freq: Every day | TRANSDERMAL | Status: DC
  Administered 2016-08-13 – 2016-08-17 (×5): 21 mg via TRANSDERMAL
  Filled 2016-08-13 (×5): qty 1

## 2016-08-13 MED ORDER — FENTANYL CITRATE (PF) 100 MCG/2ML IJ SOLN
INTRAMUSCULAR | Status: AC
  Filled 2016-08-13: qty 2

## 2016-08-13 MED ORDER — POLYETHYLENE GLYCOL 3350 17 G PO PACK: 17.0000 g | PACK | Freq: Every day | ORAL | Status: DC | PRN

## 2016-08-13 MED ORDER — PANTOPRAZOLE SODIUM 40 MG IV SOLR
40.0000 mg | Freq: Every day | INTRAVENOUS | Status: DC
  Administered 2016-08-13 – 2016-08-15 (×3): 40 mg via INTRAVENOUS
  Filled 2016-08-13 (×3): qty 40

## 2016-08-13 MED ORDER — SUMATRIPTAN SUCCINATE 100 MG PO TABS: 100.0000 mg | ORAL_TABLET | ORAL | Status: DC

## 2016-08-13 MED ORDER — HYDROMORPHONE 1 MG/ML IV SOLN
INTRAVENOUS | Status: AC
  Filled 2016-08-13: qty 25

## 2016-08-13 MED ORDER — ALBUTEROL SULFATE (2.5 MG/3ML) 0.083% IN NEBU: 2.5000 mg | INHALATION_SOLUTION | Freq: Four times a day (QID) | RESPIRATORY_TRACT | Status: DC | PRN

## 2016-08-13 MED ORDER — CEFAZOLIN SODIUM-DEXTROSE 2-4 GM/100ML-% IV SOLN
2.0000 g | Freq: Three times a day (TID) | INTRAVENOUS | Status: AC
  Administered 2016-08-13: 2 g via INTRAVENOUS
  Filled 2016-08-13: qty 100

## 2016-08-13 MED ORDER — DICYCLOMINE HCL 20 MG PO TABS
20.0000 mg | ORAL_TABLET | Freq: Four times a day (QID) | ORAL | Status: DC
  Administered 2016-08-13 – 2016-08-17 (×18): 20 mg via ORAL
  Filled 2016-08-13 (×18): qty 1

## 2016-08-13 MED ORDER — ROCURONIUM BROMIDE 10 MG/ML (PF) SYRINGE
PREFILLED_SYRINGE | INTRAVENOUS | Status: DC | PRN
  Administered 2016-08-13: 40 mg via INTRAVENOUS
  Administered 2016-08-13: 20 mg via INTRAVENOUS
  Administered 2016-08-13 (×3): 10 mg via INTRAVENOUS

## 2016-08-13 MED ORDER — LACTATED RINGERS IV SOLN
INTRAVENOUS | Status: DC | PRN
  Administered 2016-08-13 (×2): via INTRAVENOUS

## 2016-08-13 MED ORDER — METHOCARBAMOL 500 MG PO TABS
500.0000 mg | ORAL_TABLET | Freq: Four times a day (QID) | ORAL | Status: DC | PRN
  Administered 2016-08-13 – 2016-08-17 (×7): 500 mg via ORAL
  Filled 2016-08-13 (×7): qty 1

## 2016-08-13 MED ORDER — DIPHENHYDRAMINE HCL 12.5 MG/5ML PO ELIX
12.5000 mg | ORAL_SOLUTION | Freq: Four times a day (QID) | ORAL | Status: DC | PRN
  Administered 2016-08-14 – 2016-08-15 (×3): 12.5 mg via ORAL
  Administered 2016-08-16: 19:00:00 via ORAL
  Filled 2016-08-13 (×4): qty 10

## 2016-08-13 MED ORDER — SODIUM CHLORIDE 0.9% FLUSH: 9.0000 mL | INTRAVENOUS | Status: DC | PRN

## 2016-08-13 MED ORDER — LITHIUM CARBONATE 300 MG PO CAPS
600.0000 mg | ORAL_CAPSULE | Freq: Two times a day (BID) | ORAL | Status: DC
Start: 2016-08-13 — End: 2016-08-17
  Administered 2016-08-13 – 2016-08-17 (×8): 600 mg via ORAL
  Filled 2016-08-13 (×9): qty 2

## 2016-08-13 MED ORDER — METOCLOPRAMIDE HCL 5 MG/ML IJ SOLN: 10.0000 mg | Freq: Once | INTRAMUSCULAR | Status: DC | PRN

## 2016-08-13 MED ORDER — FENTANYL CITRATE (PF) 100 MCG/2ML IJ SOLN
INTRAMUSCULAR | Status: AC
Start: 2016-08-13 — End: 2016-08-14
  Filled 2016-08-13: qty 2

## 2016-08-13 MED ORDER — BUPIVACAINE HCL (PF) 0.25 % IJ SOLN
INTRAMUSCULAR | Status: AC
  Filled 2016-08-13: qty 30

## 2016-08-13 MED ORDER — SODIUM CHLORIDE 0.9 % IV SOLN
INTRAVENOUS | Status: DC | PRN
  Administered 2016-08-13: 30 mL

## 2016-08-13 MED ORDER — ZOLPIDEM TARTRATE 5 MG PO TABS
5.0000 mg | ORAL_TABLET | Freq: Every day | ORAL | Status: DC
Start: 2016-08-13 — End: 2016-08-17
  Administered 2016-08-14 – 2016-08-16 (×3): 5 mg via ORAL
  Filled 2016-08-13 (×3): qty 1

## 2016-08-13 MED ORDER — LIDOCAINE 2% (20 MG/ML) 5 ML SYRINGE
INTRAMUSCULAR | Status: AC
  Filled 2016-08-13: qty 5

## 2016-08-13 MED ORDER — DIPHENHYDRAMINE HCL 50 MG/ML IJ SOLN
12.5000 mg | Freq: Four times a day (QID) | INTRAMUSCULAR | Status: DC | PRN
  Administered 2016-08-15: 12.5 mg via INTRAVENOUS
  Filled 2016-08-13 (×2): qty 1

## 2016-08-13 MED ORDER — PROPOFOL 10 MG/ML IV BOLUS
INTRAVENOUS | Status: AC
  Filled 2016-08-13: qty 20

## 2016-08-13 MED ORDER — LIDOCAINE 2% (20 MG/ML) 5 ML SYRINGE
INTRAMUSCULAR | Status: DC | PRN
  Administered 2016-08-13: 100 mg via INTRAVENOUS

## 2016-08-13 MED ORDER — HYDROXYZINE HCL 50 MG PO TABS
50.0000 mg | ORAL_TABLET | Freq: Every day | ORAL | Status: DC
Start: 2016-08-13 — End: 2016-08-17
  Administered 2016-08-13 – 2016-08-16 (×4): 50 mg via ORAL
  Filled 2016-08-13 (×5): qty 1

## 2016-08-13 MED ORDER — FENTANYL CITRATE (PF) 100 MCG/2ML IJ SOLN
INTRAMUSCULAR | Status: DC | PRN
  Administered 2016-08-13 (×2): 50 ug via INTRAVENOUS
  Administered 2016-08-13: 100 ug via INTRAVENOUS
  Administered 2016-08-13 (×3): 50 ug via INTRAVENOUS
  Administered 2016-08-13 (×2): 25 ug via INTRAVENOUS

## 2016-08-13 SURGICAL SUPPLY — 48 items
BIOPATCH RED 1 DISK 7.0 (GAUZE/BANDAGES/DRESSINGS) ×2 IMPLANT
BIOPATCH RED 1IN DISK 7.0MM (GAUZE/BANDAGES/DRESSINGS) ×1
BLADE SURG 11 STRL SS (BLADE) ×3 IMPLANT
CANISTER SUCTION 2500CC (MISCELLANEOUS) ×3 IMPLANT
CHLORAPREP W/TINT 26ML (MISCELLANEOUS) ×3 IMPLANT
COVER SURGICAL LIGHT HANDLE (MISCELLANEOUS) ×3 IMPLANT
DERMABOND ADVANCED (GAUZE/BANDAGES/DRESSINGS)
DERMABOND ADVANCED .7 DNX12 (GAUZE/BANDAGES/DRESSINGS) IMPLANT
DEVICE SECURE STRAP 25 ABSORB (INSTRUMENTS) ×3 IMPLANT
DEVICE TROCAR PUNCTURE CLOSURE (ENDOMECHANICALS) ×3 IMPLANT
DRAIN CHANNEL 19F RND (DRAIN) IMPLANT
DRAPE LAPAROSCOPIC ABDOMINAL (DRAPES) ×3 IMPLANT
DRSG OPSITE POSTOP 4X10 (GAUZE/BANDAGES/DRESSINGS) ×3 IMPLANT
DRSG TEGADERM 4X4.75 (GAUZE/BANDAGES/DRESSINGS) ×3 IMPLANT
ELECT CAUTERY BLADE 6.4 (BLADE) ×3 IMPLANT
ELECT REM PT RETURN 9FT ADLT (ELECTROSURGICAL) ×3
ELECTRODE REM PT RTRN 9FT ADLT (ELECTROSURGICAL) ×1 IMPLANT
EVACUATOR SILICONE 100CC (DRAIN) IMPLANT
GLOVE BIO SURGEON STRL SZ8 (GLOVE) ×3 IMPLANT
GLOVE BIOGEL PI IND STRL 8 (GLOVE) ×1 IMPLANT
GLOVE BIOGEL PI INDICATOR 8 (GLOVE) ×2
GOWN STRL REUS W/ TWL LRG LVL3 (GOWN DISPOSABLE) ×4 IMPLANT
GOWN STRL REUS W/ TWL XL LVL3 (GOWN DISPOSABLE) ×1 IMPLANT
GOWN STRL REUS W/TWL LRG LVL3 (GOWN DISPOSABLE) ×8
GOWN STRL REUS W/TWL XL LVL3 (GOWN DISPOSABLE) ×2
KIT BASIN OR (CUSTOM PROCEDURE TRAY) ×3 IMPLANT
KIT ROOM TURNOVER OR (KITS) ×3 IMPLANT
MARKER SKIN SURG 5.25 VIO NS (MISCELLANEOUS) ×3 IMPLANT
MESH PHASIX ST 10X15 (Mesh General) IMPLANT
MESH PHASIX ST 15X20 (Mesh General) ×3 IMPLANT
MESH ULTRAPRO 6X6 15CM15CM (Mesh General) ×3 IMPLANT
NEEDLE HYPO 25GX1X1/2 BEV (NEEDLE) ×3 IMPLANT
NS IRRIG 1000ML POUR BTL (IV SOLUTION) ×3 IMPLANT
PACK GENERAL/GYN (CUSTOM PROCEDURE TRAY) ×3 IMPLANT
PAD ARMBOARD 7.5X6 YLW CONV (MISCELLANEOUS) ×3 IMPLANT
STAPLER VISISTAT 35W (STAPLE) ×3 IMPLANT
SUT ETHILON 2 0 FS 18 (SUTURE) ×6 IMPLANT
SUT MON AB 4-0 PC3 18 (SUTURE) ×3 IMPLANT
SUT NOVA NAB DX-16 0-1 5-0 T12 (SUTURE) ×3 IMPLANT
SUT NOVA NAB GS-21 0 18 T12 DT (SUTURE) ×9 IMPLANT
SUT PDS AB 1 CTX 36 (SUTURE) ×15 IMPLANT
SUT VICRYL 0 UR6 27IN ABS (SUTURE) ×6 IMPLANT
SYR CONTROL 10ML LL (SYRINGE) IMPLANT
SYRINGE 10CC LL (SYRINGE) ×3 IMPLANT
TISSUE MATRIX STRATTICE 6X8 (Tissue) ×3 IMPLANT
TOWEL OR 17X24 6PK STRL BLUE (TOWEL DISPOSABLE) IMPLANT
TOWEL OR 17X26 10 PK STRL BLUE (TOWEL DISPOSABLE) ×3 IMPLANT
TRAY FOLEY CATH 16FRSI W/METER (SET/KITS/TRAYS/PACK) ×3 IMPLANT

## 2016-08-13 NOTE — Transfer of Care (Signed)
Immediate Anesthesia Transfer of Care Note  Patient: Alison StallingMelanie R Gaynor  Procedure(s) Performed: Procedure(s): HERNIA REPAIR INCISIONAL WITH TRANSVERSE ABDOMINUS RELEASE (N/A) INSERTION OF MESH (N/A)  Patient Location: PACU  Anesthesia Type:General  Level of Consciousness: awake, alert  and oriented  Airway & Oxygen Therapy: Patient Spontanous Breathing  Post-op Assessment: Report given to RN, Post -op Vital signs reviewed and stable and Patient moving all extremities X 4  Post vital signs: Reviewed and stable  Last Vitals:  Vitals:   08/13/16 0544  BP: 122/75  Pulse: 92  Resp: 18  Temp: 37 C    Last Pain:  Vitals:   08/13/16 0556  TempSrc:   PainSc: 6       Patients Stated Pain Goal: 8 (08/13/16 0556)  Complications: No apparent anesthesia complications

## 2016-08-13 NOTE — Interval H&P Note (Signed)
History and Physical Interval Note:  08/13/2016 7:18 AM  Alison StallingMelanie R Vanderweide  has presented today for surgery, with the diagnosis of incisional hernia  The various methods of treatment have been discussed with the patient and family. After consideration of risks, benefits and other options for treatment, the patient has consented to  Procedure(s): HERNIA REPAIR INCISIONAL WITH TRANSVERSE ABDOMINUS RELEASE (N/A) INSERTION OF MESH (N/A) as a surgical intervention .  The patient's history has been reviewed, patient examined, no change in status, stable for surgery.  I have reviewed the patient's chart and labs.  Questions were answered to the patient's satisfaction.     Kyllian Clingerman A.

## 2016-08-13 NOTE — Progress Notes (Signed)
Orthopedic Tech Progress Note Patient Details:  Anita Villarreal 04/19/1971 [id number]  Ortho Devices Type of Ortho Device: Abdominal binder Ortho Device/Splint Interventions: Freeman CaldronOrdered   Anita Villarreal 08/13/2016, 11:12 AM Viewed order from RN order list

## 2016-08-13 NOTE — Op Note (Signed)
Preoperative diagnosis: Complex incisional hernia reducible  Postoperative diagnosis: Same  Procedure: Open repair of complex incisional hernia with transversus abdominis release  And mesh  Surgeon: Erroll Luna M.D.  Anesthesia: Gen. with 30 mL of exparel   Drains: 2     19 round drains to submuscular space and subcutaneous space  Specimens: None  EBL:  100 Cc     Indications for procedure: The patient is a 46 year old female with a previous history of open cholecystectomy and perforated duodenal ulcer repair November 2016 presents with multiple admissions to hospital over the last 4 weeks for partial small bowel obstruction with complex incisional hernia. She had been discharged home twice a return to my office after discharge. I recommended admission the following week for repair given her previous history of multiple small bowel obstructions secondary to hernia. I discussed the complex video an open repair as well as transversus abdominis release given her obesity. She also stop smoking but does have COPD and is at risk for recurrence. Her BMI is greater than 35 and she is at increased risk of recurrence. She was having too many issues with small bowel obstruction and pain and weight loss was not going to be possible for her. I recommended open repair with mesh.The risk of hernia repair include bleeding,  Infection,   Recurrence of the hernia,  Mesh use, chronic pain,  Organ injury,  Bowel injury,  Bladder injury,   nerve injury with numbness around the incision,  Death,  and worsening of preexisting  medical problems.  The alternatives to surgery have been discussed as well..  Long term expectations of both operative and non operative treatments have been discussed.   The patient agrees to proceed.      Description of procedure: Patient was met in the holding area and questions are answered. She was taken back to the operating room and placed upon the OR table. After induction of  general anesthesia a Foley catheter was placed under sterile conditions. After the abdomen was prepped and draped in a sterile fashion timeout was done. She received preoperative antibiotics. Midline incision was used the old scar was excised from her upper midline incision. Dissection was carried out of the fashion-year-old multiple Swiss cheese defects of the fascia reducible. Once the fascial edge was identified, adhesions were taken down sharply. Small bowel was tethered to some of the hernia sacs these were taken down sharply without injury. Once the undersurface of the fascia was cleared of adhesions, I examined the intraconal contents insult no evidence of bowel obstruction. Conclusion used to identify the fascial edge. She had had a previous open cholecystectomy. I then mobilized the posterior sheath from the undersurface of the right rectus muscle using cautery. This was taken laterally to mobilize the right transversus abdominous muscle. The tissues were very flimsy and are not of good quality from her previous laparotomies. Same was done on the patient's left side. Once the transversus abdominis muscle and posterior sheath were mobilized I was able to pull these together. The tissues were extremely thin. I was able then to close the posterior sheath with #1 PDS. I did have to use a small patch of Stratus and phasic mesh measuring 15 x 20 cm telecommuting struck the posterior sheath. A 15 x 20 cm piece of ultra pro mesh was then used and placed in the retro- rectus muscle position.  A tacker was used using absorbable tacks circumferentially position the mesh out to the rectus abdominis. Irrigation was then  used. There were no holes or defects in the posterior sheath at this point. Through separate stab incision I placed a round 19 French drain in the retrorectus position. This was secured to skin with 2-0 nylon. I then closed rectus muscles using #1 PDS in a running fashion. I had to mobilize a lot of her  skin and subcutaneous tissues to release it's that we could close the skin. I elected to use another 19 round drain in the subcutaneous tissues to prevent seroma. This was secured to the skin using a 2-0 nylon and came out of the patient's right side. Staples used to close the skin in a honeycomb dressing applied. All final counts are found to be correct. Of note 30 mL of exparel diluted 20 mL by 20 mL with saline were injected into the rectus muscle for block. All final counts are found to be correct. The patient was awoke extubated taken to recovery in satisfactory condition.

## 2016-08-13 NOTE — Progress Notes (Signed)
Anita Villarreal is a 46 y.o. female patient admitted from PACU awake, alert - oriented  X 4 - no acute distress noted.  VSS -     IV in place, occlusive dsg intact without redness. Dilaudid PCA running and settings verified with [name], [name]. Patient taught incentive spirometer and education about importance of use.   Orientation to room, and floor completed with information packet given to patient/family.  Patient declined safety video at this time.  Admission INP armband ID verified with patient/family, and in place.   SR up x 2, fall assessment complete, with patient and family able to verbalize understanding of risk associated with falls, and verbalized understanding to call nsg before up out of bed.  Call light within reach, patient able to voice, and demonstrate understanding.     Honeycomb  Dressing to midline with scan drainage. ABD binder placed. Left JP drain emptied with and Right JP drain with minimal output.     Will cont to eval and treat per MD orders.  [name], [name] C, RN 08/13/2016 1:28 PM

## 2016-08-13 NOTE — H&P (View-Only) (Signed)
Anita Villarreal 08/07/2016 10:31 AM Location: [facility] Patient #: [id number] DOB: 05/03/1971 Single / Language: English / Race: Black or African American Female  History of Present Illness (Anita Rawles A. Mikeila Burgen MD; 08/07/2016 10:44 AM) Patient words: Patient returns for follow-up of incisional hernia. She is a 2 previous admissions for partial small bowel obstructions in the last 4 weeks abdominal pain from her incisional hernia. She has a history of a laparotomy in November 2016 for perforated gastric ulcer. She is pain-free today but is still having issues at times eating. She is having no further vomiting.  The patient is a 45 year old female.   Allergies (Anita Villarreal, CMA; 08/07/2016 10:32 AM) No Known Drug Allergies 07/15/2015  Medication History (Anita Villarreal, CMA; 08/07/2016 10:32 AM) Percocet (5-325MG Tablet, 1 (one) Tablet Oral four times daily, as needed, Taken starting 07/15/2015) Active. Lithium Carbonate (150MG Capsule, Oral) Active. Albuterol (90MCG/ACT Aerosol Soln, Inhalation) Active. Methocarbamol (500MG Tablet, Oral) Active. OxyCODONE HCl (7.5MG Tab Abuse Deter, Oral) Active. Imitrex (25MG Tablet, Oral) Active. Ziprasidone HCl (80MG Capsule, Oral) Active. Famotidine (20MG Tablet, Oral) Active. Ensure Active Light (Oral) Active. Medications Reconciled    Vitals (Anita Villarreal CMA; 08/07/2016 10:32 AM) 08/07/2016 10:32 AM Weight: 253.4 lb Height: 68in Body Surface Area: 2.26 m Body Mass Index: 38.53 kg/m  Temp.: 98.4F  Pulse: 99 (Regular)  BP: 112/72 (Sitting, Left Arm, Standard)      Physical Exam (Anita Pinheiro A. Alexy Bringle MD; 08/07/2016 10:44 AM)  General Mental Status-Alert. General Appearance-Consistent with stated age. Hydration-Well hydrated. Voice-Normal.  Head and Neck Head-normocephalic, atraumatic with no lesions or palpable masses. Trachea-midline. Thyroid Gland Characteristics - normal size and  consistency.  Eye Eyeball - Bilateral-Extraocular movements intact. Sclera/Conjunctiva - Bilateral-No scleral icterus.  Cardiovascular Cardiovascular examination reveals -normal heart sounds, regular rate and rhythm with no murmurs and normal pedal pulses bilaterally.  Abdomen Note: Reducible upper midline incisional hernia. There are multiple small hernias noted. Previous cholecystectomy site noted. Soft nontender nondistended currently.  Neurologic Neurologic evaluation reveals -alert and oriented x 3 with no impairment of recent or remote memory. Mental Status-Normal.  Musculoskeletal Normal Exam - Left-Upper Extremity Strength Normal and Lower Extremity Strength Normal. Normal Exam - Right-Upper Extremity Strength Normal and Lower Extremity Strength Normal.    Assessment & Plan (Anita Macintyre A. Saylor Sheckler MD; 08/07/2016 10:45 AM)  INCISIONAL HERNIA, WITHOUT OBSTRUCTION OR GANGRENE (K43.2) Impression: Discussed risk of bleeding, infection, mesh use, chronic pain, chronic wound, organ injury, death, DVT, the further treatment in her procedures, hernia recurrence rates, the need to stop smoking, and exacerbation of underlying medical problems. Also discussed potential bowel injury need further revisional surgery. Discussed potential need to release the transversus abdominis muscle. She would like to proceed due to exacerbation of her symptoms.  Current Plans You are being scheduled for surgery- Our schedulers will call you.  You should hear from our office's scheduling department within 5 working days about the location, date, and time of surgery. We try to make accommodations for patient's preferences in scheduling surgery, but sometimes the OR schedule or the surgeon's schedule prevents us from making those accommodations.  If you have not heard from our office ([phone]) in 5 working days, call the office and ask for your surgeon's nurse.  If you have other questions  about your diagnosis, plan, or surgery, call the office and ask for your surgeon's nurse.  The anatomy & physiology of the abdominal wall was discussed. The pathophysiology of hernias was discussed. Natural history risks without surgery   including progeressive enlargement, pain, incarceration, & strangulation was discussed. Contributors to complications such as smoking, obesity, diabetes, prior surgery, etc were discussed.  I feel the risks of no intervention will lead to serious problems that outweigh the operative risks; therefore, I recommended surgery to reduce and repair the hernia. I explained laparoscopic techniques with possible need for an open approach. I noted the probable use of mesh to patch and/or buttress the hernia repair  Risks such as bleeding, infection, abscess, need for further treatment, heart attack, death, and other risks were discussed. I noted a good likelihood this will help address the problem. Goals of post-operative recovery were discussed as well. Possibility that this will not correct all symptoms was explained. I stressed the importance of low-impact activity, aggressive pain control, avoiding constipation, & not pushing through pain to minimize risk of post-operative chronic pain or injury. Possibility of reherniation especially with smoking, obesity, diabetes, immunosuppression, and other health conditions was discussed. We will work to minimize complications.  An educational handout further explaining the pathology & treatment options was given as well. Questions were answered. The patient expresses understanding & wishes to proceed with surgery.  Pt Education - Pamphlet Given - Hernia Surgery: discussed with patient and provided information. 

## 2016-08-13 NOTE — Anesthesia Procedure Notes (Signed)
Procedure Name: Intubation Date/Time: 08/13/2016 7:34 AM Performed by: Rise PatienceBELL, Grizel Vesely T Pre-anesthesia Checklist: Patient identified, Emergency Drugs available, Suction available and Patient being monitored Patient Re-evaluated:Patient Re-evaluated prior to inductionOxygen Delivery Method: Circle System Utilized Preoxygenation: Pre-oxygenation with 100% oxygen Intubation Type: IV induction Ventilation: Mask ventilation without difficulty and Oral airway inserted - appropriate to patient size Laryngoscope Size: Hyacinth MeekerMiller and 2 Grade View: Grade I Tube type: Oral Tube size: 7.5 mm Number of attempts: 1 Airway Equipment and Method: Stylet and Oral airway Placement Confirmation: ETT inserted through vocal cords under direct vision,  positive ETCO2 and breath sounds checked- equal and bilateral Secured at: 22 cm Tube secured with: Tape Dental Injury: Teeth and Oropharynx as per pre-operative assessment

## 2016-08-13 NOTE — Anesthesia Preprocedure Evaluation (Addendum)
Anesthesia Evaluation  Patient identified by MRN, date of birth, ID band Patient awake    Reviewed: Allergy & Precautions, NPO status , Patient's Chart, lab work & pertinent test results  Airway Mallampati: II  TM Distance: >3 FB Neck ROM: Full    Dental no notable dental hx. (+) Teeth Intact, Dental Advisory Given   Pulmonary asthma , Current Smoker,    Pulmonary exam normal breath sounds clear to auscultation       Cardiovascular Normal cardiovascular exam Rhythm:Regular Rate:Normal     Neuro/Psych  Headaches, PSYCHIATRIC DISORDERS Bipolar Disorder    GI/Hepatic PUD,   Endo/Other  Morbid obesity  Renal/GU      Musculoskeletal   Abdominal   Peds  Hematology   Anesthesia Other Findings   Reproductive/Obstetrics                          Anesthesia Physical Anesthesia Plan  ASA: II  Anesthesia Plan: General   Post-op Pain Management:    Induction: Intravenous  Airway Management Planned: Oral ETT  Additional Equipment:   Intra-op Plan:   Post-operative Plan: Extubation in OR  Informed Consent: I have reviewed the patients History and Physical, chart, labs and discussed the procedure including the risks, benefits and alternatives for the proposed anesthesia with the patient or authorized representative who has indicated his/her understanding and acceptance.   Dental advisory given  Plan Discussed with: Anesthesiologist, Surgeon and CRNA  Anesthesia Plan Comments:         Anesthesia Quick Evaluation

## 2016-08-14 ENCOUNTER — Encounter (HOSPITAL_COMMUNITY): Payer: Self-pay | Admitting: Surgery

## 2016-08-14 LAB — CBC
HCT: 38.4 % (ref 36.0–46.0)
Hemoglobin: 12.8 g/dL (ref 12.0–15.0)
MCH: 30.3 pg (ref 26.0–34.0)
MCHC: 33.3 g/dL (ref 30.0–36.0)
MCV: 90.8 fL (ref 78.0–100.0)
PLATELETS: 431 10*3/uL — AB (ref 150–400)
RBC: 4.23 MIL/uL (ref 3.87–5.11)
RDW: 15.2 % (ref 11.5–15.5)
WBC: 17 10*3/uL — AB (ref 4.0–10.5)

## 2016-08-14 LAB — COMPREHENSIVE METABOLIC PANEL
ALT: 30 U/L (ref 14–54)
AST: 35 U/L (ref 15–41)
Albumin: 3.3 g/dL — ABNORMAL LOW (ref 3.5–5.0)
Alkaline Phosphatase: 99 U/L (ref 38–126)
Anion gap: 7 (ref 5–15)
BUN: 5 mg/dL — ABNORMAL LOW (ref 6–20)
CHLORIDE: 107 mmol/L (ref 101–111)
CO2: 22 mmol/L (ref 22–32)
Calcium: 9 mg/dL (ref 8.9–10.3)
Creatinine, Ser: 1 mg/dL (ref 0.44–1.00)
Glucose, Bld: 147 mg/dL — ABNORMAL HIGH (ref 65–99)
POTASSIUM: 4.1 mmol/L (ref 3.5–5.1)
SODIUM: 136 mmol/L (ref 135–145)
Total Bilirubin: 0.4 mg/dL (ref 0.3–1.2)
Total Protein: 6.6 g/dL (ref 6.5–8.1)

## 2016-08-14 MED ORDER — ACETAMINOPHEN 650 MG RE SUPP: 650.0000 mg | Freq: Four times a day (QID) | RECTAL | Status: DC

## 2016-08-14 MED ORDER — ACETAMINOPHEN 500 MG PO TABS
1000.0000 mg | ORAL_TABLET | Freq: Four times a day (QID) | ORAL | Status: DC
  Administered 2016-08-14 – 2016-08-16 (×10): 1000 mg via ORAL
  Filled 2016-08-14 (×12): qty 2

## 2016-08-14 NOTE — Anesthesia Postprocedure Evaluation (Signed)
Anesthesia Post Note  Patient: Anita Villarreal  Procedure(s) Performed: Procedure(s) (LRB): HERNIA REPAIR INCISIONAL WITH TRANSVERSE ABDOMINUS RELEASE (N/A) INSERTION OF MESH (N/A)  Anesthesia Type: General       Last Vitals:  Vitals:   08/14/16 0809 08/14/16 0951  BP:  123/77  Pulse:  99  Resp: 20 (!) 40  Temp:  36.6 C    Last Pain:  Vitals:   08/14/16 1033  TempSrc:   PainSc: 2                  Phillips Groutarignan, Clydene Burack

## 2016-08-14 NOTE — Progress Notes (Signed)
1 Day Post-Op  Subjective: Pain tolerable with PCA, she has only been OOB x 1 so far.  Foley out this AM.  No flatus.  Objective: Vital signs in last 24 hours: Temp:  [98 F (36.7 C)-99.2 F (37.3 C)] 98.4 F (36.9 C) (02/09 [id number]) Pulse Rate:  [71-109] 109 (02/09 0638) Resp:  [17-30] 20 (02/09 0809) BP: (114-124)/(67-84) 114/78 (02/09 0638) SpO2:  [95 %-100 %] 100 % (02/09 0809) Weight:  [116.8 kg (257 lb 8 oz)] 116.8 kg (257 lb 8 oz) (02/08 1316) Last BM Date: 08/11/16 NPO 3000 IV Urine 950 Drain 205 TM 99.2 WBC up to 17K   Intake/Output from previous day: 02/08 0701 - 02/09 0700 In: 3030 [I.V.:3030] Out: 1205 [Urine:950; Drains:205; Blood:50] Intake/Output this shift: No intake/output data recorded.  General appearance: alert, cooperative and no distress Resp: clear to auscultation bilaterally GI: tender, still distended, no BS.  drains still bloody, more so on right that left.  Left is more serosanguinous in nature.    Lab Results:   Recent Labs  08/12/16 1312 08/14/16 0654  WBC 14.1* 17.0*  HGB 13.9 12.8  HCT 41.2 38.4  PLT 438* 431*    BMET  Recent Labs  08/12/16 1312 08/14/16 0654  NA 136 136  K 3.8 4.1  CL 105 107  CO2 23 22  GLUCOSE 106* 147*  BUN 5* 5*  CREATININE 1.07* 1.00  CALCIUM 9.8 9.0   PT/INR No results for input(s): LABPROT, INR in the last 72 hours.   Recent Labs Lab 08/14/16 0654  AST 35  ALT 30  ALKPHOS 99  BILITOT 0.4  PROT 6.6  ALBUMIN 3.3*     Lipase     Component Value Date/Time   LIPASE 23 07/31/2016 1420     Studies/Results: No results found.  Medications: . dicyclomine  20 mg Oral QID  . enoxaparin (LOVENOX) injection  40 mg Subcutaneous Q24H  . HYDROmorphone   Intravenous Q4H  . hydrOXYzine  50 mg Oral QHS  . lithium carbonate  600 mg Oral BID  . mometasone-formoterol  2 puff Inhalation BID  . nicotine  21 mg Transdermal Daily  . pantoprazole (PROTONIX) IV  40 mg Intravenous QHS  . ziprasidone   320 mg Oral QHS  . zolpidem  5 mg Oral QHS   . dextrose 5 % and 0.9 % NaCl with KCl 20 mEq/L 100 mL/hr at 08/13/16 2353    Assessment/Plan Complex incisional hernia reducible, with recurrent SBO S/p Open repair of complex incisional hernia with transversus abdominis release and mesh, drain placement, 08/13/16, Dr. Harriette Bouillonhomas Cornett S/p perforated gastric hernia 06/04/2015 - Dr. Luisa Hartornett Bipolar disorder/manic depressive disorder Hx of renal insuffiencey Asthma FEN:  IV fluids/ice chips and sips ID:Pre op only DVT:  Lovneox   Plan:  Add  tylenol and mobilize.  Recheck labs AM         LOS: 1 day    Artrice Kraker 08/14/2016 [phone]

## 2016-08-15 LAB — CBC
HEMATOCRIT: 35.4 % — AB (ref 36.0–46.0)
HEMOGLOBIN: 11.5 g/dL — AB (ref 12.0–15.0)
MCH: 29.6 pg (ref 26.0–34.0)
MCHC: 32.5 g/dL (ref 30.0–36.0)
MCV: 91.2 fL (ref 78.0–100.0)
Platelets: 378 10*3/uL (ref 150–400)
RBC: 3.88 MIL/uL (ref 3.87–5.11)
RDW: 15.4 % (ref 11.5–15.5)
WBC: 15.5 10*3/uL — ABNORMAL HIGH (ref 4.0–10.5)

## 2016-08-15 MED ORDER — OXYCODONE-ACETAMINOPHEN 5-325 MG PO TABS
1.0000 | ORAL_TABLET | ORAL | Status: DC | PRN
  Administered 2016-08-15 – 2016-08-17 (×12): 1 via ORAL
  Filled 2016-08-15 (×12): qty 1

## 2016-08-15 MED ORDER — HYDROMORPHONE HCL 2 MG/ML IJ SOLN: 1.0000 mg | INTRAMUSCULAR | Status: DC | PRN

## 2016-08-15 NOTE — Progress Notes (Signed)
2 Days Post-Op  Subjective: Walking halls in good spirits  No BM   Objective: Vital signs in last 24 hours: Temp:  [97.8 F (36.6 C)-98.3 F (36.8 C)] 98.2 F (36.8 C) (02/10 0649) Pulse Rate:  [88-102] 88 (02/10 0649) Resp:  [16-40] 16 (02/10 0814) BP: (106-128)/(67-77) 106/67 (02/10 0649) SpO2:  [96 %-99 %] 98 % (02/10 0814) Last BM Date: 08/11/16  Intake/Output from previous day: 02/09 0701 - 02/10 0700 In: 2196.7 [P.O.:100; I.V.:2096.7] Out: 385 [Urine:200; Drains:185] Intake/Output this shift: No intake/output data recorded.  Incision/Wound:CDI   Soft obese   JP serous   Lab Results:   Recent Labs  08/14/16 0654 08/15/16 0427  WBC 17.0* 15.5*  HGB 12.8 11.5*  HCT 38.4 35.4*  PLT 431* 378   BMET  Recent Labs  08/12/16 1312 08/14/16 0654  NA 136 136  K 3.8 4.1  CL 105 107  CO2 23 22  GLUCOSE 106* 147*  BUN 5* 5*  CREATININE 1.07* 1.00  CALCIUM 9.8 9.0   PT/INR No results for input(s): LABPROT, INR in the last 72 hours. ABG No results for input(s): PHART, HCO3 in the last 72 hours.  Invalid input(s): PCO2, PO2  Studies/Results: No results found.  Anti-infectives: Anti-infectives    Start     Dose/Rate Route Frequency Ordered Stop   08/13/16 1430  ceFAZolin (ANCEF) IVPB 2g/100 mL premix     2 g 200 mL/hr over 30 Minutes Intravenous Every 8 hours 08/13/16 1326 08/13/16 1457   08/13/16 0700  ceFAZolin (ANCEF) 3 g in dextrose 5 % 50 mL IVPB     3 g 130 mL/hr over 30 Minutes [facility] 08/12/16 1259 08/13/16 0750      Assessment/Plan: s/p Procedure(s): HERNIA REPAIR INCISIONAL WITH TRANSVERSE ABDOMINUS RELEASE (N/A) INSERTION OF MESH (N/A) Advance diet  D/C PCA   AMBULATE   LOS: 2 days    Anita Villarreal A. 08/15/2016

## 2016-08-16 MED ORDER — POLYETHYLENE GLYCOL 3350 17 G PO PACK
17.0000 g | PACK | Freq: Two times a day (BID) | ORAL | Status: DC
  Administered 2016-08-16 – 2016-08-17 (×3): 17 g via ORAL
  Filled 2016-08-16 (×3): qty 1

## 2016-08-16 MED ORDER — PANTOPRAZOLE SODIUM 40 MG PO TBEC
40.0000 mg | DELAYED_RELEASE_TABLET | Freq: Every day | ORAL | Status: DC
  Administered 2016-08-16: 40 mg via ORAL
  Filled 2016-08-16: qty 1

## 2016-08-16 NOTE — Progress Notes (Signed)
3 Days Post-Op  Subjective: Pt tolerating clears   No BM but having flatus   Objective: Vital signs in last 24 hours: Temp:  [98.1 F (36.7 C)-98.6 F (37 C)] 98.6 F (37 C) (02/11 0427) Pulse Rate:  [85-91] 90 (02/11 0427) Resp:  [16-19] 18 (02/11 0427) BP: (91-129)/(55-93) 111/66 (02/11 0427) SpO2:  [98 %-100 %] 100 % (02/11 0427) Last BM Date: 08/11/16  Intake/Output from previous day: 02/10 0701 - 02/11 0700 In: 2326.3 [P.O.:1440; I.V.:886.3] Out: 2035 [Urine:1950; Drains:85] Intake/Output this shift: No intake/output data recorded.  Incision/Wound:dressing intact and clean  JP serous obese sore abdomen   Lab Results:   Recent Labs  08/14/16 0654 08/15/16 0427  WBC 17.0* 15.5*  HGB 12.8 11.5*  HCT 38.4 35.4*  PLT 431* 378   BMET  Recent Labs  08/14/16 0654  NA 136  K 4.1  CL 107  CO2 22  GLUCOSE 147*  BUN 5*  CREATININE 1.00  CALCIUM 9.0   PT/INR No results for input(s): LABPROT, INR in the last 72 hours. ABG No results for input(s): PHART, HCO3 in the last 72 hours.  Invalid input(s): PCO2, PO2  Studies/Results: No results found.  Anti-infectives: Anti-infectives    Start     Dose/Rate Route Frequency Ordered Stop   08/13/16 1430  ceFAZolin (ANCEF) IVPB 2g/100 mL premix     2 g 200 mL/hr over 30 Minutes Intravenous Every 8 hours 08/13/16 1326 08/13/16 1457   08/13/16 0700  ceFAZolin (ANCEF) 3 g in dextrose 5 % 50 mL IVPB     3 g 130 mL/hr over 30 Minutes [facility] 08/12/16 1259 08/13/16 0750      Assessment/Plan: s/p Procedure(s): HERNIA REPAIR INCISIONAL WITH TRANSVERSE ABDOMINUS RELEASE (N/A) INSERTION OF MESH (N/A) Adv diet  OOB   LOS: 3 days    Ryott Rafferty A. 08/16/2016

## 2016-08-17 MED ORDER — OXYCODONE-ACETAMINOPHEN 5-325 MG PO TABS: 1.0000 | ORAL_TABLET | ORAL | 0 refills | Status: DC | PRN

## 2016-08-17 NOTE — Discharge Instructions (Signed)
[facility]      [facility], [place] [phone]  OPEN ABDOMINAL SURGERY: POST OP INSTRUCTIONS  Always review your discharge instruction sheet given to you by the facility where your surgery was performed.  IF YOU HAVE DISABILITY OR FAMILY LEAVE FORMS, YOU MUST BRING THEM TO THE OFFICE FOR PROCESSING.  PLEASE DO NOT GIVE THEM TO YOUR DOCTOR.  1. A prescription for pain medication may be given to you upon discharge.  Take your pain medication as prescribed, if needed.  If narcotic pain medicine is not needed, then you may take acetaminophen (Tylenol) or ibuprofen (Advil) as needed. 2. Take your usually prescribed medications unless otherwise directed. 3. If you need a refill on your pain medication, please contact your pharmacy. They will contact our office to request authorization.  Prescriptions will not be filled after 5pm or on week-ends. 4. You should follow a light diet the first few days after arrival home, such as soup and crackers, pudding, etc.unless your doctor has advised otherwise. A high-fiber, low fat diet can be resumed as tolerated.   Be sure to include lots of fluids daily. Most patients will experience some swelling and bruising on the chest and neck area.  Ice packs will help.  Swelling and bruising can take several days to resolve 5. Most patients will experience some swelling and bruising in the area of the incision. Ice pack will help. Swelling and bruising can take several days to resolve..  6. It is common to experience some constipation if taking pain medication after surgery.  Increasing fluid intake and taking a stool softener will usually help or prevent this problem from occurring.  A mild laxative (Milk of Magnesia or Miralax) and a stool softener should be taken according to package directions if there are no bowel movements after 48 hours. 7.  Any sutures or staples will be removed at the office during your follow-up visit. You may find that a light gauze bandage over  your incision may keep your staples from being rubbed or pulled. You may shower and replace the bandage daily. 8. ACTIVITIES:  You may resume regular (light) daily activities beginning the next day--such as daily self-care, walking, climbing stairs--gradually increasing activities as tolerated.  You may have sexual intercourse when it is comfortable.  Refrain from any heavy lifting or straining until approved by your doctor. a. You may drive when you no longer are taking prescription pain medication, you can comfortably wear a seatbelt, and you can safely maneuver your car and apply brakes 9. You should see your doctor in the office for a follow-up appointment approximately two weeks after your surgery.  Make sure that you call for this appointment within a day or two after you arrive home to insure a convenient appointment time. OTHER INSTRUCTIONS:  _Call to set up an appointment to have your staples removed on 2/20/18_  WHEN TO CALL YOUR DOCTOR: 1. Fever over 101.0 2. Inability to urinate 3. Nausea and/or vomiting 4. Extreme swelling or bruising 5. Continued bleeding from incision. 6. Increased pain, redness, or drainage from the incision. 7. Difficulty swallowing or breathing 8. Muscle cramping or spasms. 9. Numbness or tingling in hands or feet or around lips.  The clinic staff is available to answer your questions during regular business hours.  Please dont hesitate to call and ask to speak to one of the nurses if you have concerns.  For further questions, please visit [email or web address]   [facility] Introduction Surgical drains are  used to remove extra fluid that normally builds up in a surgical wound after surgery. A surgical drain helps to heal a surgical wound. Different kinds of surgical drains include:  Active drains. These drains use suction to pull drainage away from the surgical wound. Drainage flows through a tube to a container outside of the  body. It is important to keep the bulb or the drainage container flat (compressed) at all times, except while you empty it. Flattening the bulb or container creates suction. The two most common types of active drains are bulb drains and Hemovac drains.  Passive drains. These drains allow fluid to drain naturally, by gravity. Drainage flows through a tube to a bandage (dressing) or a container outside of the body. Passive drains do not need to be emptied. The most common type of passive drain is the Penrose drain. A drain is placed during surgery. Immediately after surgery, drainage is usually bright red and a little thicker than water. The drainage may gradually turn yellow or pink and become thinner. It is likely that your health care provider will remove the drain when the drainage stops or when the amount decreases to 1-2 Tbsp (15-30 mL) during a 24-hour period. How to care for your surgical drain  Keep the skin around the drain dry and covered with a dressing at all times.  Check your drain area every day for signs of infection. Check for:  More redness, swelling, or pain.  Pus or a bad smell.  Cloudy drainage. Follow instructions from your health care provider about how to take care of your drain and how to change your dressing. Change your dressing at least one time every day. Change it more often if needed to keep the dressing dry. Make sure you: 1. Gather your supplies, including:  Tape.  Germ-free cleaning solution (sterile saline).  Split gauze drain sponge: 4 x 4 inches (10 x 10 cm).  Gauze square: 4 x 4 inches (10 x 10 cm). 2. Wash your hands with soap and water before you change your dressing. If soap and water are not available, use hand sanitizer. 3. Remove the old dressing. Avoid using scissors to do that. 4. Use sterile saline to clean your skin around the drain. 5. Place the tube through the slit in a drain sponge. Place the drain sponge so that it covers your  wound. 6. Place the gauze square or another drain sponge on top of the drain sponge that is on the wound. Make sure the tube is between those layers. 7. Tape the dressing to your skin. 8. If you have an active bulb or Hemovac drain, tape the drainage tube to your skin 1-2 inches (2.5-5 cm) below the place where the tube enters your body. Taping keeps the tube from pulling on any stitches (sutures) that you have. 9. Wash your hands with soap and water. 10. Write down the color of your drainage and how often you change your dressing. How to empty your active bulb or Hemovac drain 1. Make sure that you have a measuring cup that you can empty your drainage into. 2. Wash your hands with soap and water. If soap and water are not available, use hand sanitizer. 3. Gently move your fingers down the tube while squeezing very lightly. This is called stripping the tube. This clears any drainage, clots, or tissue from the tube.  Do not pull on the tube.  You may need to strip the tube several times every day to keep  the tube clear. 4. Open the bulb cap or the drain plug. Do not touch the inside of the cap or the bottom of the plug. 5. Empty all of the drainage into the measuring cup. 6. Compress the bulb or the container and replace the cap or the plug. To compress the bulb or the container, squeeze it firmly in the middle while you close the cap or plug the container. 7. Write down the amount of drainage that you have in each 24-hour period. If you have less than 2 Tbsp (30 mL) of drainage during 24 hours, contact your health care provider. 8. Flush the drainage down the toilet. 9. Wash your hands with soap and water. Contact a health care provider if:  You have more redness, swelling, or pain around your drain area.  The amount of drainage that you have is increasing instead of decreasing.  You have pus or a bad smell coming from your drain area.  You have a fever.  You have drainage that is  cloudy.  There is a sudden stop or a sudden decrease in the amount of drainage that you have.  Your tube falls out.  Your active draindoes not stay compressedafter you empty it. This information is not intended to replace advice given to you by your health care provider. Make sure you discuss any questions you have with your health care provider. Document Released: 06/19/2000 Document Revised: 11/28/2015 Document Reviewed: 01/09/2015  2017 [employer]

## 2016-08-17 NOTE — Progress Notes (Signed)
D/C papers gone over with pt. IV taken out. Prescription given to pt. NO questions/complaints. Tegaderm changed over JP drain site. JP drain teaching done. REturn demonstration done by pt. Pt. States she understands how to empty JP drains and record output. JP drain info. Sheet given to pt.

## 2016-08-17 NOTE — Discharge Summary (Signed)
[facility] Discharge Summary   Patient ID: Anita StallingMelanie R Bodi MRN: [id number] DOB/AGE: 1970-09-29 46 y.o.  Admit date: 08/13/2016 Discharge date: 08/17/2016  Admitting Diagnosis: INCISIONAL HERNIA, WITHOUT OBSTRUCTION OR GANGRENE   Discharge Diagnosis Patient Active Problem List   Diagnosis Date Noted  . Nausea & vomiting 07/31/2016  . Incisional hernia 07/25/2016  . Postoperative ileus (HCC) 06/27/2015  . Gastric leak 06/27/2015  . Anemia of chronic disease 06/27/2015  . Hypokalemia 06/17/2015  . HCAP (healthcare-associated pneumonia) 06/17/2015  . Pneumonia 06/17/2015  . Intra-abdominal infection   . Abdominal abscess (HCC)   . Bipolar I disorder, most recent episode mixed (HCC) 06/07/2015  . Acute gastric perforation (HCC) 06/04/2015    Consultants None  Imaging: No results found.  Procedures Dr. Luisa Hartornett (08/13/2016) - Open repair of complex incisional hernia with transversus abdominis release and mesh and drain placement x 2  Hospital Course:  Anita BellMelanie Villarreal is a 46 year old female who [facility] hernia [facility]. Pt was admitted and underwent procedure listed above.  Tolerated procedure well and was transferred to the floor.  Diet was advanced as tolerated.  On POD#4, the patient was voiding well, having flatus, tolerating diet, ambulating well, pain well controlled, vital signs stable, incisions c/d/i and felt stable for discharge home.  Patient will follow up in our office in 1 week for drain removal and in 2 weeks with Dr. Luisa Hartornett. She knows to call with questions or concerns.  She will call to confirm appointment date/time.    Patient was discharged in good condition.  The [place] Substance controlled database was reviewed prior to prescribing narcotic pain medication.   Allergies as of 08/17/2016      Reactions   Erythromycin Swelling   Tongue swelling    Nsaids Other (See Comments)   BLEEDING  (internally)   Sulfa Antibiotics Swelling   Swelling of the tongue      Medication List    STOP taking these medications   acetaminophen 500 MG tablet Commonly known as:  TYLENOL     TAKE these medications   albuterol 108 (90 Base) MCG/ACT inhaler Commonly known as:  PROVENTIL HFA;VENTOLIN HFA Inhale 2 puffs into the lungs every 6 (six) hours as needed for wheezing or shortness of breath.   albuterol (2.5 MG/3ML) 0.083% nebulizer solution Commonly known as:  PROVENTIL Take 2.5 mg by nebulization every 6 (six) hours as needed for wheezing or shortness of breath.   budesonide-formoterol 80-4.5 MCG/ACT inhaler Commonly known as:  SYMBICORT Inhale 2 puffs into the lungs daily.   dicyclomine 20 MG tablet Commonly known as:  BENTYL Take 20 mg by mouth 4 (four) times daily.   hydrOXYzine 100 MG capsule Commonly known as:  VISTARIL Take 200 mg by mouth at bedtime.   lithium carbonate 300 MG capsule Take 600 mg by mouth 2 (two) times daily.   nicotine 21 mg/24hr patch Commonly known as:  NICODERM CQ - dosed in mg/24 hours Place 21 mg onto the skin daily.   oxyCODONE-acetaminophen 5-325 MG tablet Commonly known as:  PERCOCET/ROXICET Take 1 tablet by mouth every 4 (four) hours as needed for moderate pain.   polyethylene glycol packet Commonly known as:  MIRALAX / GLYCOLAX Take 17 g by mouth daily as needed. What changed:  additional instructions   promethazine 25 MG tablet Commonly known as:  PHENERGAN Take 25-50 mg by mouth See admin instructions. Along with each dose of Imitrex as needed for nausea  accompanying migraines   SUMAtriptan 100 MG tablet Commonly known as:  IMITREX Take 100 mg by mouth See admin instructions. Once as needed for migraine (may repeat once in 2 hours if no relief)   ziprasidone 80 MG capsule Commonly known as:  GEODON Take 320 mg by mouth at bedtime.   zolpidem 10 MG tablet Commonly known as:  AMBIEN Take 10 mg by mouth at bedtime.         Follow-up Information    CORNETT,THOMAS A., MD. Schedule an appointment as soon as possible for a visit in 2 week(s).   Specialty:  General Surgery Contact information: [address] [place] [address] [phone]        [facility], [place]. Schedule an appointment as soon as possible for a visit on 08/25/2016.   Specialty:  General Surgery Why:  to have your staples removed and possiblely have your drains removed Contact information: [address] [place] [address] [phone]          Signed: [facility] 08/17/2016, 3:36 PM Pager: [phone] Consults: [phone] Mon-Fri 7:00 am-4:30 pm Sat-Sun 7:00 am-11:30 am

## 2016-08-17 NOTE — Progress Notes (Signed)
Pt. Ambulated x 2 in hallway

## 2016-08-17 NOTE — Progress Notes (Signed)
[facility] Progress Note  4 Days Post-Op  Subjective: Tolerating diet. Having flatus, no BM. No acute overnight events.   Objective: Vital signs in last 24 hours: Temp:  [98.4 F (36.9 C)-98.6 F (37 C)] 98.6 F (37 C) (02/12 0436) Pulse Rate:  [81-88] 81 (02/12 0436) Resp:  [18-19] 18 (02/12 0436) BP: (100-110)/(61-71) 100/63 (02/12 0436) SpO2:  [100 %] 100 % (02/12 0436) Last BM Date: 08/11/16  Intake/Output from previous day: 02/11 0701 - 02/12 0700 In: 4337.5 [P.O.:1522; I.V.:2807.5] Out: 2730 [Urine:2700; Drains:30] Intake/Output this shift: No intake/output data recorded.  PE: Gen:  Alert, NAD, pleasant, cooperative, well appearing Card:  RRR, no M/G/R heard Pulm:  CTA, no W/R/R, effort normal Abd: Soft, mildly distended, +BS, incisions C/D/I, drains with minimal serosanguinous drainage Skin: no rashes noted, warm and dry  Lab Results:   Recent Labs  08/15/16 0427  WBC 15.5*  HGB 11.5*  HCT 35.4*  PLT 378   BMET No results for input(s): NA, K, CL, CO2, GLUCOSE, BUN, CREATININE, CALCIUM in the last 72 hours. PT/INR No results for input(s): LABPROT, INR in the last 72 hours. CMP     Component Value Date/Time   NA 136 08/14/2016 0654   K 4.1 08/14/2016 0654   CL 107 08/14/2016 0654   CO2 22 08/14/2016 0654   GLUCOSE 147 (H) 08/14/2016 0654   BUN 5 (L) 08/14/2016 0654   CREATININE 1.00 08/14/2016 0654   CALCIUM 9.0 08/14/2016 0654   PROT 6.6 08/14/2016 0654   ALBUMIN 3.3 (L) 08/14/2016 0654   AST 35 08/14/2016 0654   ALT 30 08/14/2016 0654   ALKPHOS 99 08/14/2016 0654   BILITOT 0.4 08/14/2016 0654   GFRNONAA >60 08/14/2016 0654   GFRAA >60 08/14/2016 0654   Lipase     Component Value Date/Time   LIPASE 23 07/31/2016 1420       Studies/Results: No results found.  Anti-infectives: Anti-infectives    Start     Dose/Rate Route Frequency Ordered Stop   08/13/16 1430  ceFAZolin (ANCEF) IVPB 2g/100 mL premix     2 g 200  mL/hr over 30 Minutes Intravenous Every 8 hours 08/13/16 1326 08/13/16 1457   08/13/16 0700  ceFAZolin (ANCEF) 3 g in dextrose 5 % 50 mL IVPB     3 g 130 mL/hr over 30 Minutes [facility]venous To [facility] 08/12/16 1259 08/13/16 0750       Assessment/Plan  S/p perforated gastric hernia 06/04/2015 - Dr. Luisa Hartornett Bipolar disorder/manic depressive disorder Hx of renal insuffiencey Asthma  Complex incisional hernia reducible, with recurrent SBO S/p Open repair of complex incisional hernia with transversus abdominis release and mesh, drain placement, 08/13/16, Dr. Maisie Fushomas Cornett  FEN: advanced to soft diet today ID: Pre op only DVT:  Lovneox  Plan:  mobilize.  Recheck labs AM, WBC trending down, soft diet today. If she tolerates soft diet she can likely be discharged tomorrow. F/U with Cornett   LOS: 4 days    Jerre SimonJessica L Woodrow Dulski , [facility] 08/17/2016, 7:53 AM Pager: [phone] Consults: [phone] Mon-Fri 7:00 am-4:30 pm Sat-Sun 7:00 am-11:30 am

## 2016-09-08 IMAGING — CT CT ABD-PELV W/ CM
2 of 5 series · 7 of 46 positions shown, 8 images · IV contrast (Iodine)
Comparison: 06/12/2015

CLINICAL DATA: Diffuse abdominal pain. Prior abdominal surgery
06/04/2015.

EXAM:
CT ABDOMEN AND PELVIS WITH CONTRAST
TECHNIQUE: Multidetector CT imaging of the abdomen and pelvis was performed
using the standard protocol following bolus administration of
intravenous contrast.
CONTRAST:  100mL OMNIPAQUE IOHEXOL 300 MG/ML  SOLN

[Series 201: routine, idose (2) · axial · 0.94mm/px · z∈[+173,+523]mm · 4 of 100 slices shown, 5 images]
[im 18/100  soft-tissue]
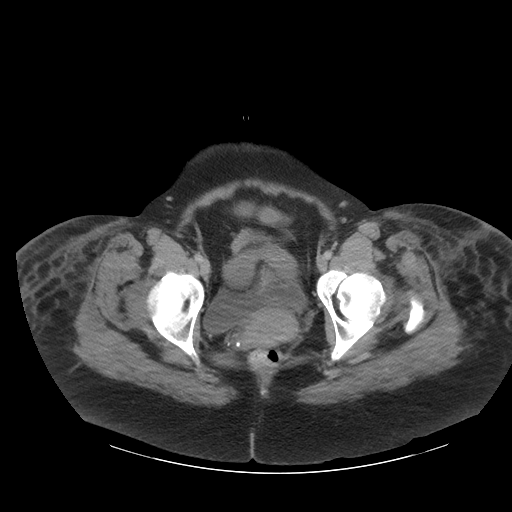
[im 18/100  bone]
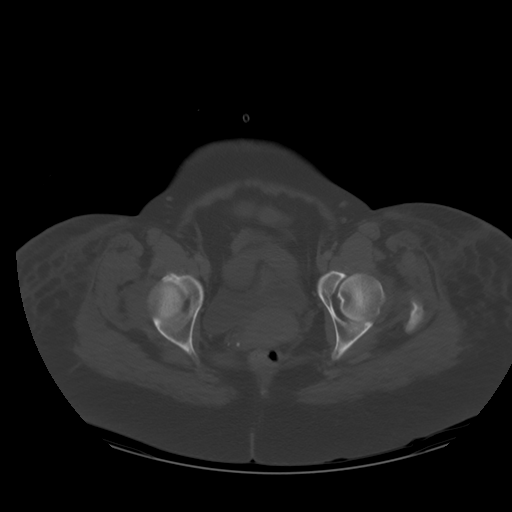
[im 41/100  soft-tissue]
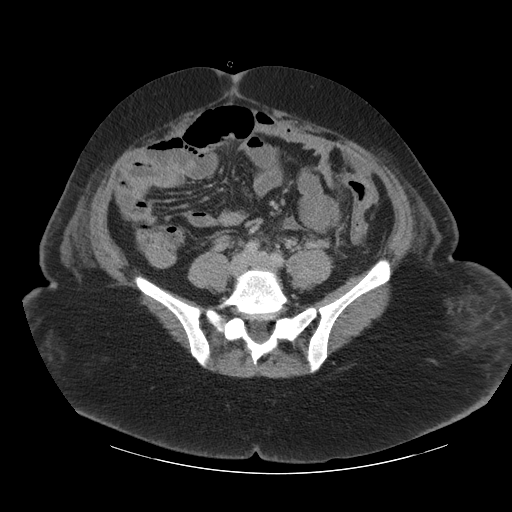
[im 65/100  soft-tissue]
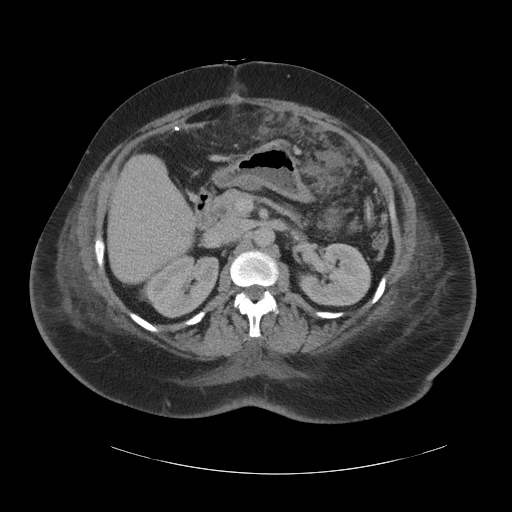
[im 88/100  soft-tissue]
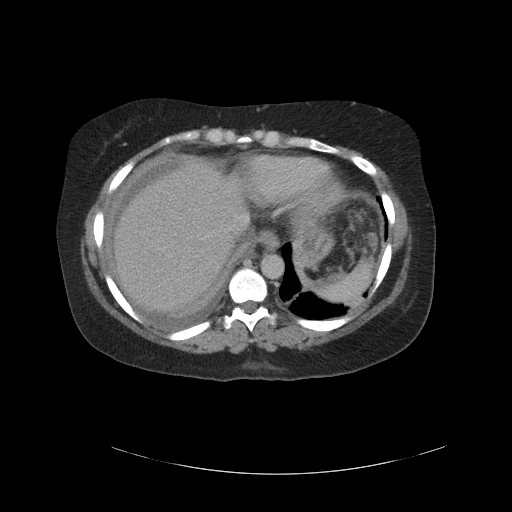

[Series 203: coronals, idose (2) · coronal · 0.45mm/px · 3 of 158 slices shown]
[im 53/158  soft-tissue]
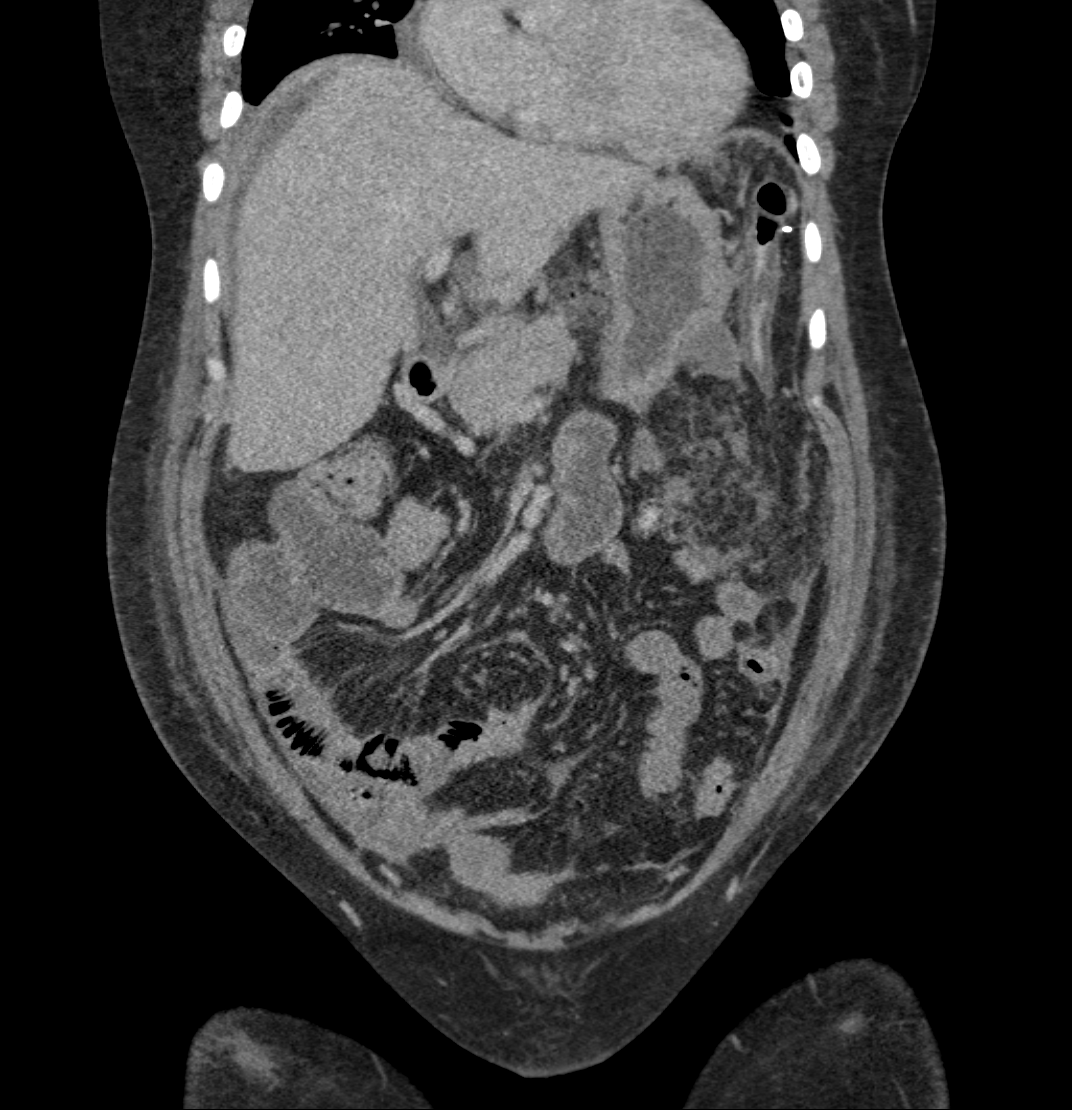
[im 70/158  soft-tissue]
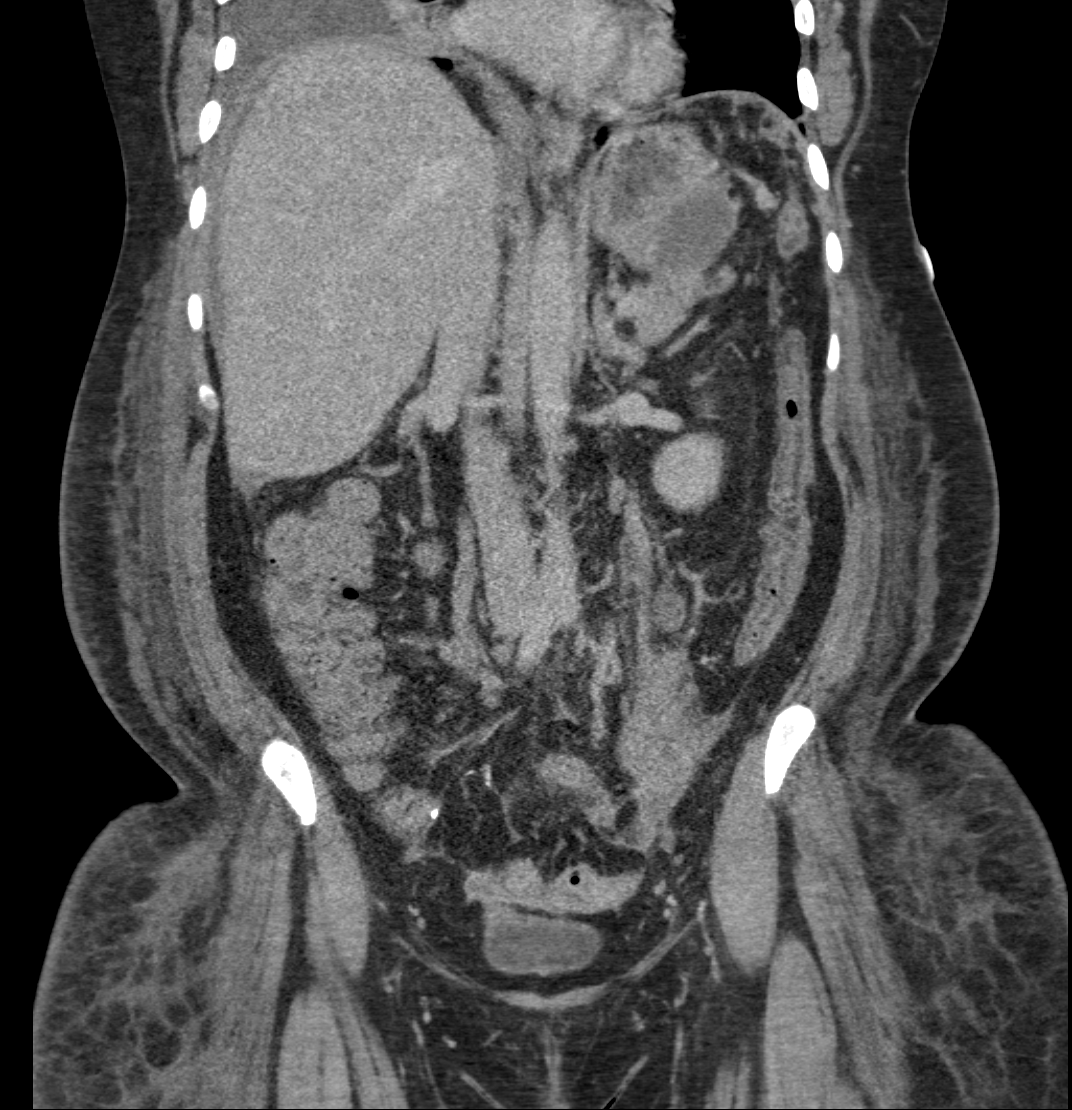
[im 88/158  soft-tissue]
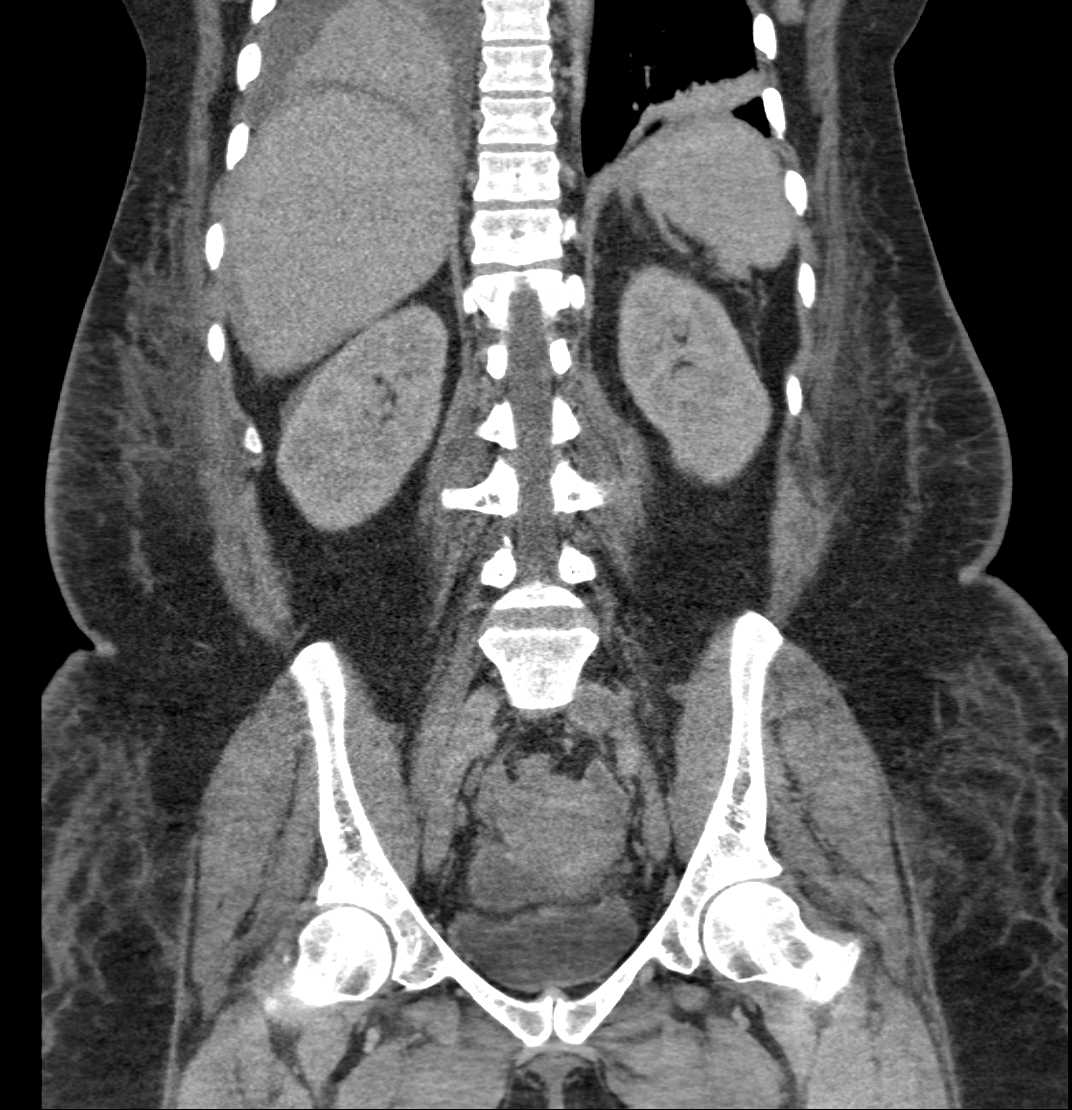

[7 of 46 positions shown; findings below may reference images not displayed]

FINDINGS: Lower chest: Small right pleural effusion. Right lower lobe airspace
disease likely reflecting compressive atelectasis. Normal heart
size.

Hepatobiliary: Normal liver. Prior cholecystectomy. No intrahepatic
or extrahepatic biliary ductal dilatation.

Pancreas: Normal.

Spleen: Normal.

Adrenals/Urinary Tract: Normal adrenal glands. Normal kidneys. No
urolithiasis or obstructive uropathy. Partially decompressed, normal
bladder.

Stomach/Bowel: There is a defect in the wall of the lesser curvature
of the stomach with bubbles of air traversing outside of the stomach
most consistent with a perforated gastric ulcer. Multiple
fluid-filled loops of small bowel with mild small bowel dilatation.
No colonic dilatation. Prior appendectomy. No pneumatosis,
pneumoperitoneum or portal venous gas. Interval removal of surgical
drains. Small amount of perihepatic free fluid. No pneumoperitoneum,
pneumatosis or portal venous gas. Postsurgical changes in the
anterior abdominal wall from midline laparotomy.

Vascular/Lymphatic: Normal caliber abdominal aorta. No abdominal or
pelvic lymphadenopathy.

Reproductive: Normal uterus. Bilateral tubal ligation. Cystic
structures in the adnexa bilaterally which may reflect hydrosalpinx.

Other: Intraperitoneal 3.2 x 9.5 x 7.3 cm fluid collection along
along the left anterior abdominal wall most concerning for abscess
or postoperative seroma. There is severe nodularity and small areas
of fluid collection within the left side of the omentum. There is a
3.9 x 7.5 cm fluid collection along the fundus of the stomach in
which may reflect a postoperative seroma versus an abscess. There is
a second fluid collection more inferiorly measuring 4.7 x 5.3 cm.
Mild body wall edema.

Musculoskeletal: No aggressive lytic or sclerotic osseous lesion. No
acute osseous abnormality.
IMPRESSION: 1. Defect in the wall of the lesser curvature of the stomach with
bubbles of air traversing outside of the stomach most consistent
with a perforated gastric ulcer.
2. Intraperitoneal 3.2 x 9.5 x 7.3 cm fluid collection along along
the left anterior abdominal wall most concerning for abscess or
postoperative seroma. Severe nodularity and small areas of fluid
collection within the left omentum concerning for multiple small
abscesses.
3. Small right pleural effusion with airspace disease likely
reflecting compressive atelectasis.
Critical Value/emergent results were called by telephone at the time
of interpretation on 06/17/2015 at [DATE] to Dr. SLENKY
ASTRIID , who verbally acknowledged these results.

## 2016-10-04 DIAGNOSIS — Z8719 Personal history of other diseases of the digestive system: Secondary | ICD-10-CM | POA: Diagnosis not present

## 2016-10-04 DIAGNOSIS — K219 Gastro-esophageal reflux disease without esophagitis: Secondary | ICD-10-CM | POA: Insufficient documentation

## 2016-10-04 DIAGNOSIS — F3160 Bipolar disorder, current episode mixed, unspecified: Secondary | ICD-10-CM | POA: Diagnosis not present

## 2016-10-04 DIAGNOSIS — R0789 Other chest pain: Secondary | ICD-10-CM | POA: Diagnosis present

## 2016-10-04 DIAGNOSIS — Z9049 Acquired absence of other specified parts of digestive tract: Secondary | ICD-10-CM | POA: Insufficient documentation

## 2016-10-04 DIAGNOSIS — K439 Ventral hernia without obstruction or gangrene: Secondary | ICD-10-CM | POA: Insufficient documentation

## 2016-10-04 DIAGNOSIS — F1410 Cocaine abuse, uncomplicated: Secondary | ICD-10-CM | POA: Insufficient documentation

## 2016-10-04 DIAGNOSIS — Z8701 Personal history of pneumonia (recurrent): Secondary | ICD-10-CM | POA: Insufficient documentation

## 2016-10-04 DIAGNOSIS — J449 Chronic obstructive pulmonary disease, unspecified: Secondary | ICD-10-CM | POA: Diagnosis not present

## 2016-10-04 DIAGNOSIS — Z888 Allergy status to other drugs, medicaments and biological substances status: Secondary | ICD-10-CM | POA: Insufficient documentation

## 2016-10-04 DIAGNOSIS — D638 Anemia in other chronic diseases classified elsewhere: Secondary | ICD-10-CM | POA: Insufficient documentation

## 2016-10-04 DIAGNOSIS — R079 Chest pain, unspecified: Secondary | ICD-10-CM | POA: Diagnosis not present

## 2016-10-04 DIAGNOSIS — I959 Hypotension, unspecified: Secondary | ICD-10-CM | POA: Insufficient documentation

## 2016-10-04 DIAGNOSIS — Z833 Family history of diabetes mellitus: Secondary | ICD-10-CM | POA: Insufficient documentation

## 2016-10-04 DIAGNOSIS — E876 Hypokalemia: Secondary | ICD-10-CM | POA: Diagnosis not present

## 2016-10-04 DIAGNOSIS — K589 Irritable bowel syndrome without diarrhea: Secondary | ICD-10-CM | POA: Diagnosis not present

## 2016-10-04 DIAGNOSIS — F17210 Nicotine dependence, cigarettes, uncomplicated: Secondary | ICD-10-CM | POA: Diagnosis not present

## 2016-10-04 DIAGNOSIS — K429 Umbilical hernia without obstruction or gangrene: Secondary | ICD-10-CM | POA: Diagnosis not present

## 2016-10-04 DIAGNOSIS — Z881 Allergy status to other antibiotic agents status: Secondary | ICD-10-CM | POA: Insufficient documentation

## 2016-10-04 DIAGNOSIS — Z79899 Other long term (current) drug therapy: Secondary | ICD-10-CM | POA: Insufficient documentation

## 2016-10-04 DIAGNOSIS — R9431 Abnormal electrocardiogram [ECG] [EKG]: Secondary | ICD-10-CM | POA: Diagnosis present

## 2016-10-04 DIAGNOSIS — G43909 Migraine, unspecified, not intractable, without status migrainosus: Secondary | ICD-10-CM | POA: Diagnosis not present

## 2016-10-04 DIAGNOSIS — Z8249 Family history of ischemic heart disease and other diseases of the circulatory system: Secondary | ICD-10-CM | POA: Insufficient documentation

## 2016-10-04 DIAGNOSIS — K255 Chronic or unspecified gastric ulcer with perforation: Secondary | ICD-10-CM | POA: Diagnosis not present

## 2016-10-04 DIAGNOSIS — N289 Disorder of kidney and ureter, unspecified: Secondary | ICD-10-CM | POA: Diagnosis not present

## 2016-10-04 DIAGNOSIS — I2510 Atherosclerotic heart disease of native coronary artery without angina pectoris: Secondary | ICD-10-CM | POA: Diagnosis not present

## 2016-10-04 DIAGNOSIS — Z882 Allergy status to sulfonamides status: Secondary | ICD-10-CM | POA: Insufficient documentation

## 2016-10-04 DIAGNOSIS — J209 Acute bronchitis, unspecified: Secondary | ICD-10-CM | POA: Diagnosis not present

## 2016-10-05 ENCOUNTER — Observation Stay (HOSPITAL_COMMUNITY)
Admission: EM | Admit: 2016-10-05 | Discharge: 2016-10-06 | Disposition: A | Discharge status: Home / Self Care | Payer: Medicaid Other | Attending: Internal Medicine | Admitting: Internal Medicine

## 2016-10-05 ENCOUNTER — Encounter (HOSPITAL_COMMUNITY): Payer: Self-pay | Admitting: Emergency Medicine

## 2016-10-05 ENCOUNTER — Emergency Department (HOSPITAL_COMMUNITY): Payer: Medicaid Other

## 2016-10-05 DIAGNOSIS — J40 Bronchitis, not specified as acute or chronic: Secondary | ICD-10-CM

## 2016-10-05 DIAGNOSIS — J440 Chronic obstructive pulmonary disease with acute lower respiratory infection: Secondary | ICD-10-CM

## 2016-10-05 DIAGNOSIS — R9431 Abnormal electrocardiogram [ECG] [EKG]: Secondary | ICD-10-CM

## 2016-10-05 DIAGNOSIS — F3160 Bipolar disorder, current episode mixed, unspecified: Secondary | ICD-10-CM | POA: Diagnosis present

## 2016-10-05 DIAGNOSIS — R0789 Other chest pain: Secondary | ICD-10-CM

## 2016-10-05 DIAGNOSIS — I959 Hypotension, unspecified: Secondary | ICD-10-CM | POA: Diagnosis not present

## 2016-10-05 DIAGNOSIS — G43909 Migraine, unspecified, not intractable, without status migrainosus: Secondary | ICD-10-CM

## 2016-10-05 DIAGNOSIS — J209 Acute bronchitis, unspecified: Secondary | ICD-10-CM

## 2016-10-05 DIAGNOSIS — R079 Chest pain, unspecified: Secondary | ICD-10-CM | POA: Diagnosis present

## 2016-10-05 DIAGNOSIS — D638 Anemia in other chronic diseases classified elsewhere: Secondary | ICD-10-CM | POA: Diagnosis present

## 2016-10-05 LAB — BASIC METABOLIC PANEL
ANION GAP: 12 (ref 5–15)
BUN: 12 mg/dL (ref 6–20)
CALCIUM: 9.4 mg/dL (ref 8.9–10.3)
CO2: 20 mmol/L — ABNORMAL LOW (ref 22–32)
Chloride: 106 mmol/L (ref 101–111)
Creatinine, Ser: 1 mg/dL (ref 0.44–1.00)
GFR calc Af Amer: >60 mL/min (ref >60)
Glucose, Bld: 105 mg/dL — ABNORMAL HIGH (ref 65–99)
POTASSIUM: 3.6 mmol/L (ref 3.5–5.1)
SODIUM: 138 mmol/L (ref 135–145)

## 2016-10-05 LAB — RAPID URINE DRUG SCREEN, HOSP PERFORMED
Amphetamines: NOT DETECTED
BENZODIAZEPINES: NOT DETECTED
Barbiturates: NOT DETECTED
COCAINE: POSITIVE — AB
OPIATES: POSITIVE — AB
Tetrahydrocannabinol: POSITIVE — AB

## 2016-10-05 LAB — URINALYSIS, ROUTINE W REFLEX MICROSCOPIC
BACTERIA UA: NONE SEEN
BILIRUBIN URINE: NEGATIVE
GLUCOSE, UA: NEGATIVE mg/dL
KETONES UR: NEGATIVE mg/dL
LEUKOCYTES UA: NEGATIVE
NITRITE: NEGATIVE
PROTEIN: NEGATIVE mg/dL
Specific Gravity, Urine: 1.041 — ABNORMAL HIGH (ref 1.005–1.030)
pH: 5 (ref 5.0–8.0)

## 2016-10-05 LAB — CREATININE, SERUM
Creatinine, Ser: 0.9 mg/dL (ref 0.44–1.00)
GFR calc non Af Amer: >60 mL/min (ref >60)

## 2016-10-05 LAB — CBC
HEMATOCRIT: 38 % (ref 36.0–46.0)
Hemoglobin: 12.8 g/dL (ref 12.0–15.0)
MCH: 29.6 pg (ref 26.0–34.0)
MCHC: 33.7 g/dL (ref 30.0–36.0)
MCV: 87.8 fL (ref 78.0–100.0)
Platelets: 424 10*3/uL — ABNORMAL HIGH (ref 150–400)
RBC: 4.33 MIL/uL (ref 3.87–5.11)
RDW: 16.2 % — AB (ref 11.5–15.5)
WBC: 13.3 10*3/uL — AB (ref 4.0–10.5)

## 2016-10-05 LAB — I-STAT CG4 LACTIC ACID, ED
LACTIC ACID, VENOUS: 0.8 mmol/L (ref 0.5–1.9)
LACTIC ACID, VENOUS: 1.79 mmol/L (ref 0.5–1.9)

## 2016-10-05 LAB — I-STAT TROPONIN, ED: Troponin i, poc: 0 ng/mL (ref 0.00–0.08)

## 2016-10-05 LAB — HEPARIN LEVEL (UNFRACTIONATED): Heparin Unfractionated: 0.49 IU/mL (ref 0.30–0.70)

## 2016-10-05 LAB — I-STAT BETA HCG BLOOD, ED (MC, WL, AP ONLY): I-stat hCG, quantitative: <5 m[IU]/mL (ref <5)

## 2016-10-05 LAB — TROPONIN I
Troponin I: <0.03 ng/mL (ref <0.03)
Troponin I: <0.03 ng/mL (ref <0.03)
Troponin I: <0.03 ng/mL (ref <0.03)

## 2016-10-05 LAB — LITHIUM LEVEL

## 2016-10-05 MED ORDER — ALBUTEROL SULFATE HFA 108 (90 BASE) MCG/ACT IN AERS: 2.0000 | INHALATION_SPRAY | Freq: Four times a day (QID) | RESPIRATORY_TRACT | Status: DC | PRN

## 2016-10-05 MED ORDER — NICOTINE 14 MG/24HR TD PT24
14.0000 mg | MEDICATED_PATCH | Freq: Every day | TRANSDERMAL | Status: DC
  Administered 2016-10-05: 14 mg via TRANSDERMAL
  Filled 2016-10-05 (×2): qty 1

## 2016-10-05 MED ORDER — ACETAMINOPHEN 500 MG PO TABS
1000.0000 mg | ORAL_TABLET | Freq: Once | ORAL | Status: AC
  Administered 2016-10-05: 1000 mg via ORAL
  Filled 2016-10-05: qty 2

## 2016-10-05 MED ORDER — SODIUM CHLORIDE 0.9 % IV SOLN
Freq: Once | INTRAVENOUS | Status: AC
  Administered 2016-10-05: 09:00:00 via INTRAVENOUS

## 2016-10-05 MED ORDER — SODIUM CHLORIDE 0.9 % IV BOLUS (SEPSIS)
1000.0000 mL | Freq: Once | INTRAVENOUS | Status: AC
  Administered 2016-10-05: 1000 mL via INTRAVENOUS

## 2016-10-05 MED ORDER — DICYCLOMINE HCL 20 MG PO TABS
20.0000 mg | ORAL_TABLET | Freq: Four times a day (QID) | ORAL | Status: DC
  Administered 2016-10-05 – 2016-10-06 (×6): 20 mg via ORAL
  Filled 2016-10-05 (×9): qty 1

## 2016-10-05 MED ORDER — GUAIFENESIN ER 600 MG PO TB12
600.0000 mg | ORAL_TABLET | Freq: Two times a day (BID) | ORAL | Status: DC
  Administered 2016-10-05 – 2016-10-06 (×3): 600 mg via ORAL
  Filled 2016-10-05 (×3): qty 1

## 2016-10-05 MED ORDER — MORPHINE SULFATE (PF) 4 MG/ML IV SOLN
2.0000 mg | Freq: Once | INTRAVENOUS | Status: AC
Start: 2016-10-05 — End: 2016-10-05
  Administered 2016-10-05: 2 mg via INTRAVENOUS
  Filled 2016-10-05: qty 1

## 2016-10-05 MED ORDER — ENOXAPARIN SODIUM 40 MG/0.4ML ~~LOC~~ SOLN
40.0000 mg | Freq: Every day | SUBCUTANEOUS | Status: DC
  Administered 2016-10-05: 40 mg via SUBCUTANEOUS
  Filled 2016-10-05: qty 0.4

## 2016-10-05 MED ORDER — FLUTICASONE FUROATE-VILANTEROL 100-25 MCG/INH IN AEPB
1.0000 | INHALATION_SPRAY | Freq: Every day | RESPIRATORY_TRACT | Status: DC
Start: 2016-10-05 — End: 2016-10-06
  Filled 2016-10-05: qty 28

## 2016-10-05 MED ORDER — HEPARIN (PORCINE) IN NACL 100-0.45 UNIT/ML-% IJ SOLN
1100.0000 [IU]/h | INTRAMUSCULAR | Status: DC
  Administered 2016-10-05 – 2016-10-06 (×2): 1100 [IU]/h via INTRAVENOUS
  Filled 2016-10-05 (×2): qty 250

## 2016-10-05 MED ORDER — PROMETHAZINE HCL 25 MG PO TABS: 25.0000 mg | ORAL_TABLET | Freq: Four times a day (QID) | ORAL | Status: DC | PRN

## 2016-10-05 MED ORDER — GI COCKTAIL ~~LOC~~: 30.0000 mL | Freq: Four times a day (QID) | ORAL | Status: DC | PRN

## 2016-10-05 MED ORDER — ALBUTEROL SULFATE (2.5 MG/3ML) 0.083% IN NEBU: 2.5000 mg | INHALATION_SOLUTION | Freq: Four times a day (QID) | RESPIRATORY_TRACT | Status: DC | PRN

## 2016-10-05 MED ORDER — NITROGLYCERIN 0.1 MG/HR TD PT24
0.1000 mg | MEDICATED_PATCH | Freq: Every day | TRANSDERMAL | Status: DC
  Administered 2016-10-05 – 2016-10-06 (×2): 0.1 mg via TRANSDERMAL
  Filled 2016-10-05 (×2): qty 1

## 2016-10-05 MED ORDER — AZITHROMYCIN 250 MG PO TABS
250.0000 mg | ORAL_TABLET | Freq: Every day | ORAL | Status: DC
  Administered 2016-10-06: 250 mg via ORAL
  Filled 2016-10-05: qty 1

## 2016-10-05 MED ORDER — MORPHINE SULFATE (PF) 2 MG/ML IV SOLN
2.0000 mg | INTRAVENOUS | Status: DC | PRN
  Administered 2016-10-05 (×3): 2 mg via INTRAVENOUS
  Filled 2016-10-05 (×3): qty 1

## 2016-10-05 MED ORDER — ZIPRASIDONE HCL 80 MG PO CAPS
160.0000 mg | ORAL_CAPSULE | Freq: Every day | ORAL | Status: DC
  Administered 2016-10-05: 160 mg via ORAL
  Filled 2016-10-05 (×2): qty 2

## 2016-10-05 MED ORDER — ONDANSETRON HCL 4 MG/2ML IJ SOLN: 4.0000 mg | Freq: Four times a day (QID) | INTRAMUSCULAR | Status: DC | PRN

## 2016-10-05 MED ORDER — POLYETHYLENE GLYCOL 3350 17 G PO PACK: 17.0000 g | PACK | Freq: Every day | ORAL | Status: DC | PRN

## 2016-10-05 MED ORDER — PNEUMOCOCCAL VAC POLYVALENT 25 MCG/0.5ML IJ INJ: 0.5000 mL | INJECTION | INTRAMUSCULAR | Status: DC | PRN

## 2016-10-05 MED ORDER — ZOLPIDEM TARTRATE 5 MG PO TABS
5.0000 mg | ORAL_TABLET | Freq: Every day | ORAL | Status: DC
  Administered 2016-10-05: 5 mg via ORAL
  Filled 2016-10-05: qty 1

## 2016-10-05 MED ORDER — HYDROXYZINE PAMOATE 50 MG PO CAPS
200.0000 mg | ORAL_CAPSULE | Freq: Every day | ORAL | Status: DC
  Administered 2016-10-05: 200 mg via ORAL
  Filled 2016-10-05 (×2): qty 4

## 2016-10-05 MED ORDER — AZITHROMYCIN 500 MG PO TABS
500.0000 mg | ORAL_TABLET | Freq: Every day | ORAL | Status: AC
  Administered 2016-10-05: 500 mg via ORAL
  Filled 2016-10-05: qty 1

## 2016-10-05 MED ORDER — ACETAMINOPHEN 325 MG PO TABS
650.0000 mg | ORAL_TABLET | ORAL | Status: DC | PRN
  Administered 2016-10-05: 650 mg via ORAL
  Filled 2016-10-05: qty 2

## 2016-10-05 MED ORDER — LITHIUM CARBONATE 300 MG PO CAPS
600.0000 mg | ORAL_CAPSULE | Freq: Two times a day (BID) | ORAL | Status: DC
  Administered 2016-10-05 – 2016-10-06 (×3): 600 mg via ORAL
  Filled 2016-10-05 (×4): qty 2

## 2016-10-05 MED ORDER — IOPAMIDOL (ISOVUE-370) INJECTION 76%
INTRAVENOUS | Status: AC
  Administered 2016-10-05: 100 mL
  Filled 2016-10-05: qty 100

## 2016-10-05 MED ORDER — ASPIRIN 81 MG PO CHEW
324.0000 mg | CHEWABLE_TABLET | Freq: Once | ORAL | Status: DC
  Filled 2016-10-05: qty 4

## 2016-10-05 NOTE — Progress Notes (Signed)
\  ANTICOAGULATION CONSULT NOTE - Initial Consult  Pharmacy Consult for heparin Indication: chest pain/ACS  Allergies  Allergen Reactions  . Erythromycin Swelling and Other (See Comments)    Tongue swelling   . Nsaids Other (See Comments)    BLEEDING (internally)  . Sulfa Antibiotics Swelling and Other (See Comments)    Swelling of the tongue    Patient Measurements: Height:  (172.7 cm) Weight: 237 lb 14 oz (107.9 kg) IBW/kg (Calculated) : 63.9 Heparin Dosing Weight: 88 kg  Vital Signs: BP: 95/66 (04/02 0630) Pulse Rate: 80 (04/02 0630)  Labs:  Recent Labs  10/05/16 0013 10/05/16 0523 10/05/16 0755  HGB 12.8  --   --   HCT 38.0  --   --   PLT 424*  --   --   CREATININE 1.00  --  0.90  TROPONINI  --  <0.03 <0.03  <0.03    Estimated Creatinine Clearance: 99.4 mL/min (by C-G formula based on SCr of 0.9 mg/dL).  Assessment: CC/HPI: 46 yo f presenting with CP  PMH: COPD, asthma, PUD, anemia, bipolar  Anticoag: none pta - iv hep for ACS r/o  CV: ACS r/o with abnormal EKG and CT showing LAD atherosclerosis - trop neg x 1  Renal: SCr 0.9  Heme/Onc: H&H 12.8/38, Plt 424  Goal of Therapy:  Heparin level 0.3-0.7 units/ml Monitor platelets by anticoagulation protocol: Yes   Plan:  Dc dvt prop enoxaparin Initiate heparin 1100 units/hr with no bolus d/t recent enox 2100 HL Daily HL, CBC F/U Cards plans  Isaac Bliss, PharmD, BCPS, BCCCP Clinical Pharmacist Clinical phone for 10/05/2016 from 7a-3:30p: [phone] If after 3:30p, please call main pharmacy at: [phone] 10/05/2016 12:27 PM

## 2016-10-05 NOTE — Progress Notes (Signed)
ANTICOAGULATION CONSULT NOTE - FOLLOW UP    HL = 0.49 (goal 0.3 - 0.7 units/mL) Heparin dosing weight = 88 kg   Assessment: 45 YOF with chest pain continues on IV heparin.  Heparin level is therapeutic; no bleeding reported.   Plan: - Continue heparin gtt at 1100 units/hr - F/U AM labs    Jacody Beneke D. Laney Potash, PharmD, BCPS 10/05/2016, 9:15 PM

## 2016-10-05 NOTE — H&P (Signed)
History and Physical    Anita Villarreal ZOX:[id number] DOB: 01/03/1971 DOA: 10/05/2016  PCP: Anita Irani, PA-C   Patient coming from: home  Chief Complaint:chest pain radiating to the back and cough PI: Anita Villarreal is a 46 y.o. female with medical history significant of COPD and asthma, PUD with h/o perforated ulcer, SBO, anemia bipolar d/o who presented to the ED with c/o chest pain. Patient reported an acucte onset of pain around 2 am this morning that was progressively worse and felt like something is ripping or tearing in her chest. Pain was associated with nausea, diaphoresis, dizziness. She also c/o cough productive of yellow sputum and SOB that have been present for near 2 weeks   ED Course: In the ED her BP initially was low 73/52 mmHG and it responded to IV fluids, patient was subfebrile with temperature 99.57F  Blood work demonstrated elevated WBC count 13,300, troponin was normal less than 0.03 EKG showed sinus rhythm with inferolateral ischemic ST-T wave changes Chest CT showed no aneurysm or definite dissection, LAD atherosclerosis, stable nodularities in the mesenteric fat of the left hemiabdomen associated with chronic reactive adenopathy  Chest x-ray didn't show any active cardiac cardiopulmonary disease  Review of Systems: As per HPI otherwise 10 point review of systems negative.   Ambulatory Status: Independent  Past Medical History:  Diagnosis Date  . Abscess of abdominal wall 06/2015  . Anemia    hx  . Asthma   . Bipolar 1 disorder (HCC)   . COPD (chronic obstructive pulmonary disease) (HCC)    Anita Villarreal 08/13/2016  . Gastric ulcer with perforation (HCC) 06/2015  . Headache    hx migraines  . Heart murmur    ? young  . Manic depressive disorder (HCC)   . Partial small bowel obstruction    multiple/notes 08/13/2016  . Pneumonia 2016  . Renal insufficiency     Past Surgical History:  Procedure Laterality Date  . APPENDECTOMY    . CHOLECYSTECTOMY  OPEN  05/2015  . EXPLORATORY LAPAROTOMY  06/04/15   Anita Villarreal patch repair of gastric ulcer  . HERNIA REPAIR    . INCISIONAL HERNIA REPAIR  08/13/2016   Open repair of complex incisional hernia with transversus abdominis release  And mesh/notes 2/8i/2018  . INCISIONAL HERNIA REPAIR N/A 08/13/2016   Procedure: HERNIA REPAIR INCISIONAL WITH TRANSVERSE ABDOMINUS RELEASE;  Surgeon: Anita Bouillon, MD;  Location: [facility] OR;  Service: General;  Laterality: N/A;  . INSERTION OF MESH N/A 08/13/2016   Procedure: INSERTION OF MESH;  Surgeon: Anita Bouillon, MD;  Location: [facility] OR;  Service: General;  Laterality: N/A;  . LAPAROTOMY N/A 06/04/2015   Procedure: EXPLORATORY LAPAROTOMY WITH REPAIROF GASTRIC ULCER AND BIOPSY OF GASTRIC ULCER;  Surgeon: Anita Bouillon, MD;  Location: [facility] OR;  Service: General;  Laterality: N/A;  . perforated bowel    . TUBAL LIGATION      Social History   Social History  . Marital status: Divorced    Spouse name: N/A  . Number of children: N/A  . Years of education: N/A   Occupational History  . Not on file.   Social History Main Topics  . Smoking status: Current Every Day Smoker    Packs/day: 0.50    Years: 31.00    Types: Cigarettes  . Smokeless tobacco: Never Used     Comment: has patch on now->2 months  . Alcohol use 2.4 oz/week    4 Cans of beer per week  . Drug use: No  .  Sexual activity: No   Other Topics Concern  . Not on file   Social History Narrative  . No narrative on file    Allergies  Allergen Reactions  . Erythromycin Swelling and Other (See Comments)    Tongue swelling   . Nsaids Other (See Comments)    BLEEDING (internally)  . Sulfa Antibiotics Swelling and Other (See Comments)    Swelling of the tongue    Family History  Problem Relation Age of Onset  . Diabetes Mother   . CAD Mother   . Aneurysm Maternal Grandmother     Prior to Admission medications   Medication Sig Start Date End Date Taking? Authorizing Provider  albuterol  (PROVENTIL HFA;VENTOLIN HFA) 108 (90 BASE) MCG/ACT inhaler Inhale 2 puffs into the lungs every 6 (six) hours as needed for wheezing or shortness of breath.     Historical Provider, MD  albuterol (PROVENTIL) (2.5 MG/3ML) 0.083% nebulizer solution Take 2.5 mg by nebulization every 6 (six) hours as needed for wheezing or shortness of breath.     Historical Provider, MD  budesonide-formoterol (SYMBICORT) 80-4.5 MCG/ACT inhaler Inhale 2 puffs into the lungs daily.     Historical Provider, MD  dicyclomine (BENTYL) 20 MG tablet Take 20 mg by mouth 4 (four) times daily.    Historical Provider, MD  hydrOXYzine (VISTARIL) 100 MG capsule Take 200 mg by mouth at bedtime.    Historical Provider, MD  lithium carbonate 300 MG capsule Take 600 mg by mouth 2 (two) times daily.     Historical Provider, MD  nicotine (NICODERM CQ - DOSED IN MG/24 HOURS) 21 mg/24hr patch Place 21 mg onto the skin daily.    Historical Provider, MD  oxyCODONE-acetaminophen (PERCOCET/ROXICET) 5-325 MG tablet Take 1 tablet by mouth every 4 (four) hours as needed for moderate pain. 08/17/16   Jerre Simon, PA  polyethylene glycol (MIRALAX / GLYCOLAX) packet Take 17 g by mouth daily as needed. Patient taking differently: Take 17 g by mouth daily as needed. For constipation. 07/27/16   Francine Graven Simaan, PA-C  promethazine (PHENERGAN) 25 MG tablet Take 25-50 mg by mouth See admin instructions. Along with each dose of Imitrex as needed for nausea accompanying migraines 04/07/13   Historical Provider, MD  SUMAtriptan (IMITREX) 100 MG tablet Take 100 mg by mouth See admin instructions. Once as needed for migraine (may repeat once in 2 hours if no relief) 12/20/14   Historical Provider, MD  ziprasidone (GEODON) 80 MG capsule Take 320 mg by mouth at bedtime.     Historical Provider, MD  zolpidem (AMBIEN) 10 MG tablet Take 10 mg by mouth at bedtime.    Historical Provider, MD    Physical Exam: Vitals:   10/05/16 0550 10/05/16 0600 10/05/16 0630  10/05/16 0658  BP: 103/73 102/71 95/66   Pulse: 80 80 80   Resp: (!) Temp:      TempSrc:      SpO2: 99%     Weight:    107.9 kg (237 lb 14 oz)  Height:     (1.727 m)     General: Appears apprehensive Eyes: PERRLA, EOMI, normal lids, iris ENT:  grossly normal hearing, lips & tongue, mucous membranes moist and intact Neck: no lymphoadenopathy, masses or thyromegaly Cardiovascular: RRR, no m/r/g. No JVD, carotid bruits. No LE edema.  Respiratory: bilateral no wheezes, rales, rhonchi or cracles. Normal respiratory effort. No accessory muscle use observed Abdomen: soft, non-tender, non-distended, no organomegaly or  masses appreciated. BS present in all quadrants Skin: no rash, ulcers or induration seen on limited exam Musculoskeletal: grossly normal tone BUE/BLE, good ROM, no bony abnormality or joint deformities observed Psychiatric: grossly normal mood and affect, speech fluent and appropriate, alert and oriented x3 Neurologic: CN II-XII grossly intact, moves all extremities in coordinated fashion, sensation intact  Labs on Admission: I have personally reviewed following labs and imaging studies  CBC, BMP  GFR: Estimated Creatinine Clearance: 99.4 mL/min (by C-G formula based on SCr of 0.9 mg/dL).   Creatinine Clearance: Estimated Creatinine Clearance: 99.4 mL/min (by C-G formula based on SCr of 0.9 mg/dL).    Radiological Exams on Admission: Dg Chest 2 View  Result Date: 10/05/2016 CLINICAL DATA:  Chest pain today EXAM: CHEST  2 VIEW COMPARISON:  06/18/2015 FINDINGS: The lungs are clear. The pulmonary vasculature is normal. Heart size is normal. Hilar and mediastinal contours are unremarkable. There is no pleural effusion. IMPRESSION: No active cardiopulmonary disease. Electronically Signed   By: Ellery Plunk M.D.   On: 10/05/2016 00:40   Ct Angio Chest/abd/pel For Dissection W And/or Wo Contrast  Result Date: 10/05/2016 CLINICAL DATA:  Chest pain, history  of pneumonia, COPD and renal insufficiency. History of cholecystectomy, appendectomy, hernia repair with mesh, tubal ligation and gastric ulcer perforation. EXAM: CT ANGIOGRAPHY CHEST, ABDOMEN AND PELVIS TECHNIQUE: Multidetector CT imaging through the chest, abdomen and pelvis was performed using the standard protocol during bolus administration of intravenous contrast. Multiplanar reconstructed images and MIPs were obtained and reviewed to evaluate the vascular anatomy. CONTRAST:  100 cc Isovue 370 IV COMPARISON:  CT abdomen and pelvis 07/25/2016 FINDINGS: CTA CHEST FINDINGS Cardiovascular: Preferential opacification of the thoracic aorta. Cardiac pulsation artifacts in typical location of the ascending aorta and main pulmonary artery which can simulate ascending thoracic dissections but given artifacts also seen within the adjacent main pulmonary artery, this is believed on likely to represent an aortic dissection. If clinical suspicion remains high, cardiac gated repeat imaging of the chest with IV contrast may help for better confirmation. No evidence of thoracic aortic aneurysm. Normal heart size. No pericardial effusion. No large central pulmonary embolus. Coronary arteriosclerosis along the LAD with tiny focus of air noted within likely iatrogenic from contrast administration. 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 Mediastinum/Nodes: No enlarged mediastinal, hilar, or axillary lymph nodes. Thyroid gland, trachea, and esophagus demonstrate no significant findings. Lungs/Pleura: Lungs are clear. No pleural effusion or pneumothorax. Musculoskeletal: No chest wall abnormality. No acute or significant osseous findings. Review of the MIP images confirms the above findings. CTA ABDOMEN AND PELVIS FINDINGS VASCULAR Aorta: Normal caliber aorta without aneurysm, dissection, vasculitis or significant stenosis. Celiac: Patent without evidence of aneurysm, dissection, vasculitis or  significant stenosis. SMA: Patent without evidence of aneurysm, dissection, vasculitis or significant stenosis. Renals: Both renal arteries are patent without evidence of aneurysm, dissection, vasculitis, fibromuscular dysplasia or significant stenosis. Single renal arteries to both kidneys. IMA: Patent without evidence of aneurysm, dissection, vasculitis or significant stenosis. Inflow: Patent without evidence of aneurysm, dissection, vasculitis or significant stenosis. Veins: No obvious venous abnormality within the limitations of this arterial phase study. Review of the MIP images confirms the above findings. NON-VASCULAR Hepatobiliary: No focal liver abnormality is seen. Status post cholecystectomy. No biliary dilatation. Pancreas: Unremarkable. No pancreatic ductal dilatation or surrounding inflammatory changes. Spleen: Normal in size without focal abnormality. Adrenals/Urinary Tract: Adrenal glands are unremarkable. Kidneys are normal, without renal calculi, focal lesion, or hydronephrosis. Bladder is contracted  in appearance and unremarkable. Stomach/Bowel: Contracted stomach. Normal small bowel rotation without small bowel dilatation or inflammation. No bowel herniation. Status post appendectomy. Lymphatic: Stable nodularity noted within the left mesenteric fat, the largest measuring 2.4 x 1.6 cm versus 2.2 x 1.5 cm previously, unchanged appearance and may reflect mild reactive adenopathy. Reproductive: Uterus and bilateral adnexa are unremarkable. Other: Postsurgical change overlying ventral mesh repair. No recurrent hernia noted. Small fat containing umbilical hernia. Musculoskeletal: No acute or significant osseous findings. Review of the MIP images confirms the above findings. IMPRESSION: 1. No aneurysm of the aorta nor definite dissection. There are cardiac motion artifacts along the ascending aorta and main pulmonary artery which can limit assessment. If clinical suspicion remains high for aortic  dissection, cardiac gated repeat imaging of chest with IV contrast may help. Findings however are believed due to artifact given its presence within both the ascending aorta and pulmonary artery. 2. Aberrant right subclavian artery coursing retroesophageal. 3. LAD coronary arteriosclerosis. 4. No acute vascular abnormality within the abdomen or pelvis. Evidence of aortic aneurysm or dissection within the abdomen pelvis. No significant branch vessel stenosis nor aneurysm. 5. Mesh repair ventral hernias without recurrent bowel herniation. 6. Stable nodularity within the mesenteric fat of the left hemiabdomen possibly representing chronic reactive adenopathy the largest approximately 2.4 x 1.6 cm versus 2.2 x 1.5 cm previously. Electronically Signed   By: Tollie Eth M.D.   On: 10/05/2016 02:52    EKG: Independently reviewed - sinus rhythm with inferolateral ST-T wave changes, somewhat worse than ont he previous EKG on 08/12/2016   Assessment/Plan Principal Problem:   Chest pain Active Problems:   Bipolar I disorder, most recent episode mixed (HCC)   Anemia of chronic disease   Migraine headache   Chest pain - patient is being admitted under rule out ACS protocol Given the dynamic EKG changes, will continue cycling cardiac enzymes, UDS is pending Will ask cardiology to see patient given her abnormal EKG, ongoing chest pain and CT demonstrating LAD atherosclerosis  Presumed acute bronchitis given patient's history of COPD, complaints of ongoing cough productive by mouth sputum during the last 2 weeks, several febrile temperature and elevated white blood cells count We'll start on antibiotic, nebulizers as needed and guaifenesin  Anemia  of chronic disease - currently stable, continue to monitor  Bipolar disorder - continue Lithium and check its level, continue geodon  Migraine headache - currently stable, asymptomatic On Imitrex and Phenergan at home, would refrain from giving Imitrex in light of  possible CAD   DVT prophylaxis: Lovenox Code Status: Full Family Communication: None  Disposition Plan: Telemetry Consults called: Cardiology Admission status: Observation   Vinetta Bergamo Pager: [phone] [facility]  If 7PM-7AM, please contact night-coverage [email or web address] Password [facility]  10/05/2016, 9:27 AM

## 2016-10-05 NOTE — ED Provider Notes (Signed)
[facility]-EMERGENCY DEPT Provider Note   CSN: [id number] Arrival date & time: 10/04/16  2355     History   Chief Complaint Chief Complaint  Patient presents with  . Chest Pain  . Cough    HPI Anita Villarreal is a 46 y.o. female with a hx of Bipolar disorder, anemia, small bowel obstruction, gastric ulcer with perforation, asthma presents to the Emergency Department complaining of gradual, persistent, progressively worsening chest pain onset 2 PM this afternoon. Patient reports that occurred acutely. She states it is sharp, rated at 10/10. She is unsure if it radiates to her back. She denies ripping or tearing sensation. She reports 2 weeks of cough productive of yellow sputum with some associated shortness of breath.  Today she reports associated nausea diaphoresis and dizziness since the onset of her pain.  No guarding or alleviating factors.  She denies fevers at home.   The history is provided by the patient and medical records. No language interpreter was used.  Cough  Associated symptoms include chest pain and shortness of breath.    Past Medical History:  Diagnosis Date  . Abscess of abdominal wall 06/2015  . Anemia    hx  . Asthma   . Bipolar 1 disorder (HCC)   . COPD (chronic obstructive pulmonary disease) (HCC)    Hattie Perch 08/13/2016  . Gastric ulcer with perforation (HCC) 06/2015  . Headache    hx migraines  . Heart murmur    ? young  . Manic depressive disorder (HCC)   . Partial small bowel obstruction    multiple/notes 08/13/2016  . Pneumonia 2016  . Renal insufficiency     Patient Active Problem List   Diagnosis Date Noted  . Nausea & vomiting 07/31/2016  . Incisional hernia 07/25/2016  . Postoperative ileus (HCC) 06/27/2015  . Gastric leak 06/27/2015  . Anemia of chronic disease 06/27/2015  . Hypokalemia 06/17/2015  . HCAP (healthcare-associated pneumonia) 06/17/2015  . Pneumonia 06/17/2015  . Intra-abdominal infection   . Abdominal abscess (HCC)   .  Bipolar I disorder, most recent episode mixed (HCC) 06/07/2015  . Acute gastric perforation (HCC) 06/04/2015    Past Surgical History:  Procedure Laterality Date  . APPENDECTOMY    . CHOLECYSTECTOMY OPEN  05/2015  . EXPLORATORY LAPAROTOMY  06/04/15   Cheree Ditto patch repair of gastric ulcer  . HERNIA REPAIR    . INCISIONAL HERNIA REPAIR  08/13/2016   Open repair of complex incisional hernia with transversus abdominis release  And mesh/notes 2/8i/2018  . INCISIONAL HERNIA REPAIR N/A 08/13/2016   Procedure: HERNIA REPAIR INCISIONAL WITH TRANSVERSE ABDOMINUS RELEASE;  Surgeon: Harriette Bouillon, MD;  Location: [facility] OR;  Service: General;  Laterality: N/A;  . INSERTION OF MESH N/A 08/13/2016   Procedure: INSERTION OF MESH;  Surgeon: Harriette Bouillon, MD;  Location: [facility] OR;  Service: General;  Laterality: N/A;  . LAPAROTOMY N/A 06/04/2015   Procedure: EXPLORATORY LAPAROTOMY WITH REPAIROF GASTRIC ULCER AND BIOPSY OF GASTRIC ULCER;  Surgeon: Harriette Bouillon, MD;  Location: [facility] OR;  Service: General;  Laterality: N/A;  . perforated bowel    . TUBAL LIGATION      OB History    No data available       Home Medications    Prior to Admission medications   Medication Sig Start Date End Date Taking? Authorizing Provider  albuterol (PROVENTIL HFA;VENTOLIN HFA) 108 (90 BASE) MCG/ACT inhaler Inhale 2 puffs into the lungs every 6 (six) hours as needed for wheezing or shortness of  breath.     Historical Provider, MD  albuterol (PROVENTIL) (2.5 MG/3ML) 0.083% nebulizer solution Take 2.5 mg by nebulization every 6 (six) hours as needed for wheezing or shortness of breath.     Historical Provider, MD  budesonide-formoterol (SYMBICORT) 80-4.5 MCG/ACT inhaler Inhale 2 puffs into the lungs daily.     Historical Provider, MD  dicyclomine (BENTYL) 20 MG tablet Take 20 mg by mouth 4 (four) times daily.    Historical Provider, MD  hydrOXYzine (VISTARIL) 100 MG capsule Take 200 mg by mouth at bedtime.    Historical Provider,  MD  lithium carbonate 300 MG capsule Take 600 mg by mouth 2 (two) times daily.     Historical Provider, MD  nicotine (NICODERM CQ - DOSED IN MG/24 HOURS) 21 mg/24hr patch Place 21 mg onto the skin daily.    Historical Provider, MD  oxyCODONE-acetaminophen (PERCOCET/ROXICET) 5-325 MG tablet Take 1 tablet by mouth every 4 (four) hours as needed for moderate pain. 08/17/16   Jerre Simon, PA  polyethylene glycol (MIRALAX / GLYCOLAX) packet Take 17 g by mouth daily as needed. Patient taking differently: Take 17 g by mouth daily as needed. For constipation. 07/27/16   Francine Graven Simaan, PA-C  promethazine (PHENERGAN) 25 MG tablet Take 25-50 mg by mouth See admin instructions. Along with each dose of Imitrex as needed for nausea accompanying migraines 04/07/13   Historical Provider, MD  SUMAtriptan (IMITREX) 100 MG tablet Take 100 mg by mouth See admin instructions. Once as needed for migraine (may repeat once in 2 hours if no relief) 12/20/14   Historical Provider, MD  ziprasidone (GEODON) 80 MG capsule Take 320 mg by mouth at bedtime.     Historical Provider, MD  zolpidem (AMBIEN) 10 MG tablet Take 10 mg by mouth at bedtime.    Historical Provider, MD    Family History Family History  Problem Relation Age of Onset  . Diabetes Mother   . CAD Mother   . Aneurysm Maternal Grandmother     Social History Social History  Substance Use Topics  . Smoking status: Current Every Day Smoker    Packs/day: 0.50    Years: 31.00    Types: Cigarettes  . Smokeless tobacco: Never Used     Comment: has patch on now->2 months  . Alcohol use 2.4 oz/week    4 Cans of beer per week     Allergies   Erythromycin; Nsaids; and Sulfa antibiotics   Review of Systems Review of Systems  Respiratory: Positive for cough, chest tightness and shortness of breath.   Cardiovascular: Positive for chest pain. Negative for palpitations and leg swelling.  Gastrointestinal: Negative for abdominal pain and constipation.    All other systems reviewed and are negative.    Physical Exam Updated Vital Signs BP (!) 80/48 (BP Location: Left Arm)   Pulse 94   Temp 99.5 F (37.5 C) (Oral)   Resp 18   Ht  (1.727 m)   Wt 110.2 kg   LMP 04/04/2015 Comment: neg hcg- tubes tied  SpO2 100%   BMI 36.95 kg/m   Physical Exam  Constitutional: She appears well-developed and well-nourished. No distress.  Awake, alert, nontoxic appearance patient very anxious  HENT:  Head: Normocephalic and atraumatic.  Mouth/Throat: Oropharynx is clear and moist. No oropharyngeal exudate.  Eyes: Conjunctivae are normal. No scleral icterus.  Neck: Normal range of motion. Neck supple.  Cardiovascular: Normal rate, regular rhythm and intact distal pulses.   Pulmonary/Chest: Effort normal  and breath sounds normal. No respiratory distress. She has no wheezes.  Equal chest expansion  Abdominal: Soft. Bowel sounds are normal. She exhibits no mass. There is no tenderness. There is no rebound and no guarding.  Well-healed surgical incision to the epigastrium Med tenderness to palpation  Musculoskeletal: Normal range of motion. She exhibits no edema.  No peripheral edema, no calf tenderness, negative Homans sign  Neurological: She is alert.  Speech is clear and goal oriented Moves extremities without ataxia  Skin: Skin is warm. She is diaphoretic.  Psychiatric: She has a normal mood and affect.  Nursing note and vitals reviewed.    ED Treatments / Results  Labs (all labs ordered are listed, but only abnormal results are displayed) Labs Reviewed  BASIC METABOLIC PANEL - Abnormal; Notable for the following:       Result Value   CO2 20 (*)    Glucose, Bld 105 (*)    All other components within normal limits  CBC - Abnormal; Notable for the following:    WBC 13.3 (*)    RDW 16.2 (*)    Platelets 424 (*)    All other components within normal limits  TROPONIN I  TROPONIN I  TROPONIN I  RAPID URINE DRUG SCREEN, HOSP  PERFORMED  I-STAT TROPOININ, ED  I-STAT CG4 LACTIC ACID, ED  I-STAT BETA HCG BLOOD, ED ([facility], [facility], [facility] ONLY)  I-STAT CG4 LACTIC ACID, ED    EKG  EKG Interpretation  Date/Time:  Monday October 05 2016 00:00:25 EDT Ventricular Rate:  108 PR Interval:  132 QRS Duration: 74 QT Interval:  340 QTC Calculation: 455 R Axis:   50 Text Interpretation:  Sinus tachycardia T wave abnormality, consider inferior ischemia T wave abnormality, consider anterolateral ischemia Abnormal ECG No STEMI. New T wave changes noted.  Confirmed by LONG MD, JOSHUA [id number]) on 10/05/2016 12:27:19 AM       Radiology Dg Chest 2 View  Result Date: 10/05/2016 CLINICAL DATA:  Chest pain today EXAM: CHEST  2 VIEW COMPARISON:  06/18/2015 FINDINGS: The lungs are clear. The pulmonary vasculature is normal. Heart size is normal. Hilar and mediastinal contours are unremarkable. There is no pleural effusion. IMPRESSION: No active cardiopulmonary disease. Electronically Signed   By: Ellery Plunk M.D.   On: 10/05/2016 00:40   Ct Angio Chest/abd/pel For Dissection W And/or Wo Contrast  Result Date: 10/05/2016 CLINICAL DATA:  Chest pain, history of pneumonia, COPD and renal insufficiency. History of cholecystectomy, appendectomy, hernia repair with mesh, tubal ligation and gastric ulcer perforation. EXAM: CT ANGIOGRAPHY CHEST, ABDOMEN AND PELVIS TECHNIQUE: Multidetector CT imaging through the chest, abdomen and pelvis was performed using the standard protocol during bolus administration of intravenous contrast. Multiplanar reconstructed images and MIPs were obtained and reviewed to evaluate the vascular anatomy. CONTRAST:  100 cc Isovue 370 IV COMPARISON:  CT abdomen and pelvis 07/25/2016 FINDINGS: CTA CHEST FINDINGS Cardiovascular: Preferential opacification of the thoracic aorta. Cardiac pulsation artifacts in typical location of the ascending aorta and main pulmonary artery which can simulate ascending thoracic dissections but given  artifacts also seen within the adjacent main pulmonary artery, this is believed on likely to represent an aortic dissection. If clinical suspicion remains high, cardiac gated repeat imaging of the chest with IV contrast may help for better confirmation. No evidence of thoracic aortic aneurysm. Normal heart size. No pericardial effusion. No large central pulmonary embolus. Coronary arteriosclerosis along the LAD with tiny focus of air noted within likely iatrogenic from contrast administration.  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 Mediastinum/Nodes: No enlarged mediastinal, hilar, or axillary lymph nodes. Thyroid gland, trachea, and esophagus demonstrate no significant findings. Lungs/Pleura: Lungs are clear. No pleural effusion or pneumothorax. Musculoskeletal: No chest wall abnormality. No acute or significant osseous findings. Review of the MIP images confirms the above findings. CTA ABDOMEN AND PELVIS FINDINGS VASCULAR Aorta: Normal caliber aorta without aneurysm, dissection, vasculitis or significant stenosis. Celiac: Patent without evidence of aneurysm, dissection, vasculitis or significant stenosis. SMA: Patent without evidence of aneurysm, dissection, vasculitis or significant stenosis. Renals: Both renal arteries are patent without evidence of aneurysm, dissection, vasculitis, fibromuscular dysplasia or significant stenosis. Single renal arteries to both kidneys. IMA: Patent without evidence of aneurysm, dissection, vasculitis or significant stenosis. Inflow: Patent without evidence of aneurysm, dissection, vasculitis or significant stenosis. Veins: No obvious venous abnormality within the limitations of this arterial phase study. Review of the MIP images confirms the above findings. NON-VASCULAR Hepatobiliary: No focal liver abnormality is seen. Status post cholecystectomy. No biliary dilatation. Pancreas: Unremarkable. No pancreatic ductal dilatation or surrounding  inflammatory changes. Spleen: Normal in size without focal abnormality. Adrenals/Urinary Tract: Adrenal glands are unremarkable. Kidneys are normal, without renal calculi, focal lesion, or hydronephrosis. Bladder is contracted in appearance and unremarkable. Stomach/Bowel: Contracted stomach. Normal small bowel rotation without small bowel dilatation or inflammation. No bowel herniation. Status post appendectomy. Lymphatic: Stable nodularity noted within the left mesenteric fat, the largest measuring 2.4 x 1.6 cm versus 2.2 x 1.5 cm previously, unchanged appearance and may reflect mild reactive adenopathy. Reproductive: Uterus and bilateral adnexa are unremarkable. Other: Postsurgical change overlying ventral mesh repair. No recurrent hernia noted. Small fat containing umbilical hernia. Musculoskeletal: No acute or significant osseous findings. Review of the MIP images confirms the above findings. IMPRESSION: 1. No aneurysm of the aorta nor definite dissection. There are cardiac motion artifacts along the ascending aorta and main pulmonary artery which can limit assessment. If clinical suspicion remains high for aortic dissection, cardiac gated repeat imaging of chest with IV contrast may help. Findings however are believed due to artifact given its presence within both the ascending aorta and pulmonary artery. 2. Aberrant right subclavian artery coursing retroesophageal. 3. LAD coronary arteriosclerosis. 4. No acute vascular abnormality within the abdomen or pelvis. Evidence of aortic aneurysm or dissection within the abdomen pelvis. No significant branch vessel stenosis nor aneurysm. 5. Mesh repair ventral hernias without recurrent bowel herniation. 6. Stable nodularity within the mesenteric fat of the left hemiabdomen possibly representing chronic reactive adenopathy the largest approximately 2.4 x 1.6 cm versus 2.2 x 1.5 cm previously. Electronically Signed   By: Tollie Eth M.D.   On: 10/05/2016 02:52     Procedures Procedures (including critical care time)  Medications Ordered in ED Medications  aspirin chewable tablet 324 mg (324 mg Oral Refused 10/05/16 0048)  0.9 %  sodium chloride infusion (not administered)  sodium chloride 0.9 % bolus 1,000 mL (0 mLs Intravenous Stopped 10/05/16 0137)  acetaminophen (TYLENOL) tablet 1,000 mg (1,000 mg Oral Given 10/05/16 0136)  iopamidol (ISOVUE-370) 76 % injection (100 mLs  Contrast Given 10/05/16 0148)  morphine 4 MG/ML injection 2 mg (2 mg Intravenous Given 10/05/16 0246)     Initial Impression / Assessment and Plan / ED Course  I have reviewed the triage vital signs and the nursing notes.  Pertinent labs & imaging results that were available during my care of the patient were reviewed by me and considered in my medical decision making (  see chart for details).  Clinical Course as of Oct 05 457  Mon Oct 05, 2016  4098 Heart Score 5  [HM]  [phone] Discussed with Dr. Katrinka Blazing who will admit.  [HM]    Clinical Course User Index [HM] Dierdre Forth, PA-C    Patient reports sudden onset chest pain earlier this afternoon. EKG is concerning with inverted T waves and evidence of ischemia in the inferior and lateral portions. Patient given aspirin.  We'll obtain CT scan for assessment of a dissection.  Can without evidence of dissection. Patient is chest pain-free at this time however concerns for hypotension and EKG changes. Heart score with moderate risk. Patient also had leukocytosis. She will need chest pain rule out. Will admit.  BP 94/64   Pulse 88   Temp 99.5 F (37.5 C) (Oral)   Resp 19   Ht  (1.727 m)   Wt 110.2 kg   LMP 04/04/2015 Comment: neg hcg- tubes tied  SpO2 99%   BMI 36.95 kg/m   The patient was discussed with and seen by Dr. Jacqulyn Bath who agrees with the treatment plan.   Final Clinical Impressions(s) / ED Diagnoses   Final diagnoses:  Other chest pain  Abnormal ECG  Hypotension, unspecified hypotension type    New  Prescriptions New Prescriptions   No medications on file     Dierdre Forth, PA-C 10/05/16 0459    Maia Plan, MD 10/05/16 [id number]

## 2016-10-05 NOTE — Consult Note (Signed)
Reason for Consult:atypical chest pain/abnormal EKG and CT angiography suggestive of LAD atherosclerosis Referring Physician:tri[facility] hospitalist Anita Villarreal is an 46 y.o. female.  GGE:[id number] is 46 year old female with past medical history significant for bipolar disorder, COPD/bronchial asthma irritable bowel syndrome, history of peptic ulcer disease, tobacco abuse, cocaine abuse,was admitted earlier today because of pleuritic chest pain associated with nausea, diaphoresis and dizziness.  Patient also complains of coughing associated with yellowishphlegm and mild shortness of breath for the past 2 weeks.  Patient states chest pain also radiates to the back and increases with movement.  Patient had CT and 2, which showed LAD atherosclerosis.  EKG done in the ED showed normal sinus rhythm with ST-T wave changes in anterolateral and inferior leads, which were more prominent as compared to prior EKG.  Cardiac enzymes so far have been negative.  Past Medical History:  Diagnosis Date  . Abscess of abdominal wall 06/2015  . Anemia    hx  . Asthma   . Bipolar 1 disorder ([place])   . COPD (chronic obstructive pulmonary disease) ([place])    Archie Endo 08/13/2016  . Gastric ulcer with perforation ([place]) 06/2015  . Headache    hx migraines  . Heart murmur    ? young  . Manic depressive disorder ([place])   . Partial small bowel obstruction    multiple/notes 08/13/2016  . Pneumonia 2016  . Renal insufficiency     Past Surgical History:  Procedure Laterality Date  . APPENDECTOMY    . CHOLECYSTECTOMY OPEN  05/2015  . EXPLORATORY LAPAROTOMY  06/04/15   Phillip Heal patch repair of gastric ulcer  . HERNIA REPAIR    . INCISIONAL HERNIA REPAIR  08/13/2016   Open repair of complex incisional hernia with transversus abdominis release  And mesh/notes 2/8i/2018  . INCISIONAL HERNIA REPAIR N/A 08/13/2016   Procedure: HERNIA REPAIR INCISIONAL WITH TRANSVERSE ABDOMINUS RELEASE;  Surgeon: Erroll Luna, MD;  Location: [facility];   Service: General;  Laterality: N/A;  . INSERTION OF MESH N/A 08/13/2016   Procedure: INSERTION OF MESH;  Surgeon: Erroll Luna, MD;  Location: [facility];  Service: General;  Laterality: N/A;  . LAPAROTOMY N/A 06/04/2015   Procedure: EXPLORATORY LAPAROTOMY WITH REPAIROF GASTRIC ULCER AND BIOPSY OF GASTRIC ULCER;  Surgeon: Erroll Luna, MD;  Location: [facility];  Service: General;  Laterality: N/A;  . perforated bowel    . TUBAL LIGATION      Family History  Problem Relation Age of Onset  . Diabetes Mother   . CAD Mother   . Aneurysm Maternal Grandmother     Social History:  reports that she has been smoking Cigarettes.  She has a 15.50 pack-year smoking history. She has never used smokeless tobacco. She reports that she drinks about 2.4 oz of alcohol per week . She reports that she does not use drugs.  Allergies:  Allergies  Allergen Reactions  . Erythromycin Swelling and Other (See Comments)    Tongue swelling   . Nsaids Other (See Comments)    BLEEDING (internally)  . Sulfa Antibiotics Swelling and Other (See Comments)    Swelling of the tongue    Medications: I have reviewed the patient's current medications.  Results for orders placed or performed during the hospital encounter of 10/05/16 (from the past 48 hour(s))  Basic metabolic panel     Status: Abnormal   Collection Time: 10/05/16 12:13 AM  Result Value Ref Range   Sodium 138 135 - 145 mmol/L   Potassium 3.6 3.5 -  5.1 mmol/L   Chloride 106 101 - 111 mmol/L   CO2 20 (L) 22 - 32 mmol/L   Glucose, Bld 105 (H) 65 - 99 mg/dL   BUN 12 6 - 20 mg/dL   Creatinine, Ser 1.00 0.44 - 1.00 mg/dL   Calcium 9.4 8.9 - 10.3 mg/dL   GFR calc non Af Amer >60 >60 mL/min   GFR calc Af Amer >60 >60 mL/min    Comment: (NOTE) The eGFR has been calculated using the CKD EPI equation. This calculation has not been validated in all clinical situations. eGFR's persistently <60 mL/min signify possible Chronic Kidney Disease.    Anion gap 12 5 -  15  CBC     Status: Abnormal   Collection Time: 10/05/16 12:13 AM  Result Value Ref Range   WBC 13.3 (H) 4.0 - 10.5 K/uL   RBC 4.33 3.87 - 5.11 MIL/uL   Hemoglobin 12.8 12.0 - 15.0 g/dL   HCT 38.0 36.0 - 46.0 %   MCV 87.8 78.0 - 100.0 fL   MCH 29.6 26.0 - 34.0 pg   MCHC 33.7 30.0 - 36.0 g/dL   RDW 16.2 (H) 11.5 - 15.5 %   Platelets 424 (H) 150 - 400 K/uL  I-stat troponin, ED     Status: None   Collection Time: 10/05/16 12:17 AM  Result Value Ref Range   Troponin i, poc 0.00 0.00 - 0.08 ng/mL   Comment 3            Comment: Due to the release kinetics of cTnI, a negative result within the first hours of the onset of symptoms does not rule out myocardial infarction with certainty. If myocardial infarction is still suspected, repeat the test at appropriate intervals.   I-Stat CG4 Lactic Acid, ED     Status: None   Collection Time: 10/05/16 12:31 AM  Result Value Ref Range   Lactic Acid, Venous 1.79 0.5 - 1.9 mmol/L  I-Stat Beta hCG blood, ED ([facility], [facility], [facility] only)     Status: None   Collection Time: 10/05/16 12:33 AM  Result Value Ref Range   I-stat hCG, quantitative <5.0 <5 mIU/mL   Comment 3            Comment:   GEST. AGE      CONC.  (mIU/mL)   <=1 WEEK        5 - 50     2 WEEKS       50 - 500     3 WEEKS       100 - 10,000     4 WEEKS     1,000 - 30,000        FEMALE AND NON-PREGNANT FEMALE:     LESS THAN 5 mIU/mL   Troponin I     Status: None   Collection Time: 10/05/16  5:23 AM  Result Value Ref Range   Troponin I <0.03 <0.03 ng/mL  I-Stat CG4 Lactic Acid, ED     Status: None   Collection Time: 10/05/16  5:29 AM  Result Value Ref Range   Lactic Acid, Venous 0.80 0.5 - 1.9 mmol/L  Troponin I-serum (0, 3, 6 hours)     Status: None   Collection Time: 10/05/16  7:55 AM  Result Value Ref Range   Troponin I <0.03 <0.03 ng/mL  Troponin I-serum (0, 3, 6 hours)     Status: None   Collection Time: 10/05/16  7:55 AM  Result Value Ref Range  Troponin I <0.03 <0.03 ng/mL   Creatinine, serum     Status: None   Collection Time: 10/05/16  7:55 AM  Result Value Ref Range   Creatinine, Ser 0.90 0.44 - 1.00 mg/dL   GFR calc non Af Amer >60 >60 mL/min   GFR calc Af Amer >60 >60 mL/min    Comment: (NOTE) The eGFR has been calculated using the CKD EPI equation. This calculation has not been validated in all clinical situations. eGFR's persistently <60 mL/min signify possible Chronic Kidney Disease.   Lithium level     Status: Abnormal   Collection Time: 10/05/16  9:38 AM  Result Value Ref Range   Lithium Lvl <0.06 (L) 0.60 - 1.20 mmol/L  Troponin I-serum (0, 3, 6 hours)     Status: None   Collection Time: 10/05/16  1:17 PM  Result Value Ref Range   Troponin I <0.03 <0.03 ng/mL  Urinalysis, Routine w reflex microscopic     Status: Abnormal   Collection Time: 10/05/16  3:18 PM  Result Value Ref Range   Color, Urine YELLOW YELLOW   APPearance CLEAR CLEAR   Specific Gravity, Urine 1.041 (H) 1.005 - 1.030   pH 5.0 5.0 - 8.0   Glucose, UA NEGATIVE NEGATIVE mg/dL   Hgb urine dipstick SMALL (A) NEGATIVE   Bilirubin Urine NEGATIVE NEGATIVE   Ketones, ur NEGATIVE NEGATIVE mg/dL   Protein, ur NEGATIVE NEGATIVE mg/dL   Nitrite NEGATIVE NEGATIVE   Leukocytes, UA NEGATIVE NEGATIVE   RBC / HPF 0-5 0 - 5 RBC/hpf   WBC, UA 0-5 0 - 5 WBC/hpf   Bacteria, UA NONE SEEN NONE SEEN   Squamous Epithelial / LPF 0-5 (A) NONE SEEN   Mucous PRESENT    Hyaline Casts, UA PRESENT   Urine rapid drug screen (hosp performed)     Status: Abnormal   Collection Time: 10/05/16  3:18 PM  Result Value Ref Range   Opiates POSITIVE (A) NONE DETECTED   Cocaine POSITIVE (A) NONE DETECTED   Benzodiazepines NONE DETECTED NONE DETECTED   Amphetamines NONE DETECTED NONE DETECTED   Tetrahydrocannabinol POSITIVE (A) NONE DETECTED   Barbiturates NONE DETECTED NONE DETECTED    Comment:        DRUG SCREEN FOR MEDICAL PURPOSES ONLY.  IF CONFIRMATION IS NEEDED FOR ANY PURPOSE, NOTIFY LAB WITHIN  5 DAYS.        LOWEST DETECTABLE LIMITS FOR URINE DRUG SCREEN Drug Class       Cutoff (ng/mL) Amphetamine      1000 Barbiturate      200 Benzodiazepine   546 Tricyclics       270 Opiates          300 Cocaine          300 THC              50     Dg Chest 2 View  Result Date: 10/05/2016 CLINICAL DATA:  Chest pain today EXAM: CHEST  2 VIEW COMPARISON:  06/18/2015 FINDINGS: The lungs are clear. The pulmonary vasculature is normal. Heart size is normal. Hilar and mediastinal contours are unremarkable. There is no pleural effusion. IMPRESSION: No active cardiopulmonary disease. Electronically Signed   By: Andreas Newport M.D.   On: 10/05/2016 00:40   Ct Angio Chest/abd/pel For Dissection W And/or Wo Contrast  Result Date: 10/05/2016 CLINICAL DATA:  Chest pain, history of pneumonia, COPD and renal insufficiency. History of cholecystectomy, appendectomy, hernia repair with mesh, tubal ligation and gastric ulcer  perforation. EXAM: CT ANGIOGRAPHY CHEST, ABDOMEN AND PELVIS TECHNIQUE: Multidetector CT imaging through the chest, abdomen and pelvis was performed using the standard protocol during bolus administration of intravenous contrast. Multiplanar reconstructed images and MIPs were obtained and reviewed to evaluate the vascular anatomy. CONTRAST:  100 cc Isovue 370 IV COMPARISON:  CT abdomen and pelvis 07/25/2016 FINDINGS: CTA CHEST FINDINGS Cardiovascular: Preferential opacification of the thoracic aorta. Cardiac pulsation artifacts in typical location of the ascending aorta and main pulmonary artery which can simulate ascending thoracic dissections but given artifacts also seen within the adjacent main pulmonary artery, this is believed on likely to represent an aortic dissection. If clinical suspicion remains high, cardiac gated repeat imaging of the chest with IV contrast may help for better confirmation. No evidence of thoracic aortic aneurysm. Normal heart size. No pericardial effusion. No large  central pulmonary embolus. Coronary arteriosclerosis along the LAD with tiny focus of air noted within likely iatrogenic from contrast administration. 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 Mediastinum/Nodes: No enlarged mediastinal, hilar, or axillary lymph nodes. Thyroid gland, trachea, and esophagus demonstrate no significant findings. Lungs/Pleura: Lungs are clear. No pleural effusion or pneumothorax. Musculoskeletal: No chest wall abnormality. No acute or significant osseous findings. Review of the MIP images confirms the above findings. CTA ABDOMEN AND PELVIS FINDINGS VASCULAR Aorta: Normal caliber aorta without aneurysm, dissection, vasculitis or significant stenosis. Celiac: Patent without evidence of aneurysm, dissection, vasculitis or significant stenosis. SMA: Patent without evidence of aneurysm, dissection, vasculitis or significant stenosis. Renals: Both renal arteries are patent without evidence of aneurysm, dissection, vasculitis, fibromuscular dysplasia or significant stenosis. Single renal arteries to both kidneys. IMA: Patent without evidence of aneurysm, dissection, vasculitis or significant stenosis. Inflow: Patent without evidence of aneurysm, dissection, vasculitis or significant stenosis. Veins: No obvious venous abnormality within the limitations of this arterial phase study. Review of the MIP images confirms the above findings. NON-VASCULAR Hepatobiliary: No focal liver abnormality is seen. Status post cholecystectomy. No biliary dilatation. Pancreas: Unremarkable. No pancreatic ductal dilatation or surrounding inflammatory changes. Spleen: Normal in size without focal abnormality. Adrenals/Urinary Tract: Adrenal glands are unremarkable. Kidneys are normal, without renal calculi, focal lesion, or hydronephrosis. Bladder is contracted in appearance and unremarkable. Stomach/Bowel: Contracted stomach. Normal small bowel rotation without small bowel  dilatation or inflammation. No bowel herniation. Status post appendectomy. Lymphatic: Stable nodularity noted within the left mesenteric fat, the largest measuring 2.4 x 1.6 cm versus 2.2 x 1.5 cm previously, unchanged appearance and may reflect mild reactive adenopathy. Reproductive: Uterus and bilateral adnexa are unremarkable. Other: Postsurgical change overlying ventral mesh repair. No recurrent hernia noted. Small fat containing umbilical hernia. Musculoskeletal: No acute or significant osseous findings. Review of the MIP images confirms the above findings. IMPRESSION: 1. No aneurysm of the aorta nor definite dissection. There are cardiac motion artifacts along the ascending aorta and main pulmonary artery which can limit assessment. If clinical suspicion remains high for aortic dissection, cardiac gated repeat imaging of chest with IV contrast may help. Findings however are believed due to artifact given its presence within both the ascending aorta and pulmonary artery. 2. Aberrant right subclavian artery coursing retroesophageal. 3. LAD coronary arteriosclerosis. 4. No acute vascular abnormality within the abdomen or pelvis. Evidence of aortic aneurysm or dissection within the abdomen pelvis. No significant branch vessel stenosis nor aneurysm. 5. Mesh repair ventral hernias without recurrent bowel herniation. 6. Stable nodularity within the mesenteric fat of the left hemiabdomen possibly representing chronic reactive adenopathy the largest  approximately 2.4 x 1.6 cm versus 2.2 x 1.5 cm previously. Electronically Signed   By: Ashley Royalty M.D.   On: 10/05/2016 02:52    Review of Systems  Constitutional: Positive for diaphoresis. Negative for chills and fever.  Eyes: Negative for double vision.  Respiratory: Positive for cough, sputum production and shortness of breath.   Cardiovascular: Positive for chest pain. Negative for palpitations, orthopnea and leg swelling.  Gastrointestinal: Positive for  nausea. Negative for abdominal pain and diarrhea.  Neurological: Positive for dizziness.   Blood pressure (!) 96/56, pulse 80, temperature 98.5 F (36.9 C), temperature source Oral, resp. rate 18, height 5' 8"  (1.727 m), weight 237 lb 14 oz (107.9 kg), last menstrual period 04/04/2015, SpO2 100 %. Physical Exam  Constitutional: She is oriented to person, place, and time.  HENT:  Head: Normocephalic and atraumatic.  Eyes: Conjunctivae are normal. Left eye exhibits no discharge. No scleral icterus.  Neck: Normal range of motion. Neck supple. No JVD present. No tracheal deviation present. No thyromegaly present.  Cardiovascular: Normal rate and regular rhythm.   Murmur (soft systolic murmur noted) heard. Respiratory: Effort normal and breath sounds normal. No respiratory distress. She has no wheezes. She has no rales. She exhibits no tenderness.  GI: Bowel sounds are normal. She exhibits no distension. There is no tenderness.  Musculoskeletal: She exhibits no edema, tenderness or deformity.  Neurological: She is alert and oriented to person, place, and time.    Assessment/Plan: Atypical chest pain in setting of cocaine abuse/abnormal EKG, rule out vasospasm.  Rule out MI. Acute bronchitis. Bipolar disorder. IBS. History of bronchial asthma. Tobacco abuse. Cocaine abuse. History of peptic ulcer disease. Plan Agree with present management. Consider adding calcium Blockers and nitrates as blood pressure tolerates Discussed with patient regarding various options of treatment, I.e., noninvasive stress testing versus left cardiac catheterization, possible PTCA stenting  His risk and benefits and agrees for nuclear stress test first. Discussed with patient regarding lifestyle changes, diet compliance with medication, etc.  Charolette Forward 10/05/2016, 5:02 PM

## 2016-10-05 NOTE — Progress Notes (Signed)
46 year old female with pmh  asthma/COPD, Bipolar  do, SBO, perforated ulcer; who presents with complaints of CP radiating to the back. Patient was noted to be initially hypotensive 73/52,and was responsive to IV fluids. Initial troponin negative, but EKG showing ST changes. CT angiogram of the chest was negative for any signs of dissection. WBC 13.3. UDS pending. Admission for observation for chest pain rule out.

## 2016-10-05 NOTE — ED Notes (Signed)
Pt states she is unable to take ASA. Pt refused medication.

## 2016-10-05 NOTE — ED Triage Notes (Signed)
C/o productive cough with yellow sputum, sob, and nasal congestion x 2 weeks.  History of asthma and allergies.  Reports sharp pain to center of chest, nausea, diaphoresis, and dizziness since 2:30 pm today.  Hypotensive at triage.

## 2016-10-05 NOTE — ED Notes (Signed)
Pt reports chest pain since 1430 on 10/04/16. Pt reports her chest hurts when she breaths and moves. Pt states the pain starts in the upper center of her chest and occasionally radiates around to her back.

## 2016-10-05 NOTE — ED Notes (Signed)
Patient transported to CT 

## 2016-10-06 ENCOUNTER — Observation Stay (HOSPITAL_COMMUNITY): Payer: Medicaid Other

## 2016-10-06 DIAGNOSIS — I959 Hypotension, unspecified: Secondary | ICD-10-CM | POA: Diagnosis not present

## 2016-10-06 DIAGNOSIS — F3160 Bipolar disorder, current episode mixed, unspecified: Secondary | ICD-10-CM | POA: Diagnosis not present

## 2016-10-06 DIAGNOSIS — D638 Anemia in other chronic diseases classified elsewhere: Secondary | ICD-10-CM

## 2016-10-06 DIAGNOSIS — R0789 Other chest pain: Secondary | ICD-10-CM | POA: Diagnosis not present

## 2016-10-06 DIAGNOSIS — R9431 Abnormal electrocardiogram [ECG] [EKG]: Secondary | ICD-10-CM | POA: Diagnosis not present

## 2016-10-06 LAB — CBC
HCT: 32.8 % — ABNORMAL LOW (ref 36.0–46.0)
Hemoglobin: 10.7 g/dL — ABNORMAL LOW (ref 12.0–15.0)
MCH: 28.8 pg (ref 26.0–34.0)
MCHC: 32.6 g/dL (ref 30.0–36.0)
MCV: 88.2 fL (ref 78.0–100.0)
PLATELETS: 374 10*3/uL (ref 150–400)
RBC: 3.72 MIL/uL — AB (ref 3.87–5.11)
RDW: 16.1 % — ABNORMAL HIGH (ref 11.5–15.5)
WBC: 8.2 10*3/uL (ref 4.0–10.5)

## 2016-10-06 LAB — BASIC METABOLIC PANEL
ANION GAP: 9 (ref 5–15)
BUN: 5 mg/dL — ABNORMAL LOW (ref 6–20)
CALCIUM: 9 mg/dL (ref 8.9–10.3)
CO2: 23 mmol/L (ref 22–32)
Chloride: 110 mmol/L (ref 101–111)
Creatinine, Ser: 0.91 mg/dL (ref 0.44–1.00)
GLUCOSE: 102 mg/dL — AB (ref 65–99)
Potassium: 3.4 mmol/L — ABNORMAL LOW (ref 3.5–5.1)
Sodium: 142 mmol/L (ref 135–145)

## 2016-10-06 LAB — HEPARIN LEVEL (UNFRACTIONATED): HEPARIN UNFRACTIONATED: 0.45 [IU]/mL (ref 0.30–0.70)

## 2016-10-06 LAB — HIV ANTIBODY (ROUTINE TESTING W REFLEX): HIV SCREEN 4TH GENERATION: NONREACTIVE

## 2016-10-06 MED ORDER — TECHNETIUM TC 99M TETROFOSMIN IV KIT
10.0000 | PACK | Freq: Once | INTRAVENOUS | Status: AC | PRN
  Administered 2016-10-06: 10 via INTRAVENOUS

## 2016-10-06 MED ORDER — REGADENOSON 0.4 MG/5ML IV SOLN
0.4000 mg | Freq: Once | INTRAVENOUS | Status: AC
  Administered 2016-10-06: 0.4 mg via INTRAVENOUS
  Filled 2016-10-06: qty 5

## 2016-10-06 MED ORDER — PANTOPRAZOLE SODIUM 40 MG PO TBEC: 40.0000 mg | DELAYED_RELEASE_TABLET | Freq: Every day | ORAL | 1 refills | Status: AC

## 2016-10-06 MED ORDER — PANTOPRAZOLE SODIUM 40 MG PO TBEC
40.0000 mg | DELAYED_RELEASE_TABLET | Freq: Two times a day (BID) | ORAL | Status: DC
  Administered 2016-10-06: 40 mg via ORAL
  Filled 2016-10-06: qty 1

## 2016-10-06 MED ORDER — GUAIFENESIN ER 600 MG PO TB12: 600.0000 mg | ORAL_TABLET | Freq: Two times a day (BID) | ORAL | 0 refills | Status: AC

## 2016-10-06 MED ORDER — AZITHROMYCIN 250 MG PO TABS: ORAL_TABLET | ORAL | 0 refills | Status: DC

## 2016-10-06 MED ORDER — REGADENOSON 0.4 MG/5ML IV SOLN
INTRAVENOUS | Status: AC
  Administered 2016-10-06: 0.4 mg via INTRAVENOUS
  Filled 2016-10-06: qty 5

## 2016-10-06 MED ORDER — TECHNETIUM TC 99M TETROFOSMIN IV KIT
30.0000 | PACK | Freq: Once | INTRAVENOUS | Status: AC | PRN
  Administered 2016-10-06: 30 via INTRAVENOUS

## 2016-10-06 NOTE — Progress Notes (Signed)
Subjective:  Complains of vague pleuritic localized chest pain. Patient ate breakfast earlier this morning nuclear stress test could not be done today  Objective:  Vital Signs in the last 24 hours: Temp:  [98.5 F (36.9 C)-98.6 F (37 C)] 98.6 F (37 C) (04/03 0605) Pulse Rate:  [71-80] 71 (04/03 0605) Resp:  [18] 18 (04/03 0605) BP: (96-107)/(56-71) 106/67 (04/03 0605) SpO2:  [100 %] 100 % (04/03 0605)  Intake/Output from previous day: 04/02 0701 - 04/03 0700 In: 396.8 [P.O.:240; I.V.:156.8] Out: -  Intake/Output from this shift: No intake/output data recorded.  Physical Exam: Exam unchanged  Lab Results:  Recent Labs  10/05/16 0013 10/06/16 0315  WBC 13.3* 8.2  HGB 12.8 10.7*  PLT 424* 374    Recent Labs  10/05/16 0013 10/05/16 0755 10/06/16 0315  NA 138  --  142  K 3.6  --  3.4*  CL 106  --  110  CO2 20*  --  23  GLUCOSE 105*  --  102*  BUN 12  --  5*  CREATININE 1.00 0.90 0.91    Recent Labs  10/05/16 0755 10/05/16 1317  TROPONINI <0.03  <0.03 <0.03   Hepatic Function Panel No results for input(s): PROT, ALBUMIN, AST, ALT, ALKPHOS, BILITOT, BILIDIR, IBILI in the last 72 hours. No results for input(s): CHOL in the last 72 hours. No results for input(s): PROTIME in the last 72 hours.  Imaging: Imaging results have been reviewed and Dg Chest 2 View  Result Date: 10/05/2016 CLINICAL DATA:  Chest pain today EXAM: CHEST  2 VIEW COMPARISON:  06/18/2015 FINDINGS: The lungs are clear. The pulmonary vasculature is normal. Heart size is normal. Hilar and mediastinal contours are unremarkable. There is no pleural effusion. IMPRESSION: No active cardiopulmonary disease. Electronically Signed   By: Ellery Plunk M.D.   On: 10/05/2016 00:40   Ct Angio Chest/abd/pel For Dissection W And/or Wo Contrast  Result Date: 10/05/2016 CLINICAL DATA:  Chest pain, history of pneumonia, COPD and renal insufficiency. History of cholecystectomy, appendectomy, hernia repair  with mesh, tubal ligation and gastric ulcer perforation. EXAM: CT ANGIOGRAPHY CHEST, ABDOMEN AND PELVIS TECHNIQUE: Multidetector CT imaging through the chest, abdomen and pelvis was performed using the standard protocol during bolus administration of intravenous contrast. Multiplanar reconstructed images and MIPs were obtained and reviewed to evaluate the vascular anatomy. CONTRAST:  100 cc Isovue 370 IV COMPARISON:  CT abdomen and pelvis 07/25/2016 FINDINGS: CTA CHEST FINDINGS Cardiovascular: Preferential opacification of the thoracic aorta. Cardiac pulsation artifacts in typical location of the ascending aorta and main pulmonary artery which can simulate ascending thoracic dissections but given artifacts also seen within the adjacent main pulmonary artery, this is believed on likely to represent an aortic dissection. If clinical suspicion remains high, cardiac gated repeat imaging of the chest with IV contrast may help for better confirmation. No evidence of thoracic aortic aneurysm. Normal heart size. No pericardial effusion. No large central pulmonary embolus. Coronary arteriosclerosis along the LAD with tiny focus of air noted within likely iatrogenic from contrast administration. 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 Mediastinum/Nodes: No enlarged mediastinal, hilar, or axillary lymph nodes. Thyroid gland, trachea, and esophagus demonstrate no significant findings. Lungs/Pleura: Lungs are clear. No pleural effusion or pneumothorax. Musculoskeletal: No chest wall abnormality. No acute or significant osseous findings. Review of the MIP images confirms the above findings. CTA ABDOMEN AND PELVIS FINDINGS VASCULAR Aorta: Normal caliber aorta without aneurysm, dissection, vasculitis or significant stenosis. Celiac: Patent without  evidence of aneurysm, dissection, vasculitis or significant stenosis. SMA: Patent without evidence of aneurysm, dissection, vasculitis or  significant stenosis. Renals: Both renal arteries are patent without evidence of aneurysm, dissection, vasculitis, fibromuscular dysplasia or significant stenosis. Single renal arteries to both kidneys. IMA: Patent without evidence of aneurysm, dissection, vasculitis or significant stenosis. Inflow: Patent without evidence of aneurysm, dissection, vasculitis or significant stenosis. Veins: No obvious venous abnormality within the limitations of this arterial phase study. Review of the MIP images confirms the above findings. NON-VASCULAR Hepatobiliary: No focal liver abnormality is seen. Status post cholecystectomy. No biliary dilatation. Pancreas: Unremarkable. No pancreatic ductal dilatation or surrounding inflammatory changes. Spleen: Normal in size without focal abnormality. Adrenals/Urinary Tract: Adrenal glands are unremarkable. Kidneys are normal, without renal calculi, focal lesion, or hydronephrosis. Bladder is contracted in appearance and unremarkable. Stomach/Bowel: Contracted stomach. Normal small bowel rotation without small bowel dilatation or inflammation. No bowel herniation. Status post appendectomy. Lymphatic: Stable nodularity noted within the left mesenteric fat, the largest measuring 2.4 x 1.6 cm versus 2.2 x 1.5 cm previously, unchanged appearance and may reflect mild reactive adenopathy. Reproductive: Uterus and bilateral adnexa are unremarkable. Other: Postsurgical change overlying ventral mesh repair. No recurrent hernia noted. Small fat containing umbilical hernia. Musculoskeletal: No acute or significant osseous findings. Review of the MIP images confirms the above findings. IMPRESSION: 1. No aneurysm of the aorta nor definite dissection. There are cardiac motion artifacts along the ascending aorta and main pulmonary artery which can limit assessment. If clinical suspicion remains high for aortic dissection, cardiac gated repeat imaging of chest with IV contrast may help. Findings however  are believed due to artifact given its presence within both the ascending aorta and pulmonary artery. 2. Aberrant right subclavian artery coursing retroesophageal. 3. LAD coronary arteriosclerosis. 4. No acute vascular abnormality within the abdomen or pelvis. Evidence of aortic aneurysm or dissection within the abdomen pelvis. No significant branch vessel stenosis nor aneurysm. 5. Mesh repair ventral hernias without recurrent bowel herniation. 6. Stable nodularity within the mesenteric fat of the left hemiabdomen possibly representing chronic reactive adenopathy the largest approximately 2.4 x 1.6 cm versus 2.2 x 1.5 cm previously. Electronically Signed   By: Tollie Eth M.D.   On: 10/05/2016 02:52    Cardiac Studies:  Assessment/Plan:  Atypical chest pain in setting of cocaine abuse/abnormal EKG, rule out vasospasm. [place] CTA Acute bronchitis. Bipolar disorder. IBS. History of bronchial asthma. Tobacco abuse. Cocaine abuse. History of peptic ulcer disease. Plan Nothing by mouth after midnight will reschedule for nuclear stress test in a.m.  LOS: 0 days    Rinaldo Cloud 10/06/2016, 10:15 AM

## 2016-10-06 NOTE — Progress Notes (Addendum)
PROGRESS NOTE  Anita Villarreal  ZOX:[id number] DOB: 1970/08/04 DOA: 10/05/2016 PCP: Teena Irani, PA-C Outpatient Specialists:  Subjective: Still complaining about 2-3/10 substernal chest pain, continue heparin. Per cardiology stress test cannot be done today because patient ate, scheduled for the morning.  Brief Narrative:  Anita Villarreal is a 46 y.o. female with medical history significant of COPD and asthma, PUD with h/o perforated ulcer, SBO, anemia bipolar d/o who presented to the ED with c/o chest pain. Patient reported an acucte onset of pain around 2 am this morning that was progressively worse and felt like something is ripping or tearing in her chest. Pain was associated with nausea, diaphoresis, dizziness. She also c/o cough productive of yellow sputum and SOB that have been present for near 2 weeks.  Assessment & Plan:   Principal Problem:   Chest pain Active Problems:   Bipolar I disorder, most recent episode mixed (HCC)   Anemia of chronic disease   Migraine headache   Abnormal ECG   Hypotension   Bronchitis   COPD with acute bronchitis (HCC)   Chest pain Presented with chest pain, admitted for ACS rule out, on heparin drip. Had some EKG changes but 3 sets of cardiac enzymes without ischemic findings. Cardiology evaluated the patient recommend a stress test, will be done in the morning.  Please note that patient has a prolonged history of GERD and even perforated ulcer, started on twice a day PPI.  Presumed acute bronchitis  Given patient's history of COPD, complaints of ongoing cough productive by mouth sputum during the last 2 weeks. She has low-grade temperature and elevated WBC of 13.3, started on antibiotics. We'll start on antibiotic, nebulizers as needed and guaifenesin  Anemia  of chronic disease - currently stable, continue to monitor  Bipolar disorder - continue Lithium and check its level, continue geodon  Migraine headache - currently  stable, asymptomatic On Imitrex and Phenergan at home, would refrain from giving Imitrex in light of possible CAD  GERD Past history of PUD and perforated ulcer, started on PPI twice a day.   DVT prophylaxis:  Code Status: Full Code Family Communication:  Disposition Plan:  Diet: Diet Heart Room service appropriate? Yes; Fluid consistency: Thin  Consultants:   Cardiology  Procedures:   None  Antimicrobials:   None   Objective: Vitals:   10/05/16 0658 10/05/16 1305 10/05/16 1946 10/06/16 0605  BP:  (!) 96/56 107/71 106/67  Pulse:  80 77 71  Resp:  Temp:  98.5 F (36.9 C) 98.5 F (36.9 C) 98.6 F (37 C)  TempSrc:  Oral Oral Oral  SpO2:  100% 100% 100%  Weight: 107.9 kg (237 lb 14 oz)     Height:  (1.727 m)       Intake/Output Summary (Last 24 hours) at 10/06/16 1135 Last data filed at 10/06/16 0800  Gross per 24 hour  Intake           156.75 ml  Output                0 ml  Net           156.75 ml   Filed Weights   10/05/16 0003 10/05/16 0658  Weight: 110.2 kg (243 lb) 107.9 kg (237 lb 14 oz)    Examination: General exam: Appears calm and comfortable  Respiratory system: Clear to auscultation. Respiratory effort normal. Cardiovascular system: S1 & S2 heard, RRR. No JVD, murmurs, rubs, gallops or clicks.  No pedal edema. Gastrointestinal system: Abdomen is nondistended, soft and nontender. No organomegaly or masses felt. Normal bowel sounds heard. Central nervous system: Alert and oriented. No focal neurological deficits. Extremities: Symmetric 5 x 5 power. Skin: No rashes, lesions or ulcers Psychiatry: Judgement and insight appear normal. Mood & affect appropriate.   Data Reviewed: I have personally reviewed following labs and imaging studies  CBC:  Recent Labs Lab 10/05/16 0013 10/06/16 0315  WBC 13.3* 8.2  HGB 12.8 10.7*  HCT 38.0 32.8*  MCV 87.8 88.2  PLT 424* 374   Basic Metabolic Panel:  Recent Labs Lab 10/05/16 0013  10/05/16 0755 10/06/16 0315  NA 138  --  142  K 3.6  --  3.4*  CL 106  --  110  CO2 20*  --  23  GLUCOSE 105*  --  102*  BUN 12  --  5*  CREATININE 1.00 0.90 0.91  CALCIUM 9.4  --  9.0   GFR: Estimated Creatinine Clearance: 98.3 mL/min (by C-G formula based on SCr of 0.91 mg/dL). Liver Function Tests: No results for input(s): AST, ALT, ALKPHOS, BILITOT, PROT, ALBUMIN in the last 168 hours. No results for input(s): LIPASE, AMYLASE in the last 168 hours. No results for input(s): AMMONIA in the last 168 hours. Coagulation Profile: No results for input(s): INR, PROTIME in the last 168 hours. Cardiac Enzymes:  Recent Labs Lab 10/05/16 0523 10/05/16 0755 10/05/16 1317  TROPONINI <0.03 <0.03  <0.03 <0.03   BNP (last 3 results) No results for input(s): PROBNP in the last 8760 hours. HbA1C: No results for input(s): HGBA1C in the last 72 hours. CBG: No results for input(s): GLUCAP in the last 168 hours. Lipid Profile: No results for input(s): CHOL, HDL, LDLCALC, TRIG, CHOLHDL, LDLDIRECT in the last 72 hours. Thyroid Function Tests: No results for input(s): TSH, T4TOTAL, FREET4, T3FREE, THYROIDAB in the last 72 hours. Anemia Panel: No results for input(s): VITAMINB12, FOLATE, FERRITIN, TIBC, IRON, RETICCTPCT in the last 72 hours. Urine analysis:    Component Value Date/Time   COLORURINE YELLOW 10/05/2016 1518   APPEARANCEUR CLEAR 10/05/2016 1518   LABSPEC 1.041 (H) 10/05/2016 1518   PHURINE 5.0 10/05/2016 1518   GLUCOSEU NEGATIVE 10/05/2016 1518   HGBUR SMALL (A) 10/05/2016 1518   BILIRUBINUR NEGATIVE 10/05/2016 1518   KETONESUR NEGATIVE 10/05/2016 1518   PROTEINUR NEGATIVE 10/05/2016 1518   NITRITE NEGATIVE 10/05/2016 1518   LEUKOCYTESUR NEGATIVE 10/05/2016 1518   Sepsis Labs: (procalcitonin:4,lacticidven:4)  )No results found for this or any previous visit (from the past 240 hour(s)).   Invalid input(s): PROCALCITONIN, LACTICACIDVEN   Radiology  Studies: Dg Chest 2 View  Result Date: 10/05/2016 CLINICAL DATA:  Chest pain today EXAM: CHEST  2 VIEW COMPARISON:  06/18/2015 FINDINGS: The lungs are clear. The pulmonary vasculature is normal. Heart size is normal. Hilar and mediastinal contours are unremarkable. There is no pleural effusion. IMPRESSION: No active cardiopulmonary disease. Electronically Signed   By: Ellery Plunk M.D.   On: 10/05/2016 00:40   Ct Angio Chest/abd/pel For Dissection W And/or Wo Contrast  Result Date: 10/05/2016 CLINICAL DATA:  Chest pain, history of pneumonia, COPD and renal insufficiency. History of cholecystectomy, appendectomy, hernia repair with mesh, tubal ligation and gastric ulcer perforation. EXAM: CT ANGIOGRAPHY CHEST, ABDOMEN AND PELVIS TECHNIQUE: Multidetector CT imaging through the chest, abdomen and pelvis was performed using the standard protocol during bolus administration of intravenous contrast. Multiplanar reconstructed images and MIPs were obtained and reviewed to evaluate the vascular anatomy. CONTRAST:  100 cc Isovue 370 IV COMPARISON:  CT abdomen and pelvis 07/25/2016 FINDINGS: CTA CHEST FINDINGS Cardiovascular: Preferential opacification of the thoracic aorta. Cardiac pulsation artifacts in typical location of the ascending aorta and main pulmonary artery which can simulate ascending thoracic dissections but given artifacts also seen within the adjacent main pulmonary artery, this is believed on likely to represent an aortic dissection. If clinical suspicion remains high, cardiac gated repeat imaging of the chest with IV contrast may help for better confirmation. No evidence of thoracic aortic aneurysm. Normal heart size. No pericardial effusion. No large central pulmonary embolus. Coronary arteriosclerosis along the LAD with tiny focus of air noted within likely iatrogenic from contrast administration. 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 Mediastinum/Nodes: No  enlarged mediastinal, hilar, or axillary lymph nodes. Thyroid gland, trachea, and esophagus demonstrate no significant findings. Lungs/Pleura: Lungs are clear. No pleural effusion or pneumothorax. Musculoskeletal: No chest wall abnormality. No acute or significant osseous findings. Review of the MIP images confirms the above findings. CTA ABDOMEN AND PELVIS FINDINGS VASCULAR Aorta: Normal caliber aorta without aneurysm, dissection, vasculitis or significant stenosis. Celiac: Patent without evidence of aneurysm, dissection, vasculitis or significant stenosis. SMA: Patent without evidence of aneurysm, dissection, vasculitis or significant stenosis. Renals: Both renal arteries are patent without evidence of aneurysm, dissection, vasculitis, fibromuscular dysplasia or significant stenosis. Single renal arteries to both kidneys. IMA: Patent without evidence of aneurysm, dissection, vasculitis or significant stenosis. Inflow: Patent without evidence of aneurysm, dissection, vasculitis or significant stenosis. Veins: No obvious venous abnormality within the limitations of this arterial phase study. Review of the MIP images confirms the above findings. NON-VASCULAR Hepatobiliary: No focal liver abnormality is seen. Status post cholecystectomy. No biliary dilatation. Pancreas: Unremarkable. No pancreatic ductal dilatation or surrounding inflammatory changes. Spleen: Normal in size without focal abnormality. Adrenals/Urinary Tract: Adrenal glands are unremarkable. Kidneys are normal, without renal calculi, focal lesion, or hydronephrosis. Bladder is contracted in appearance and unremarkable. Stomach/Bowel: Contracted stomach. Normal small bowel rotation without small bowel dilatation or inflammation. No bowel herniation. Status post appendectomy. Lymphatic: Stable nodularity noted within the left mesenteric fat, the largest measuring 2.4 x 1.6 cm versus 2.2 x 1.5 cm previously, unchanged appearance and may reflect mild  reactive adenopathy. Reproductive: Uterus and bilateral adnexa are unremarkable. Other: Postsurgical change overlying ventral mesh repair. No recurrent hernia noted. Small fat containing umbilical hernia. Musculoskeletal: No acute or significant osseous findings. Review of the MIP images confirms the above findings. IMPRESSION: 1. No aneurysm of the aorta nor definite dissection. There are cardiac motion artifacts along the ascending aorta and main pulmonary artery which can limit assessment. If clinical suspicion remains high for aortic dissection, cardiac gated repeat imaging of chest with IV contrast may help. Findings however are believed due to artifact given its presence within both the ascending aorta and pulmonary artery. 2. Aberrant right subclavian artery coursing retroesophageal. 3. LAD coronary arteriosclerosis. 4. No acute vascular abnormality within the abdomen or pelvis. Evidence of aortic aneurysm or dissection within the abdomen pelvis. No significant branch vessel stenosis nor aneurysm. 5. Mesh repair ventral hernias without recurrent bowel herniation. 6. Stable nodularity within the mesenteric fat of the left hemiabdomen possibly representing chronic reactive adenopathy the largest approximately 2.4 x 1.6 cm versus 2.2 x 1.5 cm previously. Electronically Signed   By: Tollie Eth M.D.   On: 10/05/2016 02:52        Scheduled Meds: . aspirin  324 mg Oral Once  . azithromycin  250  mg Oral Daily  . dicyclomine  20 mg Oral QID  . fluticasone furoate-vilanterol  1 puff Inhalation Daily  . guaiFENesin  600 mg Oral BID  . hydrOXYzine  200 mg Oral QHS  . lithium carbonate  600 mg Oral BID  . nicotine  14 mg Transdermal Daily  . nitroGLYCERIN  0.1 mg Transdermal Daily  . ziprasidone  160 mg Oral QHS  . zolpidem  5 mg Oral QHS   Continuous Infusions: . heparin 1,100 Units/hr (10/06/16 0951)     LOS: 0 days    Time spent: 35 minutes    Benyamin Jeff A, MD [facility] Pager [phone]  If 7PM-7AM, please contact night-coverage [email or web address] Password [email or web address] 10/06/2016, 11:35 AM

## 2016-10-06 NOTE — Discharge Summary (Signed)
Physician Discharge Summary  Anita Villarreal EAV:[id number] DOB: 1971/05/24 DOA: 10/05/2016  PCP: Teena Irani, PA-C  Admit date: 10/05/2016 Discharge date: 10/06/2016  Admitted From: Home Disposition: Home  Recommendations for Outpatient Follow-up:  1. Follow up with PCP in 1-2 weeks 2. Please obtain BMP/CBC in one week  Home Health: NA Equipment/Devices:NA  Discharge Condition: Stable CODE STATUS: Full Code Diet recommendation: Diet Heart Room service appropriate? Yes; Fluid consistency: Thin Diet - low sodium heart healthy  Brief/Interim Summary: Anita Leet Abernathyis a 46 y.o.femalewith medical history significant of COPD and asthma, PUD with h/o perforated ulcer, SBO, anemia bipolar d/o who presented to the ED with c/o chest pain. Patient reported an acucte onset of pain around 2 am this morning that was progressively worse and felt like something is ripping or tearing in her chest. Pain was associated with nausea, diaphoresis, dizziness. She also c/o cough productive of yellow sputum and SOB that have been present for near 2 weeks.  Discharge Diagnoses:  Principal Problem:   Chest pain Active Problems:   Bipolar I disorder, most recent episode mixed (HCC)   Anemia of chronic disease   Migraine headache   Abnormal ECG   Hypotension   Bronchitis   COPD with acute bronchitis (HCC)   Chest pain Presented with chest pain, admitted for ACS rule out, on heparin drip. CTA negative. Had some EKG changes but 3 sets of cardiac enzymes without ischemic findings. Please note that patient has a prolonged history of GERD and even perforated ulcer, started on Protonix Cardiology evaluated the patient, stress test done and showed low risk. She is discharged on Z-Pak, Mucinex and Protonix. Follow up with PCP.  Presumed acute bronchitis Given patient's history of COPD,complaints of ongoing cough productive by mouth sputum during the last 2 weeks. She has low-grade temperature  and elevated WBC of 13.3, started on antibiotics. Discharged on Zithromax and Mucinex.  Anemia of chronic disease - currently stable, continue to monitor  Bipolar disorder - continue Lithium and check its level, continue geodon  Migraine headache -currently stable, asymptomatic On Imitrex and Phenergan at home, would refrain from giving Imitrex in light of possible CAD  GERD Past history of PUD and perforated ulcer, DC on Protonix  Discharge Instructions  Discharge Instructions    Diet - low sodium heart healthy    Complete by:  As directed    Increase activity slowly    Complete by:  As directed      Allergies as of 10/06/2016      Reactions   Erythromycin Swelling, Other (See Comments)   Tongue swelling    Nsaids Other (See Comments)   BLEEDING (internally)   Sulfa Antibiotics Swelling, Other (See Comments)   Swelling of the tongue      Medication List    TAKE these medications   albuterol 108 (90 Base) MCG/ACT inhaler Commonly known as:  PROVENTIL HFA;VENTOLIN HFA Inhale 2 puffs into the lungs every 6 (six) hours as needed for wheezing or shortness of breath.   albuterol (2.5 MG/3ML) 0.083% nebulizer solution Commonly known as:  PROVENTIL Take 2.5 mg by nebulization every 6 (six) hours as needed for wheezing or shortness of breath.   azithromycin 250 MG tablet Commonly known as:  ZITHROMAX Take 2 tabs on day 1, then 1 tab PO daily Start taking on:  10/07/2016   budesonide-formoterol 80-4.5 MCG/ACT inhaler Commonly known as:  SYMBICORT Inhale 1 puff into the lungs daily.   dicyclomine 20 MG tablet Commonly known  as:  BENTYL Take 20 mg by mouth 4 (four) times daily.   guaiFENesin 600 MG 12 hr tablet Commonly known as:  MUCINEX Take 1 tablet (600 mg total) by mouth 2 (two) times daily.   hydrOXYzine 100 MG capsule Commonly known as:  VISTARIL Take 200 mg by mouth at bedtime.   lithium carbonate 300 MG capsule Take 600 mg by mouth 2 (two) times  daily.   nicotine 14 mg/24hr patch Commonly known as:  NICODERM CQ - dosed in mg/24 hours Place 14 mg onto the skin daily.   pantoprazole 40 MG tablet Commonly known as:  PROTONIX Take 1 tablet (40 mg total) by mouth daily.   polyethylene glycol packet Commonly known as:  MIRALAX / GLYCOLAX Take 17 g by mouth daily as needed. What changed:  additional instructions   promethazine 25 MG tablet Commonly known as:  PHENERGAN Take 25-50 mg by mouth See admin instructions. Along with each dose of Imitrex as needed for nausea accompanying migraines   SUMAtriptan 100 MG tablet Commonly known as:  IMITREX Take 100 mg by mouth See admin instructions. Once as needed for migraine (may repeat once in 2 hours if no relief)   ziprasidone 80 MG capsule Commonly known as:  GEODON Take 160 mg by mouth at bedtime.   zolpidem 10 MG tablet Commonly known as:  AMBIEN Take 10 mg by mouth at bedtime.      Follow-up Information    SPENCER,SARA C, PA-C Follow up in 1 week(s).   Specialty:  Physician Assistant Contact information: [address] [place] [address] [phone]          Allergies  Allergen Reactions  . Erythromycin Swelling and Other (See Comments)    Tongue swelling   . Nsaids Other (See Comments)    BLEEDING (internally)  . Sulfa Antibiotics Swelling and Other (See Comments)    Swelling of the tongue    Consultations:  Treatment Team:   Rinaldo Cloud, MD   Procedures Stress test, results below  Radiological studies: Dg Chest 2 View  Result Date: 10/05/2016 CLINICAL DATA:  Chest pain today EXAM: CHEST  2 VIEW COMPARISON:  06/18/2015 FINDINGS: The lungs are clear. The pulmonary vasculature is normal. Heart size is normal. Hilar and mediastinal contours are unremarkable. There is no pleural effusion. IMPRESSION: No active cardiopulmonary disease. Electronically Signed   By: Ellery Plunk M.D.   On: 10/05/2016 00:40   Nm Myocar Multi W/spect W/wall  Motion / Ef  Result Date: 10/06/2016 CLINICAL DATA:  46 year old with chest pain. EXAM: MYOCARDIAL IMAGING WITH SPECT (REST AND PHARMACOLOGIC-STRESS) GATED LEFT VENTRICULAR WALL MOTION STUDY LEFT VENTRICULAR EJECTION FRACTION TECHNIQUE: Standard myocardial SPECT imaging was performed after resting intravenous injection of 10 mCi Tc-76m tetrofosmin. Subsequently, intravenous infusion of Lexiscan was performed under the supervision of the Cardiology staff. At peak effect of the drug, 30 mCi Tc-21m tetrofosmin was injected intravenously and standard myocardial SPECT imaging was performed. Quantitative gated imaging was also performed to evaluate left ventricular wall motion, and estimate left ventricular ejection fraction. COMPARISON:  None. FINDINGS: Perfusion: No decreased activity in the left ventricle on stress imaging to suggest reversible ischemia or infarction. Wall Motion: Normal left ventricular wall motion. No left ventricular dilation. Left Ventricular Ejection Fraction: 57 % End diastolic volume 96 ml End systolic volume 41 ml IMPRESSION: 1. No reversible ischemia or infarction. 2. Normal left ventricular wall motion. 3. Left ventricular ejection fraction is 57%. 4. Non invasive risk stratification*: Low *2012  Appropriate Use Criteria for Coronary Revascularization Focused Update: J Am Coll Cardiol. 2012;59(9):857-881. http://content.[email or web address].aspx?articleid=[email or web address] Electronically Signed   By: Richarda Overlie M.D.   On: 10/06/2016 15:25   Ct Angio Chest/abd/pel For Dissection W And/or Wo Contrast  Result Date: 10/05/2016 CLINICAL DATA:  Chest pain, history of pneumonia, COPD and renal insufficiency. History of cholecystectomy, appendectomy, hernia repair with mesh, tubal ligation and gastric ulcer perforation. EXAM: CT ANGIOGRAPHY CHEST, ABDOMEN AND PELVIS TECHNIQUE: Multidetector CT imaging through the chest, abdomen and pelvis was performed using the standard protocol during bolus  administration of intravenous contrast. Multiplanar reconstructed images and MIPs were obtained and reviewed to evaluate the vascular anatomy. CONTRAST:  100 cc Isovue 370 IV COMPARISON:  CT abdomen and pelvis 07/25/2016 FINDINGS: CTA CHEST FINDINGS Cardiovascular: Preferential opacification of the thoracic aorta. Cardiac pulsation artifacts in typical location of the ascending aorta and main pulmonary artery which can simulate ascending thoracic dissections but given artifacts also seen within the adjacent main pulmonary artery, this is believed on likely to represent an aortic dissection. If clinical suspicion remains high, cardiac gated repeat imaging of the chest with IV contrast may help for better confirmation. No evidence of thoracic aortic aneurysm. Normal heart size. No pericardial effusion. No large central pulmonary embolus. Coronary arteriosclerosis along the LAD with tiny focus of air noted within likely iatrogenic from contrast administration. 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 Mediastinum/Nodes: No enlarged mediastinal, hilar, or axillary lymph nodes. Thyroid gland, trachea, and esophagus demonstrate no significant findings. Lungs/Pleura: Lungs are clear. No pleural effusion or pneumothorax. Musculoskeletal: No chest wall abnormality. No acute or significant osseous findings. Review of the MIP images confirms the above findings. CTA ABDOMEN AND PELVIS FINDINGS VASCULAR Aorta: Normal caliber aorta without aneurysm, dissection, vasculitis or significant stenosis. Celiac: Patent without evidence of aneurysm, dissection, vasculitis or significant stenosis. SMA: Patent without evidence of aneurysm, dissection, vasculitis or significant stenosis. Renals: Both renal arteries are patent without evidence of aneurysm, dissection, vasculitis, fibromuscular dysplasia or significant stenosis. Single renal arteries to both kidneys. IMA: Patent without evidence of  aneurysm, dissection, vasculitis or significant stenosis. Inflow: Patent without evidence of aneurysm, dissection, vasculitis or significant stenosis. Veins: No obvious venous abnormality within the limitations of this arterial phase study. Review of the MIP images confirms the above findings. NON-VASCULAR Hepatobiliary: No focal liver abnormality is seen. Status post cholecystectomy. No biliary dilatation. Pancreas: Unremarkable. No pancreatic ductal dilatation or surrounding inflammatory changes. Spleen: Normal in size without focal abnormality. Adrenals/Urinary Tract: Adrenal glands are unremarkable. Kidneys are normal, without renal calculi, focal lesion, or hydronephrosis. Bladder is contracted in appearance and unremarkable. Stomach/Bowel: Contracted stomach. Normal small bowel rotation without small bowel dilatation or inflammation. No bowel herniation. Status post appendectomy. Lymphatic: Stable nodularity noted within the left mesenteric fat, the largest measuring 2.4 x 1.6 cm versus 2.2 x 1.5 cm previously, unchanged appearance and may reflect mild reactive adenopathy. Reproductive: Uterus and bilateral adnexa are unremarkable. Other: Postsurgical change overlying ventral mesh repair. No recurrent hernia noted. Small fat containing umbilical hernia. Musculoskeletal: No acute or significant osseous findings. Review of the MIP images confirms the above findings. IMPRESSION: 1. No aneurysm of the aorta nor definite dissection. There are cardiac motion artifacts along the ascending aorta and main pulmonary artery which can limit assessment. If clinical suspicion remains high for aortic dissection, cardiac gated repeat imaging of chest with IV contrast may help. Findings however are believed due to artifact given its presence within both the ascending aorta  and pulmonary artery. 2. Aberrant right subclavian artery coursing retroesophageal. 3. LAD coronary arteriosclerosis. 4. No acute vascular abnormality  within the abdomen or pelvis. Evidence of aortic aneurysm or dissection within the abdomen pelvis. No significant branch vessel stenosis nor aneurysm. 5. Mesh repair ventral hernias without recurrent bowel herniation. 6. Stable nodularity within the mesenteric fat of the left hemiabdomen possibly representing chronic reactive adenopathy the largest approximately 2.4 x 1.6 cm versus 2.2 x 1.5 cm previously. Electronically Signed   By: Tollie Eth M.D.   On: 10/05/2016 02:52     Subjective:  Discharge Exam: Vitals:   10/06/16 1327 10/06/16 1329 10/06/16 1331 10/06/16 1332  BP: (!) 131/93 125/88 128/75   Pulse: (!) 102 (!) 102 95 100  Resp:      Temp:      TempSrc:      SpO2:      Weight:      Height:       General: Pt is alert, awake, not in acute distress Cardiovascular: RRR, S1/S2 +, no rubs, no gallops Respiratory: CTA bilaterally, no wheezing, no rhonchi Abdominal: Soft, NT, ND, bowel sounds + Extremities: no edema, no cyanosis   The results of significant diagnostics from this hospitalization (including imaging, microbiology, ancillary and laboratory) are listed below for reference.    Microbiology: No results found for this or any previous visit (from the past 240 hour(s)).   Labs: BNP (last 3 results) No results for input(s): BNP in the last 8760 hours. Basic Metabolic Panel:  Recent Labs Lab 10/05/16 0013 10/05/16 0755 10/06/16 0315  NA 138  --  142  K 3.6  --  3.4*  CL 106  --  110  CO2 20*  --  23  GLUCOSE 105*  --  102*  BUN 12  --  5*  CREATININE 1.00 0.90 0.91  CALCIUM 9.4  --  9.0   Liver Function Tests: No results for input(s): AST, ALT, ALKPHOS, BILITOT, PROT, ALBUMIN in the last 168 hours. No results for input(s): LIPASE, AMYLASE in the last 168 hours. No results for input(s): AMMONIA in the last 168 hours. CBC:  Recent Labs Lab 10/05/16 0013 10/06/16 0315  WBC 13.3* 8.2  HGB 12.8 10.7*  HCT 38.0 32.8*  MCV 87.8 88.2  PLT 424* 374    Cardiac Enzymes:  Recent Labs Lab 10/05/16 0523 10/05/16 0755 10/05/16 1317  TROPONINI <0.03 <0.03  <0.03 <0.03   BNP: Invalid input(s): POCBNP CBG: No results for input(s): GLUCAP in the last 168 hours. D-Dimer No results for input(s): DDIMER in the last 72 hours. Hgb A1c No results for input(s): HGBA1C in the last 72 hours. Lipid Profile No results for input(s): CHOL, HDL, LDLCALC, TRIG, CHOLHDL, LDLDIRECT in the last 72 hours. Thyroid function studies No results for input(s): TSH, T4TOTAL, T3FREE, THYROIDAB in the last 72 hours.  Invalid input(s): FREET3 Anemia work up No results for input(s): VITAMINB12, FOLATE, FERRITIN, TIBC, IRON, RETICCTPCT in the last 72 hours. Urinalysis    Component Value Date/Time   COLORURINE YELLOW 10/05/2016 1518   APPEARANCEUR CLEAR 10/05/2016 1518   LABSPEC 1.041 (H) 10/05/2016 1518   PHURINE 5.0 10/05/2016 1518   GLUCOSEU NEGATIVE 10/05/2016 1518   HGBUR SMALL (A) 10/05/2016 1518   BILIRUBINUR NEGATIVE 10/05/2016 1518   KETONESUR NEGATIVE 10/05/2016 1518   PROTEINUR NEGATIVE 10/05/2016 1518   NITRITE NEGATIVE 10/05/2016 1518   LEUKOCYTESUR NEGATIVE 10/05/2016 1518   Sepsis Labs Invalid input(s): PROCALCITONIN,  WBC,  LACTICIDVEN Microbiology No  results found for this or any previous visit (from the past 240 hour(s)).   Time coordinating discharge: Over 30 minutes  SIGNED:   Clint Lipps, MD  [facility] 10/06/2016, 5:59 PM Pager   If 7PM-7AM, please contact night-coverage [email or web address] Password [email or web address]

## 2016-10-06 NOTE — Progress Notes (Signed)
ANTICOAGULATION CONSULT NOTE - Follow Up Consult  Pharmacy Consult for Heparin Indication: chest pain/ACS  Allergies  Allergen Reactions  . Erythromycin Swelling and Other (See Comments)    Tongue swelling   . Nsaids Other (See Comments)    BLEEDING (internally)  . Sulfa Antibiotics Swelling and Other (See Comments)    Swelling of the tongue    Patient Measurements: Height:  (172.7 cm) Weight: 237 lb 14 oz (107.9 kg) IBW/kg (Calculated) : 63.9 Heparin Dosing Weight:  88.3 kg  Vital Signs: Temp: 98.6 F (37 C) (04/03 0605) Temp Source: Oral (04/03 0605) BP: 106/67 (04/03 0605) Pulse Rate: 71 (04/03 0605)  Labs:  Recent Labs  10/05/16 0013 10/05/16 0523 10/05/16 0755 10/05/16 1317 10/05/16 2032 10/06/16 0315  HGB 12.8  --   --   --   --  10.7*  HCT 38.0  --   --   --   --  32.8*  PLT 424*  --   --   --   --  374  HEPARINUNFRC  --   --   --   --  0.49 0.45  CREATININE 1.00  --  0.90  --   --  0.91  TROPONINI  --  <0.03 <0.03  <0.03 <0.03  --   --     Estimated Creatinine Clearance: 98.3 mL/min (by C-G formula based on SCr of 0.91 mg/dL).  Assessment:  Anticoag: none pta - iv hep for ACS r/o. HL 0.49, 0.45 remains in goal. Hgb 12.8>10.7. Plts 424>374.   Goal of Therapy:  Heparin level 0.3-0.7 units/ml Monitor platelets by anticoagulation protocol: Yes   Plan:  Heparin 1100 units/hr Daily HL, CBC  Lenore Moyano S. Merilynn Finland, PharmD, BCPS Clinical Staff Pharmacist Pager [phone]  Misty Stanley Stillinger 10/06/2016,9:36 AM

## 2016-10-21 ENCOUNTER — Encounter (HOSPITAL_COMMUNITY): Payer: Self-pay | Admitting: Surgery

## 2016-11-09 ENCOUNTER — Other Ambulatory Visit: Payer: Self-pay | Admitting: Physician Assistant

## 2016-11-09 DIAGNOSIS — Z1231 Encounter for screening mammogram for malignant neoplasm of breast: Secondary | ICD-10-CM

## 2017-01-14 NOTE — Addendum Note (Signed)
Addendum  created 01/14/17 1400 by Phillips Groutarignan, Lyndsi Altic, MD   Sign clinical note

## 2017-01-14 NOTE — Anesthesia Postprocedure Evaluation (Signed)
Anesthesia Post Note  Patient: Alison StallingMelanie R Breece  Procedure(s) Performed: Procedure(s) (LRB): HERNIA REPAIR INCISIONAL WITH TRANSVERSE ABDOMINUS RELEASE (N/A) INSERTION OF MESH (N/A)     Anesthesia Post Evaluation  Last Vitals:  Vitals:   08/17/16 0436 08/17/16 1343  BP: 100/63 117/63  Pulse: 81 86  Resp: 18 17  Temp: 37 C 36.9 C    Last Pain:  Vitals:   08/17/16 1739  TempSrc:   PainSc: 8                  Phillips Groutarignan, Christabella Alvira

## 2017-01-18 ENCOUNTER — Ambulatory Visit
Admission: RE | Admit: 2017-01-18 | Discharge: 2017-01-18 | Disposition: A | Discharge status: Home / Self Care | Payer: Medicaid Other | Source: Ambulatory Visit | Attending: Physician Assistant | Admitting: Physician Assistant

## 2017-01-18 DIAGNOSIS — Z1231 Encounter for screening mammogram for malignant neoplasm of breast: Secondary | ICD-10-CM

## 2019-01-29 ENCOUNTER — Emergency Department (HOSPITAL_COMMUNITY)
Admission: EM | Admit: 2019-01-29 | Discharge: 2019-01-29 | Disposition: A | Discharge status: Home / Self Care | Payer: Medicaid Other | Attending: Emergency Medicine | Admitting: Emergency Medicine

## 2019-01-29 ENCOUNTER — Emergency Department (HOSPITAL_COMMUNITY): Payer: Medicaid Other

## 2019-01-29 ENCOUNTER — Other Ambulatory Visit: Payer: Self-pay

## 2019-01-29 ENCOUNTER — Encounter (HOSPITAL_COMMUNITY): Payer: Self-pay | Admitting: Emergency Medicine

## 2019-01-29 DIAGNOSIS — Z79899 Other long term (current) drug therapy: Secondary | ICD-10-CM | POA: Insufficient documentation

## 2019-01-29 DIAGNOSIS — F17210 Nicotine dependence, cigarettes, uncomplicated: Secondary | ICD-10-CM | POA: Insufficient documentation

## 2019-01-29 DIAGNOSIS — J449 Chronic obstructive pulmonary disease, unspecified: Secondary | ICD-10-CM | POA: Diagnosis not present

## 2019-01-29 DIAGNOSIS — R0789 Other chest pain: Secondary | ICD-10-CM | POA: Insufficient documentation

## 2019-01-29 DIAGNOSIS — J45909 Unspecified asthma, uncomplicated: Secondary | ICD-10-CM | POA: Insufficient documentation

## 2019-01-29 LAB — BASIC METABOLIC PANEL
Anion gap: 10 (ref 5–15)
BUN: 13 mg/dL (ref 6–20)
CO2: 19 mmol/L — ABNORMAL LOW (ref 22–32)
Calcium: 9 mg/dL (ref 8.9–10.3)
Chloride: 108 mmol/L (ref 98–111)
Creatinine, Ser: 1.02 mg/dL — ABNORMAL HIGH (ref 0.44–1.00)
GFR calc Af Amer: >60 mL/min (ref >60)
GFR calc non Af Amer: >60 mL/min (ref >60)
Glucose, Bld: 110 mg/dL — ABNORMAL HIGH (ref 70–99)
Potassium: 3.1 mmol/L — ABNORMAL LOW (ref 3.5–5.1)
Sodium: 137 mmol/L (ref 135–145)

## 2019-01-29 LAB — CBC
HCT: 37.9 % (ref 36.0–46.0)
Hemoglobin: 12.9 g/dL (ref 12.0–15.0)
MCH: 30.9 pg (ref 26.0–34.0)
MCHC: 34 g/dL (ref 30.0–36.0)
MCV: 90.9 fL (ref 80.0–100.0)
Platelets: 361 10*3/uL (ref 150–400)
RBC: 4.17 MIL/uL (ref 3.87–5.11)
RDW: 15.8 % — ABNORMAL HIGH (ref 11.5–15.5)
WBC: 11.9 10*3/uL — ABNORMAL HIGH (ref 4.0–10.5)
nRBC: 0 % (ref 0.0–0.2)

## 2019-01-29 LAB — I-STAT BETA HCG BLOOD, ED (MC, WL, AP ONLY): I-stat hCG, quantitative: <5 m[IU]/mL (ref <5)

## 2019-01-29 LAB — TROPONIN I (HIGH SENSITIVITY)
Troponin I (High Sensitivity): <2 ng/L (ref <18)
Troponin I (High Sensitivity): <2 ng/L (ref <18)

## 2019-01-29 MED ORDER — CYCLOBENZAPRINE HCL 10 MG PO TABS: 10.0000 mg | ORAL_TABLET | Freq: Two times a day (BID) | ORAL | 0 refills | Status: DC | PRN

## 2019-01-29 MED ORDER — CYCLOBENZAPRINE HCL 10 MG PO TABS
5.0000 mg | ORAL_TABLET | Freq: Once | ORAL | Status: AC
  Administered 2019-01-29: 08:00:00 5 mg via ORAL
  Filled 2019-01-29: qty 1

## 2019-01-29 MED ORDER — ACETAMINOPHEN 325 MG PO TABS
650.0000 mg | ORAL_TABLET | Freq: Once | ORAL | Status: AC
  Administered 2019-01-29: 650 mg via ORAL
  Filled 2019-01-29: qty 2

## 2019-01-29 MED ORDER — SODIUM CHLORIDE 0.9% FLUSH: 3.0000 mL | Freq: Once | INTRAVENOUS | Status: DC

## 2019-01-29 NOTE — ED Provider Notes (Signed)
[facility] Provider Note   CSN: [id number] Arrival date & time: 01/29/19  0533     History   Chief Complaint Chief Complaint  Patient presents with  . Chest Pain    HPI Alison StallingMelanie R Kattner is a 48 y.o. female.     HPI  A 48 year old patient with a history of obesity presents for evaluation of chest pain. Initial onset of pain was approximately 1-3 hours ago. The patient's chest pain is well-localized, is described as heaviness/pressure/tightness, is sharp and is not worse with exertion. The patient's chest pain is middle- or left-sided and does not radiate to the arms/jaw/neck. The patient does not complain of nausea and denies diaphoresis. The patient has smoked in the past 90 days. The patient has no history of stroke, has no history of peripheral artery disease, denies any history of treated diabetes, has no relevant family history of coronary artery disease (first degree relative at less than age 50), is not hypertensive and has no history of hypercholesterolemia.   Past Medical History:  Diagnosis Date  . Abscess of abdominal wall 06/2015  . Anemia    hx  . Asthma   . Bipolar 1 disorder (HCC)   . COPD (chronic obstructive pulmonary disease) (HCC)    Hattie Perch/notes 08/13/2016  . Gastric ulcer with perforation (HCC) 06/2015  . Headache    hx migraines  . Heart murmur    ? young  . Manic depressive disorder (HCC)   . Partial small bowel obstruction (HCC)    multiple/notes 08/13/2016  . Pneumonia 2016  . Renal insufficiency     Patient Active Problem List   Diagnosis Date Noted  . Chest pain 10/05/2016  . Migraine headache 10/05/2016  . Abnormal ECG   . Hypotension   . Bronchitis   . COPD with acute bronchitis (HCC)   . Nausea & vomiting 07/31/2016  . Incisional hernia 07/25/2016  . Postoperative ileus (HCC) 06/27/2015  . Gastric leak 06/27/2015  . Anemia of chronic disease 06/27/2015  . Hypokalemia 06/17/2015  . HCAP  (healthcare-associated pneumonia) 06/17/2015  . Pneumonia 06/17/2015  . Intra-abdominal infection   . Abdominal abscess   . Bipolar I disorder, most recent episode mixed (HCC) 06/07/2015  . Acute gastric perforation 06/04/2015    Past Surgical History:  Procedure Laterality Date  . APPENDECTOMY    . CHOLECYSTECTOMY OPEN  05/2015  . EXPLORATORY LAPAROTOMY  06/04/15   Cheree DittoGraham patch repair of gastric ulcer  . HERNIA REPAIR    . INCISIONAL HERNIA REPAIR  08/13/2016   Open repair of complex incisional hernia with transversus abdominis release  And mesh/notes 2/8i/2018  . INCISIONAL HERNIA REPAIR N/A 08/13/2016   Procedure: HERNIA REPAIR INCISIONAL WITH TRANSVERSE ABDOMINUS RELEASE;  Surgeon: Harriette Bouillonhomas Cornett, MD;  [facility];  Service: General;  Laterality: N/A;  . INSERTION OF MESH N/A 08/13/2016   Procedure: INSERTION OF MESH;  Surgeon: Harriette Bouillonhomas Cornett, MD;  Location: [facility] OR;  Service: General;  Laterality: N/A;  . LAPAROTOMY N/A 06/04/2015   Procedure: EXPLORATORY LAPAROTOMY WITH REPAIROF GASTRIC ULCER AND BIOPSY OF GASTRIC ULCER;  Surgeon: Harriette Bouillonhomas Cornett, MD;  Location: [facility] OR;  Service: General;  Laterality: N/A;  . perforated bowel    . TUBAL LIGATION       OB History   No obstetric history on file.      Home Medications    Prior to Admission medications   Medication Sig Start Date End Date Taking? Authorizing Provider  albuterol (  PROVENTIL HFA;VENTOLIN HFA) 108 (90 BASE) MCG/ACT inhaler Inhale 2 puffs into the lungs every 6 (six) hours as needed for wheezing or shortness of breath.     [provider]  albuterol (PROVENTIL) (2.5 MG/3ML) 0.083% nebulizer solution Take 2.5 mg by nebulization every 6 (six) hours as needed for wheezing or shortness of breath.     [provider]  azithromycin (ZITHROMAX) 250 MG tablet Take 2 tabs on day 1, then 1 tab PO daily 10/07/16   Clydia LlanoElmahi, Mutaz, MD  budesonide-formoterol (SYMBICORT) 80-4.5 MCG/ACT inhaler Inhale 1 puff into the  lungs daily.     [provider]  dicyclomine (BENTYL) 20 MG tablet Take 20 mg by mouth 4 (four) times daily.    [provider]  guaiFENesin (MUCINEX) 600 MG 12 hr tablet Take 1 tablet (600 mg total) by mouth 2 (two) times daily. 10/06/16   Clydia LlanoElmahi, Mutaz, MD  hydrOXYzine (VISTARIL) 100 MG capsule Take 200 mg by mouth at bedtime.    [provider]  lithium carbonate 300 MG capsule Take 600 mg by mouth 2 (two) times daily.     [provider]  nicotine (NICODERM CQ - DOSED IN MG/24 HOURS) 14 mg/24hr patch Place 14 mg onto the skin daily.    [provider]  pantoprazole (PROTONIX) 40 MG tablet Take 1 tablet (40 mg total) by mouth daily. 10/06/16   Clydia LlanoElmahi, Mutaz, MD  polyethylene glycol (MIRALAX / GLYCOLAX) packet Take 17 g by mouth daily as needed. Patient taking differently: Take 17 g by mouth daily as needed. For constipation. 07/27/16   Adam PhenixSimaan, Elizabeth S, PA-C  promethazine (PHENERGAN) 25 MG tablet Take 25-50 mg by mouth See admin instructions. Along with each dose of Imitrex as needed for nausea accompanying migraines 04/07/13   [provider]  SUMAtriptan (IMITREX) 100 MG tablet Take 100 mg by mouth See admin instructions. Once as needed for migraine (may repeat once in 2 hours if no relief) 12/20/14   [provider]  ziprasidone (GEODON) 80 MG capsule Take 160 mg by mouth at bedtime.     [provider]  zolpidem (AMBIEN) 10 MG tablet Take 10 mg by mouth at bedtime.    [provider]    Family History Family History  Problem Relation Age of Onset  . Diabetes Mother   . CAD Mother   . Aneurysm Maternal Grandmother     Social History Social History   Tobacco Use  . Smoking status: Current Every Day Smoker    Packs/day: 0.50    Years: 31.00    Pack years: 15.50    Types: Cigarettes  . Smokeless tobacco: Never Used  . Tobacco comment: has patch on now->2 months  Substance Use Topics  . Alcohol use: Yes     Alcohol/week: 4.0 standard drinks    Types: 4 Cans of beer per week  . Drug use: No     Allergies   Erythromycin, Nsaids, and Sulfa antibiotics   Review of Systems Review of Systems  All other systems reviewed and are negative.    Physical Exam Updated Vital Signs BP 125/79   Pulse 74   Temp 98.4 F (36.9 C) (Oral)   Resp 19   Ht 1.727 m (5\' 8" )   Wt 106.1 kg   LMP 04/04/2015 Comment: neg hcg- tubes tied  SpO2 99%   BMI 35.58 kg/m   Physical Exam Vitals signs and nursing note reviewed.  Constitutional:      Appearance:  She is well-developed. She is obese.  HENT:     Head: Normocephalic and atraumatic.     Right Ear: External ear normal.     Left Ear: External ear normal.     Nose: Nose normal.  Eyes:     Conjunctiva/sclera: Conjunctivae normal.     Pupils: Pupils are equal, round, and reactive to light.  Neck:     Musculoskeletal: Normal range of motion and neck supple.  Cardiovascular:     Rate and Rhythm: Normal rate and regular rhythm.     Heart sounds: Normal heart sounds.  Pulmonary:     Effort: Pulmonary effort is normal.     Breath sounds: Normal breath sounds.  Chest:    Abdominal:     General: Bowel sounds are normal.     Palpations: Abdomen is soft.  Musculoskeletal: Normal range of motion.  Skin:    General: Skin is warm and dry.  Neurological:     Mental Status: She is alert and oriented to person, place, and time.     Deep Tendon Reflexes: Reflexes are normal and symmetric.  Psychiatric:        Behavior: Behavior normal.        Thought Content: Thought content normal.        Judgment: Judgment normal.      ED Treatments / Results  Labs (all labs ordered are listed, but only abnormal results are displayed) Labs Reviewed  BASIC METABOLIC PANEL - Abnormal; Notable for the following components:      Result Value   Potassium 3.1 (*)    CO2 19 (*)    Glucose, Bld 110 (*)    Creatinine, Ser 1.02 (*)    All other components  within normal limits  CBC - Abnormal; Notable for the following components:   WBC 11.9 (*)    RDW 15.8 (*)    All other components within normal limits  I-STAT BETA HCG BLOOD, ED ([facility], [facility], [facility] ONLY)  TROPONIN I (HIGH SENSITIVITY)  TROPONIN I (HIGH SENSITIVITY)    EKG EKG Interpretation  Date/Time:  Sunday January 29 2019 05:45:35 EDT Ventricular Rate:  71 PR Interval:  140 QRS Duration: 82 QT Interval:  444 QTC Calculation: 482 R Axis:   33 Text Interpretation:  Normal sinus rhythm T wave abnormality, consider inferior ischemia T wave abnormality, consider anterolateral ischemia Prolonged QT Abnormal ECG No significant change since last tracing Reconfirmed by Pattricia Boss [phone]) on 01/29/2019 8:01:20 AM   Radiology Dg Chest 2 View  Result Date: 01/29/2019 CLINICAL DATA:  48 year old female with history of chest pain. Several family contacts with COVID-19. EXAM: CHEST - 2 VIEW COMPARISON:  Chest x-Ezequiel Macauley 10/05/2016. FINDINGS: Mild diffuse interstitial prominence and peribronchial cuffing. Lung volumes are low. No consolidative airspace disease. No pleural effusions. No pneumothorax. No pulmonary nodule or mass noted. Pulmonary vasculature and the cardiomediastinal silhouette are within normal limits. IMPRESSION: 1. Mild diffuse interstitial prominence and peribronchial cuffing which may suggest an acute bronchitis. Electronically Signed   By: Vinnie Langton M.D.   On: 01/29/2019 06:22    Procedures Procedures (including critical care time)  Medications Ordered in ED Medications  sodium chloride flush (NS) 0.9 % injection 3 mL (has no administration in time range)     Initial Impression / Assessment and Plan / ED Course  I have reviewed the triage vital signs and the nursing notes.  Pertinent labs & imaging results that were available during my care of the patient were reviewed  by me and considered in my medical decision making (see chart for details).       Chest pain with  reproducibility and sharp- type of pain appears low risk.  Heart score 3, plan repeat troponin. EKG abnormal but stable from prior DDX- PE, chest wall, lung infection/infarction/pneumo/ other dz of lung parenchyma or intrathoraci space including great vessel. CXR- no evidence of above, perc negative Hemodynamically stable Plan smr and repeat trop Repeat troponin normal plan discharge Final Clinical Impressions(s) / ED Diagnoses   Final diagnoses:  Chest wall pain    ED Discharge Orders    None       Margarita Grizzleay, Ettie Krontz, MD 01/29/19 1005

## 2019-01-29 NOTE — ED Notes (Signed)
Pt. Complaining of worsening chest tightness.

## 2019-01-29 NOTE — ED Notes (Signed)
Pt. To desk again to complain of worsening chest pain and now short of breath.

## 2019-01-29 NOTE — ED Notes (Signed)
ED Provider at bedside. 

## 2019-01-29 NOTE — ED Triage Notes (Signed)
Pt reports generalized chest pain for the past 30 minutes.  Pt reports only symptom is weakness.  Reports several family members w/ positive COVID, she had a negative COVID test.  No COVID symptoms, denies SOB.

## 2019-04-13 ENCOUNTER — Other Ambulatory Visit: Payer: Self-pay | Admitting: Physician Assistant

## 2019-04-13 DIAGNOSIS — Z1231 Encounter for screening mammogram for malignant neoplasm of breast: Secondary | ICD-10-CM

## 2019-04-14 ENCOUNTER — Ambulatory Visit
Admission: RE | Admit: 2019-04-14 | Discharge: 2019-04-14 | Disposition: A | Discharge status: Home / Self Care | Payer: Medicaid Other | Source: Ambulatory Visit | Attending: Physician Assistant | Admitting: Physician Assistant

## 2019-04-14 ENCOUNTER — Other Ambulatory Visit: Payer: Self-pay

## 2019-04-14 DIAGNOSIS — Z1231 Encounter for screening mammogram for malignant neoplasm of breast: Secondary | ICD-10-CM

## 2019-07-03 ENCOUNTER — Encounter (HOSPITAL_COMMUNITY): Payer: Self-pay | Admitting: Emergency Medicine

## 2019-07-03 ENCOUNTER — Emergency Department (HOSPITAL_COMMUNITY)
Admission: EM | Admit: 2019-07-03 | Discharge: 2019-07-04 | Discharge status: Against Medical Advice | Payer: Medicaid Other | Attending: Emergency Medicine | Admitting: Emergency Medicine

## 2019-07-03 ENCOUNTER — Emergency Department (HOSPITAL_COMMUNITY): Payer: Medicaid Other

## 2019-07-03 ENCOUNTER — Other Ambulatory Visit: Payer: Self-pay

## 2019-07-03 DIAGNOSIS — Z5321 Procedure and treatment not carried out due to patient leaving prior to being seen by health care provider: Secondary | ICD-10-CM | POA: Diagnosis not present

## 2019-07-03 DIAGNOSIS — R0789 Other chest pain: Secondary | ICD-10-CM | POA: Diagnosis present

## 2019-07-03 LAB — CBC
HCT: 41.7 % (ref 36.0–46.0)
Hemoglobin: 14 g/dL (ref 12.0–15.0)
MCH: 30.6 pg (ref 26.0–34.0)
MCHC: 33.6 g/dL (ref 30.0–36.0)
MCV: 91.2 fL (ref 80.0–100.0)
Platelets: 444 10*3/uL — ABNORMAL HIGH (ref 150–400)
RBC: 4.57 MIL/uL (ref 3.87–5.11)
RDW: 15.7 % — ABNORMAL HIGH (ref 11.5–15.5)
WBC: 13.5 10*3/uL — ABNORMAL HIGH (ref 4.0–10.5)
nRBC: 0 % (ref 0.0–0.2)

## 2019-07-03 LAB — I-STAT BETA HCG BLOOD, ED (MC, WL, AP ONLY): I-stat hCG, quantitative: <5 m[IU]/mL (ref <5)

## 2019-07-03 MED ORDER — SODIUM CHLORIDE 0.9% FLUSH: 3.0000 mL | Freq: Once | INTRAVENOUS | Status: DC

## 2019-07-03 NOTE — ED Triage Notes (Signed)
Pt c/o 8/10 mid to left side cp for the past few days with sob, denies n/v, no change on taste of smell.

## 2019-07-04 LAB — BASIC METABOLIC PANEL
Anion gap: 9 (ref 5–15)
BUN: 7 mg/dL (ref 6–20)
CO2: 16 mmol/L — ABNORMAL LOW (ref 22–32)
Calcium: 9.2 mg/dL (ref 8.9–10.3)
Chloride: 113 mmol/L — ABNORMAL HIGH (ref 98–111)
Creatinine, Ser: 0.92 mg/dL (ref 0.44–1.00)
GFR calc Af Amer: >60 mL/min (ref >60)
GFR calc non Af Amer: >60 mL/min (ref >60)
Glucose, Bld: 106 mg/dL — ABNORMAL HIGH (ref 70–99)
Potassium: 3.8 mmol/L (ref 3.5–5.1)
Sodium: 138 mmol/L (ref 135–145)

## 2019-07-04 LAB — TROPONIN I (HIGH SENSITIVITY)
Troponin I (High Sensitivity): <2 ng/L (ref <18)
Troponin I (High Sensitivity): <2 ng/L (ref <18)

## 2019-07-04 NOTE — ED Notes (Signed)
Pt called multiple times by staff to reassess vitals and had no answer.

## 2019-10-28 ENCOUNTER — Other Ambulatory Visit: Payer: Self-pay

## 2019-10-28 ENCOUNTER — Encounter (HOSPITAL_COMMUNITY): Payer: Self-pay

## 2019-10-28 ENCOUNTER — Emergency Department (HOSPITAL_COMMUNITY)
Admission: EM | Admit: 2019-10-28 | Discharge: 2019-10-28 | Disposition: A | Discharge status: Home Health | Payer: Medicaid Other | Attending: Emergency Medicine | Admitting: Emergency Medicine

## 2019-10-28 DIAGNOSIS — J449 Chronic obstructive pulmonary disease, unspecified: Secondary | ICD-10-CM | POA: Diagnosis not present

## 2019-10-28 DIAGNOSIS — Y906 Blood alcohol level of 120-199 mg/100 ml: Secondary | ICD-10-CM | POA: Insufficient documentation

## 2019-10-28 DIAGNOSIS — F17210 Nicotine dependence, cigarettes, uncomplicated: Secondary | ICD-10-CM | POA: Diagnosis not present

## 2019-10-28 DIAGNOSIS — F319 Bipolar disorder, unspecified: Secondary | ICD-10-CM | POA: Diagnosis not present

## 2019-10-28 DIAGNOSIS — F10929 Alcohol use, unspecified with intoxication, unspecified: Secondary | ICD-10-CM | POA: Diagnosis not present

## 2019-10-28 DIAGNOSIS — F1910 Other psychoactive substance abuse, uncomplicated: Secondary | ICD-10-CM | POA: Diagnosis not present

## 2019-10-28 DIAGNOSIS — R400 Somnolence: Secondary | ICD-10-CM | POA: Diagnosis present

## 2019-10-28 DIAGNOSIS — Z79899 Other long term (current) drug therapy: Secondary | ICD-10-CM | POA: Insufficient documentation

## 2019-10-28 DIAGNOSIS — F10920 Alcohol use, unspecified with intoxication, uncomplicated: Secondary | ICD-10-CM

## 2019-10-28 LAB — CBC WITH DIFFERENTIAL/PLATELET
Abs Immature Granulocytes: 0.01 10*3/uL (ref 0.00–0.07)
Basophils Absolute: 0.1 10*3/uL (ref 0.0–0.1)
Basophils Relative: 1 %
Eosinophils Absolute: 0.1 10*3/uL (ref 0.0–0.5)
Eosinophils Relative: 2 %
HCT: 41.7 % (ref 36.0–46.0)
Hemoglobin: 14 g/dL (ref 12.0–15.0)
Immature Granulocytes: 0 %
Lymphocytes Relative: 51 %
Lymphs Abs: 4.2 10*3/uL — ABNORMAL HIGH (ref 0.7–4.0)
MCH: 32 pg (ref 26.0–34.0)
MCHC: 33.6 g/dL (ref 30.0–36.0)
MCV: 95.4 fL (ref 80.0–100.0)
Monocytes Absolute: 0.5 10*3/uL (ref 0.1–1.0)
Monocytes Relative: 6 %
Neutro Abs: 3.3 10*3/uL (ref 1.7–7.7)
Neutrophils Relative %: 40 %
Platelets: 452 10*3/uL — ABNORMAL HIGH (ref 150–400)
RBC: 4.37 MIL/uL (ref 3.87–5.11)
RDW: 18 % — ABNORMAL HIGH (ref 11.5–15.5)
WBC: 8.2 10*3/uL (ref 4.0–10.5)
nRBC: 0 % (ref 0.0–0.2)

## 2019-10-28 LAB — COMPREHENSIVE METABOLIC PANEL
ALT: 21 U/L (ref 0–44)
AST: 24 U/L (ref 15–41)
Albumin: 3.9 g/dL (ref 3.5–5.0)
Alkaline Phosphatase: 109 U/L (ref 38–126)
Anion gap: 10 (ref 5–15)
BUN: 10 mg/dL (ref 6–20)
CO2: 22 mmol/L (ref 22–32)
Calcium: 9 mg/dL (ref 8.9–10.3)
Chloride: 112 mmol/L — ABNORMAL HIGH (ref 98–111)
Creatinine, Ser: 0.89 mg/dL (ref 0.44–1.00)
GFR calc Af Amer: >60 mL/min (ref >60)
GFR calc non Af Amer: >60 mL/min (ref >60)
Glucose, Bld: 92 mg/dL (ref 70–99)
Potassium: 3 mmol/L — ABNORMAL LOW (ref 3.5–5.1)
Sodium: 144 mmol/L (ref 135–145)
Total Bilirubin: 0.5 mg/dL (ref 0.3–1.2)
Total Protein: 7.8 g/dL (ref 6.5–8.1)

## 2019-10-28 LAB — ACETAMINOPHEN LEVEL: Acetaminophen (Tylenol), Serum: <10 ug/mL — ABNORMAL LOW (ref 10–30)

## 2019-10-28 LAB — I-STAT CHEM 8, ED
BUN: 9 mg/dL (ref 6–20)
Calcium, Ion: 1.18 mmol/L (ref 1.15–1.40)
Chloride: 109 mmol/L (ref 98–111)
Creatinine, Ser: 1.1 mg/dL — ABNORMAL HIGH (ref 0.44–1.00)
Glucose, Bld: 90 mg/dL (ref 70–99)
HCT: 42 % (ref 36.0–46.0)
Hemoglobin: 14.3 g/dL (ref 12.0–15.0)
Potassium: 3.1 mmol/L — ABNORMAL LOW (ref 3.5–5.1)
Sodium: 145 mmol/L (ref 135–145)
TCO2: 21 mmol/L — ABNORMAL LOW (ref 22–32)

## 2019-10-28 LAB — LITHIUM LEVEL: Lithium Lvl: <0.06 mmol/L — ABNORMAL LOW (ref 0.60–1.20)

## 2019-10-28 LAB — RAPID URINE DRUG SCREEN, HOSP PERFORMED
Amphetamines: NOT DETECTED
Barbiturates: NOT DETECTED
Benzodiazepines: NOT DETECTED
Cocaine: POSITIVE — AB
Opiates: NOT DETECTED
Tetrahydrocannabinol: POSITIVE — AB

## 2019-10-28 LAB — ETHANOL: Alcohol, Ethyl (B): 189 mg/dL — ABNORMAL HIGH (ref <10)

## 2019-10-28 LAB — CBG MONITORING, ED: Glucose-Capillary: 93 mg/dL (ref 70–99)

## 2019-10-28 LAB — SALICYLATE LEVEL: Salicylate Lvl: <7 mg/dL — ABNORMAL LOW (ref 7.0–30.0)

## 2019-10-28 LAB — I-STAT BETA HCG BLOOD, ED (MC, WL, AP ONLY): I-stat hCG, quantitative: <5 m[IU]/mL (ref <5)

## 2019-10-28 MED ORDER — SODIUM CHLORIDE 0.9 % IV BOLUS
1000.0000 mL | Freq: Once | INTRAVENOUS | Status: AC
  Administered 2019-10-28: 1000 mL via INTRAVENOUS

## 2019-10-28 MED ORDER — SODIUM CHLORIDE 0.9 % IV SOLN: INTRAVENOUS | Status: DC

## 2019-10-28 NOTE — ED Notes (Signed)
Called Poison control respiratory depression, unsteady gate, seizures. Observation 6 hrs on cardiac monitor, lithium level, tylenol level, Bmp, ekg. Protect airway, fluids for hypotension, Benzos for agitation or seizures

## 2019-10-28 NOTE — ED Triage Notes (Signed)
Unknown amount of Ambien. Drank half fifth of tequila. Drank Nyquil. Smoked Marijuana.

## 2019-10-28 NOTE — ED Notes (Signed)
Opened chart to answer questions for Beth at [facility] regarding Tylenol level.

## 2019-10-28 NOTE — ED Provider Notes (Addendum)
[facility] DEPT Provider Note  CSN: [id number] Arrival date & time: 10/28/19 0148  Chief Complaint(s) Drug Overdose  HPI Anita Villarreal is a 49 y.o. female with a past medical history listed below who presents to the emergency department after being found down by a friend.  Friend believes the patient has taken her doses of Ambien, NyQuil in addition to heavy alcohol consumption and marijuana use.  Patient was brought in by EMS who reported somnolence but patient was able to be aroused.  She was hemodynamically stable in route.  Patient is somnolent but easily arousable.  Mumbles with speech.   Remainder of history, ROS, and physical exam limited due to patient's condition (AMS). Additional information was obtained from EMS.   Level V Caveat.    HPI  Past Medical History Past Medical History:  Diagnosis Date  . Abscess of abdominal wall 06/2015  . Anemia    hx  . Asthma   . Bipolar 1 disorder ([place])   . COPD (chronic obstructive pulmonary disease) ([place])    Archie Endo 08/13/2016  . Gastric ulcer with perforation ([place]) 06/2015  . Headache    hx migraines  . Heart murmur    ? young  . 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 Partial small bowel obstruction ([place])    multiple/notes 08/13/2016  . Pneumonia 2016  . Renal insufficiency    Patient Active Problem List   Diagnosis Date Noted  . Chest pain 10/05/2016  . Migraine headache 10/05/2016  . Abnormal ECG   . Hypotension   . Bronchitis   . COPD with acute bronchitis ([place])   . Nausea & vomiting 07/31/2016  . Incisional hernia 07/25/2016  . Postoperative ileus ([place]) 06/27/2015  . Gastric leak 06/27/2015  . Anemia of chronic disease 06/27/2015  . Hypokalemia 06/17/2015  . HCAP (healthcare-associated pneumonia) 06/17/2015  . Pneumonia 06/17/2015  . Intra-abdominal infection   . Abdominal abscess   . Bipolar I disorder, most recent episode mixed ([place]) 06/07/2015  . Acute gastric perforation  06/04/2015   Home Medication(s) Prior to Admission medications   Medication Sig Start Date End Date Taking? Authorizing Provider  dicyclomine (BENTYL) 20 MG tablet Take 20 mg by mouth 4 (four) times daily.   Yes [provider]  lamoTRIgine (LAMICTAL) 100 MG tablet Take 100 mg by mouth daily.  10/20/19  Yes [provider]  lithium carbonate 300 MG capsule Take 600 mg by mouth 2 (two) times daily.    Yes [provider]  topiramate (TOPAMAX) 50 MG tablet Take 50 mg by mouth 2 (two) times daily.  10/20/19  Yes [provider]  traZODone (DESYREL) 50 MG tablet Take 100 mg by mouth at bedtime.  10/20/19  Yes [provider]  zolpidem (AMBIEN) 10 MG tablet Take 10 mg by mouth at bedtime.   Yes [provider]  albuterol (PROVENTIL HFA;VENTOLIN HFA) 108 (90 BASE) MCG/ACT inhaler Inhale 2 puffs into the lungs every 6 (six) hours as needed for wheezing or shortness of breath.     [provider]  albuterol (PROVENTIL) (2.5 MG/3ML) 0.083% nebulizer solution Take 2.5 mg by nebulization every 6 (six) hours as needed for wheezing or shortness of breath.     [provider]  azithromycin (ZITHROMAX) 250 MG tablet Take 2 tabs on day 1, then 1 tab PO daily 10/07/16   Verlee Monte, MD  budesonide-formoterol (SYMBICORT) 80-4.5 MCG/ACT inhaler Inhale 1 puff into the lungs daily.  [provider]  cyclobenzaprine (FLEXERIL) 10 MG tablet Take 1 tablet (10 mg total) by mouth 2 (two) times daily as needed for muscle spasms. 01/29/19   Margarita Grizzleay, Danielle, MD  cyclobenzaprine (FLEXERIL) 10 MG tablet Take 1 tablet (10 mg total) by mouth 2 (two) times daily as needed for muscle spasms. 01/29/19   Margarita Grizzleay, Danielle, MD  guaiFENesin (MUCINEX) 600 MG 12 hr tablet Take 1 tablet (600 mg total) by mouth 2 (two) times daily. 10/06/16   Clydia LlanoElmahi, Mutaz, MD  hydrOXYzine (VISTARIL) 100 MG capsule Take 200 mg by mouth at bedtime.    [provider]  nicotine  (NICODERM CQ - DOSED IN MG/24 HOURS) 14 mg/24hr patch Place 14 mg onto the skin daily.    [provider]  pantoprazole (PROTONIX) 40 MG tablet Take 1 tablet (40 mg total) by mouth daily. 10/06/16   Clydia LlanoElmahi, Mutaz, MD  polyethylene glycol (MIRALAX / GLYCOLAX) packet Take 17 g by mouth daily as needed. Patient taking differently: Take 17 g by mouth daily as needed. For constipation. 07/27/16   Adam PhenixSimaan, Elizabeth S, PA-C  promethazine (PHENERGAN) 25 MG tablet Take 25-50 mg by mouth See admin instructions. Along with each dose of Imitrex as needed for nausea accompanying migraines 04/07/13   [provider]  SUMAtriptan (IMITREX) 100 MG tablet Take 100 mg by mouth See admin instructions. Once as needed for migraine (may repeat once in 2 hours if no relief) 12/20/14   [provider]  ziprasidone (GEODON) 80 MG capsule Take 160 mg by mouth at bedtime.     [provider]                                                                                                                                    Past Surgical History Past Surgical History:  Procedure Laterality Date  . APPENDECTOMY    . CHOLECYSTECTOMY OPEN  05/2015  . EXPLORATORY LAPAROTOMY  06/04/15   Cheree DittoGraham patch repair of gastric ulcer  . HERNIA REPAIR    . INCISIONAL HERNIA REPAIR  08/13/2016   Open repair of complex incisional hernia with transversus abdominis release  And mesh/notes 2/8i/2018  . INCISIONAL HERNIA REPAIR N/A 08/13/2016   Procedure: HERNIA REPAIR INCISIONAL WITH TRANSVERSE ABDOMINUS RELEASE;  Surgeon: Harriette Bouillonhomas Cornett, MD;  [facility];  Service: General;  Laterality: N/A;  . INSERTION OF MESH N/A 08/13/2016   Procedure: INSERTION OF MESH;  Surgeon: Harriette Bouillonhomas Cornett, MD;  Location: [facility] OR;  Service: General;  Laterality: N/A;  . LAPAROTOMY N/A 06/04/2015   Procedure: EXPLORATORY LAPAROTOMY WITH REPAIROF GASTRIC ULCER AND BIOPSY OF GASTRIC ULCER;  Surgeon: Harriette Bouillonhomas Cornett, MD;  Location: [facility] OR;   Service: General;  Laterality: N/A;  . perforated bowel    . TUBAL LIGATION     Family History Family History  Problem Relation Age of Onset  . Diabetes Mother   . CAD Mother   .  Aneurysm Maternal Grandmother   . Breast cancer Neg Hx     Social History Social History   Tobacco Use  . Smoking status: Current Every Day Smoker    Packs/day: 0.50    Years: 31.00    Pack years: 15.50    Types: Cigarettes  . Smokeless tobacco: Never Used  . Tobacco comment: has patch on now->2 months  Substance Use Topics  . Alcohol use: Yes    Alcohol/week: 4.0 standard drinks    Types: 4 Cans of beer per week  . Drug use: No   Allergies Erythromycin, Nsaids, and Sulfa antibiotics  Review of Systems Review of Systems  Unable to perform ROS: Mental status change    Physical Exam Vital Signs  I have reviewed the triage vital signs BP 94/64   Pulse 92   Temp (!) 97.4 F (36.3 C) (Oral)   Resp 18   Ht 5\' 9"  (1.753 m)   Wt 105 kg   LMP 04/04/2015 Comment: neg hcg- tubes tied  SpO2 99%   BMI 34.18 kg/m   Physical Exam Vitals reviewed.  Constitutional:      General: She is not in acute distress.    Appearance: She is well-developed. She is not diaphoretic.  HENT:     Head: Normocephalic and atraumatic.     Nose: Nose normal.  Eyes:     General: No scleral icterus.       Right eye: No discharge.        Left eye: No discharge.     Conjunctiva/sclera: Conjunctivae normal.     Pupils: Pupils are equal, round, and reactive to light.  Cardiovascular:     Rate and Rhythm: Normal rate and regular rhythm.     Heart sounds: No murmur. No friction rub. No gallop.   Pulmonary:     Effort: Pulmonary effort is normal. No respiratory distress.     Breath sounds: Normal breath sounds. No stridor. No rales.  Abdominal:     General: There is no distension.     Palpations: Abdomen is soft.     Tenderness: There is no abdominal tenderness.  Musculoskeletal:        General: No tenderness.      Cervical back: Normal range of motion and neck supple.  Skin:    General: Skin is warm and dry.     Findings: No erythema or rash.  Neurological:     Comments: Somnolent but arousable. Tracks with eyes. Moves all extremities.     ED Results and Treatments Labs (all labs ordered are listed, but only abnormal results are displayed) Labs Reviewed  COMPREHENSIVE METABOLIC PANEL - Abnormal; Notable for the following components:      Result Value   Potassium 3.0 (*)    Chloride 112 (*)    All other components within normal limits  SALICYLATE LEVEL - Abnormal; Notable for the following components:   Salicylate Lvl <7.0 (*)    All other components within normal limits  ACETAMINOPHEN LEVEL - Abnormal; Notable for the following components:   Acetaminophen (Tylenol), Serum <10 (*)    All other components within normal limits  ETHANOL - Abnormal; Notable for the following components:   Alcohol, Ethyl (B) 189 (*)    All other components within normal limits  RAPID URINE DRUG SCREEN, HOSP PERFORMED - Abnormal; Notable for the following components:   Cocaine POSITIVE (*)    Tetrahydrocannabinol POSITIVE (*)    All other components within normal limits  CBC  WITH DIFFERENTIAL/PLATELET - Abnormal; Notable for the following components:   RDW 18.0 (*)    Platelets 452 (*)    Lymphs Abs 4.2 (*)    All other components within normal limits  LITHIUM LEVEL - Abnormal; Notable for the following components:   Lithium Lvl <0.06 (*)    All other components within normal limits  I-STAT CHEM 8, ED - Abnormal; Notable for the following components:   Potassium 3.1 (*)    Creatinine, Ser 1.10 (*)    TCO2 21 (*)    All other components within normal limits  CBG MONITORING, ED  I-STAT BETA HCG BLOOD, ED ([facility], [facility], [facility] ONLY)                                                                                                                         EKG  EKG Interpretation  Date/Time: 10/28/2019  02:31      Ventricular Rate:   93 PR Interval:   156 QRS Duration:  94 QT Interval:   417 QTC Calculation:  519 Text Interpretation:  sinus rhythm. Left atrial enlargement. Nonspecific T wave changes. Prolonged QT.      Radiology No results found.  Pertinent labs & imaging results that were available during my care of the patient were reviewed by me and considered in my medical decision making (see chart for details).  Medications Ordered in ED Medications  sodium chloride 0.9 % bolus 1,000 mL (0 mLs Intravenous Stopped 10/28/19 0741)    And  0.9 %  sodium chloride infusion ( Intravenous New Bag/Given 10/28/19 0308)                                                                                                                                    Procedures Procedures  (including critical care time)  Medical Decision Making / ED Course I have reviewed the nursing notes for this encounter and the patient's prior records (if available in EHR or on provided paperwork).   KOHANA AMBLE was evaluated in Emergency Department on 10/28/2019 for the symptoms described in the history of present illness. She was evaluated in the context of the global COVID-19 pandemic, which necessitated consideration that the patient might be at risk for infection with the SARS-CoV-2 virus that causes COVID-19. Institutional protocols and algorithms that pertain to the evaluation of patients at risk for COVID-19 are in a state of  rapid change based on information released by regulatory bodies including the CDC and federal and state organizations. These policies and algorithms were followed during the patient's care in the ED.  Patient presents with somnolence in the setting of alcohol intoxication and ingestion of sedating medication. She is easily arousable and able to track to move all extremities.  No evidence of trauma requiring imaging at this time.  Coingestion labs obtained.  Patient is hemodynamically  stable. Will monitor and allow her to metabolize to freedom.  After 3 hours, patient more awake and alert.  Able to ambulate to the bathroom without complication.  Admits to drinking heavily, but reports that she did not take her Ambien or other nighttime medication.  She denied any other illicit drug use.  Labs otherwise reassuring.  UDS positive for cocaine and THC.  8:01 AM clinically sober. Stable ambulation and mentating well.       Final Clinical Impression(s) / ED Diagnoses Final diagnoses:  Polysubstance abuse (HCC)  Alcoholic intoxication without complication (HCC)    The patient appears reasonably screened and/or stabilized for discharge and I doubt any other medical condition or other [facility] requiring further screening, evaluation, or treatment in the ED at this time prior to discharge. Safe for discharge with strict return precautions.  Disposition: Discharge  Condition: Good  I have discussed the results, Dx and Tx plan with the patient/family who expressed understanding and agree(s) with the plan. Discharge instructions discussed at length. The patient/family was given strict return precautions who verbalized understanding of the instructions. No further questions at time of discharge.    ED Discharge Orders    None       Follow Up: Teena Irani, PA-C [address] [place] [address] [phone]   As needed     This chart was dictated using voice recognition software.  Despite best efforts to proofread,  errors can occur which can change the documentation meaning.     Nira Conn, MD 10/28/19 [phone]

## 2019-10-28 NOTE — ED Notes (Signed)
Pt had an episode of emesis over the side rail of the bed. Pt provided with an emesis bag and EVS was called to mop

## 2019-10-28 NOTE — ED Notes (Signed)
Pt ambulated to the restroom without assistance

## 2021-09-13 ENCOUNTER — Emergency Department (HOSPITAL_COMMUNITY)
Admission: EM | Admit: 2021-09-13 | Discharge: 2021-09-13 | Disposition: A | Discharge status: Home / Self Care | Payer: Medicaid Other | Attending: Emergency Medicine | Admitting: Emergency Medicine

## 2021-09-13 ENCOUNTER — Emergency Department (HOSPITAL_COMMUNITY): Payer: Medicaid Other

## 2021-09-13 ENCOUNTER — Encounter (HOSPITAL_COMMUNITY): Payer: Self-pay

## 2021-09-13 DIAGNOSIS — J449 Chronic obstructive pulmonary disease, unspecified: Secondary | ICD-10-CM | POA: Insufficient documentation

## 2021-09-13 DIAGNOSIS — S42254A Nondisplaced fracture of greater tuberosity of right humerus, initial encounter for closed fracture: Secondary | ICD-10-CM | POA: Insufficient documentation

## 2021-09-13 DIAGNOSIS — W109XXA Fall (on) (from) unspecified stairs and steps, initial encounter: Secondary | ICD-10-CM | POA: Insufficient documentation

## 2021-09-13 DIAGNOSIS — Z7951 Long term (current) use of inhaled steroids: Secondary | ICD-10-CM | POA: Insufficient documentation

## 2021-09-13 DIAGNOSIS — J45909 Unspecified asthma, uncomplicated: Secondary | ICD-10-CM | POA: Insufficient documentation

## 2021-09-13 DIAGNOSIS — Y93K1 Activity, walking an animal: Secondary | ICD-10-CM | POA: Insufficient documentation

## 2021-09-13 DIAGNOSIS — R52 Pain, unspecified: Secondary | ICD-10-CM

## 2021-09-13 MED ORDER — HYDROCODONE-ACETAMINOPHEN 5-325 MG PO TABS
1.0000 | ORAL_TABLET | Freq: Once | ORAL | Status: AC
  Administered 2021-09-13: 1 via ORAL
  Filled 2021-09-13: qty 1

## 2021-09-13 MED ORDER — OXYCODONE-ACETAMINOPHEN 5-325 MG PO TABS: 1.0000 | ORAL_TABLET | Freq: Four times a day (QID) | ORAL | 0 refills | Status: DC | PRN

## 2021-09-13 MED ORDER — FENTANYL CITRATE PF 50 MCG/ML IJ SOSY
25.0000 ug | PREFILLED_SYRINGE | Freq: Once | INTRAMUSCULAR | Status: AC
  Administered 2021-09-13: 25 ug via INTRAMUSCULAR
  Filled 2021-09-13: qty 1

## 2021-09-13 NOTE — ED Triage Notes (Signed)
Pt states that she missed a step when she was walking her dog this morning. R shoulder deformity, hx of dislocation Also having R knee pain  ?

## 2021-09-13 NOTE — ED Notes (Signed)
Patient transported to X-ray 

## 2021-09-13 NOTE — ED Provider Notes (Addendum)
[facility] EMERGENCY DEPARTMENT Provider Note   CSN: [id number] Arrival date & time: 09/13/21  8850     History  Chief Complaint  Patient presents with   Shoulder Injury    Anita Villarreal is a 51 y.o. female.  51 year old female presents today for evaluation of right shoulder pain following fall that occurred this morning around 5:15 AM.  Patient states she was walking her dog and that she was stepping onto the sidewalk she missed stepped and fell on her outstretched right arm.  She denies head trauma or other injury.  She states she was able to get up and she noticed that her right arm did not feel right.  She states she has had injuries to her right shoulder in the past including dislocations.  Most recently several years ago.  All traumatic.  Denies alcohol or substance use leading to her fall.  Patient is right-hand dominant.  The history is provided by the patient. No language interpreter was used.      Home Medications Prior to Admission medications   Medication Sig Start Date End Date Taking? Authorizing Provider  albuterol (PROVENTIL HFA;VENTOLIN HFA) 108 (90 BASE) MCG/ACT inhaler Inhale 2 puffs into the lungs every 6 (six) hours as needed for wheezing or shortness of breath.     [provider]  albuterol (PROVENTIL) (2.5 MG/3ML) 0.083% nebulizer solution Take 2.5 mg by nebulization every 6 (six) hours as needed for wheezing or shortness of breath.     [provider]  azithromycin (ZITHROMAX) 250 MG tablet Take 2 tabs on day 1, then 1 tab PO daily 10/07/16   Clydia Llano, MD  budesonide-formoterol (SYMBICORT) 80-4.5 MCG/ACT inhaler Inhale 1 puff into the lungs daily.     [provider]  cyclobenzaprine (FLEXERIL) 10 MG tablet Take 1 tablet (10 mg total) by mouth 2 (two) times daily as needed for muscle spasms. 01/29/19   Margarita Grizzle, MD  cyclobenzaprine (FLEXERIL) 10 MG tablet Take 1 tablet (10 mg total) by mouth 2 (two)  times daily as needed for muscle spasms. 01/29/19   Margarita Grizzle, MD  dicyclomine (BENTYL) 20 MG tablet Take 20 mg by mouth 4 (four) times daily.    [provider]  guaiFENesin (MUCINEX) 600 MG 12 hr tablet Take 1 tablet (600 mg total) by mouth 2 (two) times daily. 10/06/16   Clydia Llano, MD  hydrOXYzine (VISTARIL) 100 MG capsule Take 200 mg by mouth at bedtime.    [provider]  lamoTRIgine (LAMICTAL) 100 MG tablet Take 100 mg by mouth daily.  10/20/19   [provider]  lithium carbonate 300 MG capsule Take 600 mg by mouth 2 (two) times daily.     [provider]  nicotine (NICODERM CQ - DOSED IN MG/24 HOURS) 14 mg/24hr patch Place 14 mg onto the skin daily.    [provider]  pantoprazole (PROTONIX) 40 MG tablet Take 1 tablet (40 mg total) by mouth daily. 10/06/16   Clydia Llano, MD  polyethylene glycol (MIRALAX / GLYCOLAX) packet Take 17 g by mouth daily as needed. Patient taking differently: Take 17 g by mouth daily as needed. For constipation. 07/27/16   Adam Phenix, PA-C  promethazine (PHENERGAN) 25 MG tablet Take 25-50 mg by mouth See admin instructions. Along with each dose of Imitrex as needed for nausea accompanying migraines 04/07/13   [provider]  SUMAtriptan (IMITREX) 100 MG tablet Take 100 mg by mouth See admin instructions. Once as  needed for migraine (may repeat once in 2 hours if no relief) 12/20/14   [provider]  topiramate (TOPAMAX) 50 MG tablet Take 50 mg by mouth 2 (two) times daily.  10/20/19   [provider]  traZODone (DESYREL) 50 MG tablet Take 100 mg by mouth at bedtime.  10/20/19   [provider]  ziprasidone (GEODON) 80 MG capsule Take 160 mg by mouth at bedtime.     [provider]  zolpidem (AMBIEN) 10 MG tablet Take 10 mg by mouth at bedtime.    [provider]      Allergies    Erythromycin, Nsaids, and Sulfa antibiotics    Review of Systems   Review  of Systems  Musculoskeletal:  Positive for arthralgias. Negative for joint swelling, neck pain and neck stiffness.  All other systems reviewed and are negative.  Physical Exam Updated Vital Signs BP 132/75 (BP Location: Left Arm)    Pulse 83    Temp 98.2 F (36.8 C) (Oral)    Resp 20    LMP 04/04/2015 Comment: neg hcg- tubes tied   SpO2 99%  Physical Exam Vitals and nursing note reviewed.  Constitutional:      General: She is not in acute distress.    Appearance: Normal appearance. She is not ill-appearing.  HENT:     Head: Normocephalic and atraumatic.     Nose: Nose normal.  Eyes:     Conjunctiva/sclera: Conjunctivae normal.  Cardiovascular:     Rate and Rhythm: Normal rate and regular rhythm.     Pulses:          Radial pulses are 2+ on the right side and 2+ on the left side.  Pulmonary:     Effort: Pulmonary effort is normal. No respiratory distress.     Breath sounds: Normal breath sounds. No wheezing or rales.  Musculoskeletal:        General: No deformity.     Cervical back: Normal range of motion.     Comments: Right upper extremity without obvious deformity.  Right arm is resting in slight external rotation.  Significant tenderness to palpation present over right shoulder, humerus, elbow.  Limited range of motion in right shoulder, elbow secondary to pain.  Full range of motion in right wrist and all digits of right hand.  2+ radial pulse present.  Sensation intact including in the right upper lateral arm.  Contralateral side with 5/5 strength, full range of motion, 2+ radial pulse.  Cervical spine with full range of motion, and without tenderness to palpation.  Skin:    Findings: No rash.  Neurological:     Mental Status: She is alert.    ED Results / Procedures / Treatments   Labs (all labs ordered are listed, but only abnormal results are displayed) Labs Reviewed - No data to display  EKG None  Radiology No results found.  Procedures Procedures     Medications Ordered in ED Medications - No data to display  ED Course/ Medical Decision Making/ A&P                           Medical Decision Making Amount and/or Complexity of Data Reviewed Radiology: ordered.  Risk Prescription drug management.   Medical Decision Making / ED Course   This patient presents to the ED for concern of right shoulder pain, this involves an extensive number of treatment options, and is a complaint that carries with  it a high risk of complications and morbidity.  The differential diagnosis includes shoulder dislocation, fracture of the shoulder joint, fracture of the humerus, elbow fracture, muscle strain   MDM: 51 year old female presents today for evaluation of right shoulder pain.  Patient fell around 5:15 AM going up the sidewalk.  Patient fell on her outstretched arm.  Has history of trauma to her right shoulder including dislocations.  Exam without apparent deformity.  Limited range of motion secondary to pain.  Tender to palpation of her right shoulder, humerus, and right elbow.  Will obtain x-ray of right shoulder, right humerus, right elbow.  Will provide pain medication and reevaluate.  Neurovascularly intact. X-ray significant for proximal humerus fracture at the greater tuberosity.  This is a nondisplaced fracture.  Otherwise without dislocation or fracture at the shoulder joint, or elbow.  Patient provided sling for immobilization.  Pain medication for symptomatic management.  Orthopedic follow-up.  Return precautions discussed.  Patient voices understanding and is in agreement with plan.  Lab Tests: -I ordered, reviewed, and interpreted labs.   The pertinent results include:   Labs Reviewed - No data to display    EKG  EKG Interpretation  Date/Time:    Ventricular Rate:    PR Interval:    QRS Duration:   QT Interval:    QTC Calculation:   R Axis:     Text Interpretation:           Imaging Studies ordered: I ordered  imaging studies including right shoulder, right elbow, right humerus x-ray I independently visualized and interpreted imaging. I agree with the radiologist interpretation   Medicines ordered and prescription drug management: Meds ordered this encounter  Medications   HYDROcodone-acetaminophen (NORCO/VICODIN) 5-325 MG per tablet 1 tablet    -I have reviewed the patients home medicines and have made adjustments as needed  Reevaluation: After the interventions noted above, I reevaluated the patient and found that they have :improved  Co morbidities that complicate the patient evaluation  Past Medical History:  Diagnosis Date   Abscess of abdominal wall 06/2015   Anemia    hx   Asthma    Bipolar 1 disorder (HCC)    COPD (chronic obstructive pulmonary disease) (HCC)    Hattie Perch 08/13/2016   Gastric ulcer with perforation (HCC) 06/2015   Headache    hx migraines   Heart murmur    ? young   Manic depressive disorder (HCC)    Partial small bowel obstruction (HCC)    multiple/notes 08/13/2016   Pneumonia 2016   Renal insufficiency       Dispostion: Patient is appropriate for discharge.  Discharged in stable condition.  Return precautions discussed.  Patient given referral for orthopedics.  Patient states she wants to follow-up with orthopedic she saw in the past within the [facility] system.  I will provide her with our on-call orthopedic information if she is unable to get in with her preferred practice.   Final Clinical Impression(s) / ED Diagnoses Final diagnoses:  Closed nondisplaced fracture of greater tuberosity of right humerus, initial encounter    Rx / DC Orders ED Discharge Orders          Ordered    oxyCODONE-acetaminophen (PERCOCET/ROXICET) 5-325 MG tablet  Every 6 hours PRN        09/13/21 0919              Marita [place], PA-C 09/13/21 0919    Marita [place], PA-C 09/13/21 0919    Marianna Fuss  S, MD 09/14/21 [phone]

## 2021-09-13 NOTE — Discharge Instructions (Addendum)
Your x-ray showed you have a fracture of the humerus near the shoulder.  This is a nondisplaced fracture.  I have attached orthopedic information above for you to schedule a follow-up with if you are unable to get in with your preferred orthopedist at [facility] practice.  I have also sent in pain medication for you to use as needed for severe pain.  You can use Tylenol or ibuprofen for pain control.  You can also ice the area for about 15 to 20 minutes every 3-4 hours.  If you have worsening symptoms please return to the emergency room. ?

## 2022-07-09 ENCOUNTER — Other Ambulatory Visit: Payer: Self-pay | Admitting: Surgery

## 2022-07-09 DIAGNOSIS — M76891 Other specified enthesopathies of right lower limb, excluding foot: Secondary | ICD-10-CM

## 2022-07-27 ENCOUNTER — Ambulatory Visit
Admission: RE | Admit: 2022-07-27 | Discharge: 2022-07-27 | Disposition: A | Discharge status: Home / Self Care | Payer: Self-pay | Source: Ambulatory Visit | Attending: Surgery | Admitting: Surgery

## 2022-07-27 ENCOUNTER — Ambulatory Visit
Admission: RE | Admit: 2022-07-27 | Discharge: 2022-07-27 | Disposition: A | Discharge status: Home / Self Care | Payer: Medicaid Other | Source: Ambulatory Visit | Attending: Surgery | Admitting: Surgery

## 2022-07-27 DIAGNOSIS — M76891 Other specified enthesopathies of right lower limb, excluding foot: Secondary | ICD-10-CM

## 2022-07-27 MED ORDER — IOPAMIDOL (ISOVUE-M 200) INJECTION 41%
15.0000 mL | Freq: Once | INTRAMUSCULAR | Status: AC
  Administered 2022-07-27: 15 mL via INTRA_ARTICULAR

## 2022-11-25 ENCOUNTER — Encounter: Payer: Self-pay | Admitting: Allergy

## 2022-11-25 ENCOUNTER — Other Ambulatory Visit (HOSPITAL_COMMUNITY): Payer: Self-pay

## 2022-11-25 ENCOUNTER — Ambulatory Visit (INDEPENDENT_AMBULATORY_CARE_PROVIDER_SITE_OTHER): Payer: Medicaid Other | Admitting: Allergy

## 2022-11-25 VITALS — BP 100/70 | HR 81 | Temp 98.3°F | Resp 16 | Ht 69.0 in | Wt 200.7 lb

## 2022-11-25 DIAGNOSIS — J4540 Moderate persistent asthma, uncomplicated: Secondary | ICD-10-CM | POA: Diagnosis not present

## 2022-11-25 DIAGNOSIS — J310 Chronic rhinitis: Secondary | ICD-10-CM | POA: Diagnosis not present

## 2022-11-25 DIAGNOSIS — H1013 Acute atopic conjunctivitis, bilateral: Secondary | ICD-10-CM | POA: Diagnosis not present

## 2022-11-25 DIAGNOSIS — H109 Unspecified conjunctivitis: Secondary | ICD-10-CM

## 2022-11-25 MED ORDER — SPACER/AERO-HOLDING CHAMBERS DEVI
1 refills | Status: AC
Start: 2022-11-25 — End: ?

## 2022-11-25 MED ORDER — SYMBICORT 160-4.5 MCG/ACT IN AERO: 2.0000 | INHALATION_SPRAY | Freq: Two times a day (BID) | RESPIRATORY_TRACT | 2 refills | Status: DC

## 2022-11-25 MED ORDER — ALBUTEROL SULFATE HFA 108 (90 BASE) MCG/ACT IN AERS: 2.0000 | INHALATION_SPRAY | Freq: Four times a day (QID) | RESPIRATORY_TRACT | 1 refills | Status: DC | PRN

## 2022-11-25 MED ORDER — AZELASTINE-FLUTICASONE 137-50 MCG/ACT NA SUSP: NASAL | 2 refills | Status: DC

## 2022-11-25 MED ORDER — LEVOCETIRIZINE DIHYDROCHLORIDE 5 MG PO TABS: 5.0000 mg | ORAL_TABLET | Freq: Every evening | ORAL | 2 refills | Status: DC

## 2022-11-25 NOTE — Progress Notes (Signed)
New Patient Note  RE: Anita Villarreal MRN: [id number] DOB: 08-Jun-1971 Date of Office Visit: 11/25/2022  Primary care provider: Teena Irani, PA-C  Chief Complaint: allergies and asthma.   History of present illness: Anita Villarreal is a 52 y.o. female presenting today for evaluation of allergic rhinitis.    She reports symptoms of puffy eyes, itchy eyes, stuffy nose, throat clearing, coughing, hard to breathe. She is prone to sinus infections and states has had 2 thus far this year.  She usually gets antibiotics and prednisone for the sinus infection.  She states with sinus infections her asthma flares more and she does have sinus pressure through the nose and forehead.   She states she has had to take prednisone at times when her allergy symptoms are bad as well as using her nebulizer.  She states for her allergy symptoms she has been taking Zyrtec as needed year-round and sometimes she might need benadryl as zyrtec can help and sometimes it does not help. She has astelin that she uses as needed for runny or stuffy nose and sometimes helps.  She uses flonase as well as will switch out between flonase and the asteline.  She does have hydroxyzine for another issue (sleep) and did take last night.   She uses restasis for dry eye which she states helps with her eye symptoms.  She states if she has other eye issues she goes to her eye doctor and doesn't just use any type of eye drop without recommendations.  For her asthma she uses symbicort 2 puffs daily and does not have an spacer.   She states smells can be triggers for her asthma as well.  She states she is using the albuterol inhaler often in last couple months 3-4 times a week.  Albuterol for nebulizer lately more than twice a week.  The albuterol nebs makes her jittery thus she does not like to use this that much and will use at "last possible moment".  She has not required any hospitalization for asthma flare but does report ED  visits for treatment.  No history of eczema or food allergy.  She is down to 1 pack a week which she states is an improvement for her and is doing it without patches.  She is also trying to lose weight.    Review of systems: Review of Systems  Constitutional: Negative.   HENT:         See HPI  Eyes:        See HPI  Respiratory:         See HPI  Cardiovascular: Negative.   Gastrointestinal: Negative.   Musculoskeletal: Negative.   Skin: Negative.   Allergic/Immunologic: Negative.   Neurological: Negative.     All other systems negative unless noted above in HPI  Past medical history: Past Medical History:  Diagnosis Date   Abscess of abdominal wall 06/2015   Anemia    hx   Asthma    Bipolar 1 disorder (HCC)    COPD (chronic obstructive pulmonary disease) (HCC)    Hattie Perch 08/13/2016   Gastric ulcer with perforation (HCC) 06/2015   Headache    hx migraines   Heart murmur    ? young   Manic depressive disorder (HCC)    Partial small bowel obstruction (HCC)    multiple/notes 08/13/2016   Pneumonia 2016   Renal insufficiency     Past surgical history: Past Surgical History:  Procedure Laterality Date  APPENDECTOMY     CHOLECYSTECTOMY OPEN  05/2015   EXPLORATORY LAPAROTOMY  06/04/2015   Graham patch repair of gastric ulcer   HERNIA REPAIR     INCISIONAL HERNIA REPAIR  08/13/2016   Open repair of complex incisional hernia with transversus abdominis release  And mesh/notes 2/8i/2018   INCISIONAL HERNIA REPAIR N/A 08/13/2016   Procedure: HERNIA REPAIR INCISIONAL WITH TRANSVERSE ABDOMINUS RELEASE;  Surgeon: Harriette Bouillon, MD;  Location: [facility] OR;  Service: General;  Laterality: N/A;   INSERTION OF MESH N/A 08/13/2016   Procedure: INSERTION OF MESH;  Surgeon: Harriette Bouillon, MD;  Location: [facility] OR;  Service: General;  Laterality: N/A;   LAPAROTOMY N/A 06/04/2015   Procedure: EXPLORATORY LAPAROTOMY WITH REPAIROF GASTRIC ULCER AND BIOPSY OF GASTRIC ULCER;  Surgeon: Harriette Bouillon, MD;  Location: [facility] OR;  Service: General;  Laterality: N/A;   perforated bowel     TUBAL LIGATION      Family history:  Family History  Problem Relation Age of Onset   Diabetes Mother    CAD Mother    Aneurysm Maternal Grandmother    Eczema Child    Allergic rhinitis Child    Eczema Child    Eczema Child    Breast cancer Neg Hx    Asthma Neg Hx    Urticaria Neg Hx     Social history: Lives in a home with carpeting with electric heating and central cooling.  2 dogs in the home.  No concern for roaches in the home.  Concern for water damage, mildew in the home.  Reports smoking history Tobacco Use   Smoking status: Every Day    Packs/day: 0.50    Years: 31.00    Additional pack years: 0.00    Total pack years: 15.50    Types: Cigarettes   Smokeless tobacco: Never     Medication List: Current Outpatient Medications  Medication Sig Dispense Refill   albuterol (PROVENTIL HFA;VENTOLIN HFA) 108 (90 BASE) MCG/ACT inhaler Inhale 2 puffs into the lungs every 6 (six) hours as needed for wheezing or shortness of breath.      albuterol (PROVENTIL) (2.5 MG/3ML) 0.083% nebulizer solution Take 2.5 mg by nebulization every 6 (six) hours as needed for wheezing or shortness of breath.      albuterol (VENTOLIN HFA) 108 (90 Base) MCG/ACT inhaler Inhale 2 puffs into the lungs every 6 (six) hours as needed for wheezing or shortness of breath. 18 g 1   Azelastine-Fluticasone (DYMISTA) 137-50 MCG/ACT SUSP 1 spray each nostril 2 times daily as needed for runny nose/ drainage. 23 g 2   benzonatate (TESSALON) 100 MG capsule Take 100 mg by mouth 3 (three) times daily.     budesonide-formoterol (SYMBICORT) 160-4.5 MCG/ACT inhaler Inhale 2 puffs into the lungs 2 (two) times daily.     cetirizine (ZYRTEC) 10 MG tablet Take 10 mg by mouth daily.     cholecalciferol (VITAMIN D3) 25 MCG (1000 UNIT) tablet Take 1,000 Units by mouth daily.     cycloSPORINE (RESTASIS) 0.05 % ophthalmic emulsion Place 1  drop into both eyes 2 (two) times daily.     diazepam (VALIUM) 5 MG tablet Take 5 mg by mouth every 6 (six) hours as needed for anxiety.     dicyclomine (BENTYL) 20 MG tablet Take 20 mg by mouth 4 (four) times daily.     diphenhydrAMINE (BENADRYL) 25 mg capsule Take 25 mg by mouth every 6 (six) hours as needed for allergies.     guaiFENesin (  MUCINEX) 600 MG 12 hr tablet Take 1 tablet (600 mg total) by mouth 2 (two) times daily. 30 tablet 0   guaiFENesin-codeine 100-10 MG/5ML syrup Take 10 mLs by mouth every 4 (four) hours as needed for cough.     hydrOXYzine (ATARAX) 50 MG tablet Take 50 mg by mouth 3 (three) times daily as needed for anxiety.     hydrOXYzine (VISTARIL) 100 MG capsule Take 200 mg by mouth at bedtime.     LAGEVRIO 200 MG CAPS capsule Take 4 capsules by mouth 2 (two) times daily.     lamoTRIgine (LAMICTAL) 100 MG tablet Take 100 mg by mouth daily.      levocetirizine (XYZAL) 5 MG tablet Take 1 tablet (5 mg total) by mouth every evening. 30 tablet 2   lithium carbonate 300 MG capsule Take 600 mg by mouth 2 (two) times daily.      loperamide (IMODIUM) 2 MG capsule Take 2 mg by mouth as needed.     lurasidone (LATUDA) 40 MG TABS tablet Take 40 mg by mouth daily with breakfast.     metFORMIN (GLUCOPHAGE) 1000 MG tablet Take 1,000 mg by mouth 2 (two) times daily.     metoCLOPramide (REGLAN) 5 MG tablet Take 5 mg by mouth every 6 (six) hours as needed for refractory nausea / vomiting.     nicotine (NICODERM CQ - DOSED IN MG/24 HOURS) 14 mg/24hr patch Place 14 mg onto the skin daily.     omeprazole (PRILOSEC) 40 MG capsule Take 40 mg by mouth daily.     ondansetron (ZOFRAN-ODT) 4 MG disintegrating tablet Take 4 mg by mouth every 8 (eight) hours as needed for refractory nausea / vomiting.     oxyCODONE-acetaminophen (PERCOCET/ROXICET) 5-325 MG tablet Take 1 tablet by mouth every 6 (six) hours as needed for severe pain. 15 tablet 0   pantoprazole (PROTONIX) 40 MG tablet Take 1 tablet (40 mg  total) by mouth daily. 30 tablet 1   polyethylene glycol (MIRALAX / GLYCOLAX) packet Take 17 g by mouth daily as needed. (Patient taking differently: Take 17 g by mouth daily as needed. For constipation.) 14 each 0   prazosin (MINIPRESS) 5 MG capsule Take 5 mg by mouth at bedtime.     promethazine (PHENERGAN) 25 MG tablet Take 25-50 mg by mouth See admin instructions. Along with each dose of Imitrex as needed for nausea accompanying migraines     Spacer/Aero-Holding [facility] Use as directed with metered dose inhaler. 1 each 1   SUMAtriptan (IMITREX) 100 MG tablet Take 100 mg by mouth See admin instructions. Once as needed for migraine (may repeat once in 2 hours if no relief)     SYMBICORT 160-4.5 MCG/ACT inhaler Inhale 2 puffs into the lungs in the morning and at bedtime. 10.2 g 2   tiZANidine (ZANAFLEX) 2 MG tablet Take 2 mg by mouth every 8 (eight) hours as needed for muscle spasms.     topiramate (TOPAMAX) 50 MG tablet Take 50 mg by mouth 2 (two) times daily.      traZODone (DESYREL) 50 MG tablet Take 100 mg by mouth at bedtime.      ziprasidone (GEODON) 80 MG capsule Take 160 mg by mouth at bedtime.      zolpidem (AMBIEN) 10 MG tablet Take 10 mg by mouth at bedtime.     No current facility-administered medications for this visit.    Known medication allergies: Allergies  Allergen Reactions   Doxepin Itching   Erythromycin Swelling  and Other (See Comments)    Tongue swelling    Nsaids Other (See Comments)    BLEEDING (internally)   Pantoprazole Itching and Rash    Itchy eyes and throat   Sulfa Antibiotics Swelling, Other (See Comments) and Rash    Swelling of the tongue swelling     Physical examination: Blood pressure 100/70, pulse 81, temperature 98.3 F (36.8 C), temperature source Temporal, resp. rate 16, height 5\' 9"  (1.753 m), weight 200 lb 11.2 oz (91 kg), last menstrual period 04/04/2015, SpO2 99 %.  General: Alert, interactive, in no acute distress. HEENT:  PERRLA, TMs pearly gray, turbinates moderately edematous without discharge, post-pharynx non erythematous. Neck: Supple without lymphadenopathy. Lungs: Clear to auscultation without wheezing, rhonchi or rales. {no increased work of breathing. CV: Normal S1, S2 without murmurs. Abdomen: Nondistended, nontender. Skin: Warm and dry, without lesions or rashes. Extremities:  No clubbing, cyanosis or edema. Neuro:   Grossly intact.  Diagnositics/Labs:  Spirometry: FEV1: 2.25L 83%, FVC: 2.91L 86%, ratio consistent with nonobstructive pattern  Allergy testing: unable to perform due to recent antihistamine use  Assessment and plan: Allergic rhinitis with conjunctivitis Moderate persistent asthma, not well-controlled  - Will obtain environmental allergy testing and blood counts via bloodwork due to recent antihistamine use.   Will call you will these results.  - Stop taking: Flonase and Astelin as well as Zyrtec - Start taking: Xyzal (levocetirizine) 5mg  tablet once daily.  This replaces Zyrtec.   Dymista spray 1 spray each nostril twice a day as needed for runny or stuffy nose.  This spray has both Flonase and Astelin in the same spray bottle.  Point tip of bottle towards ear on same side eye for best technique.   - You can use an extra dose of the antihistamine, if needed, for breakthrough symptoms.  - Consider nasal saline rinses 1-2 times daily to remove allergens from the nasal cavities as well as help with mucous clearance (this is especially helpful to do before the nasal sprays are given)  - Spacer sample and demonstration provided. - Daily controller medication(s): Symbicort 160/4.2mcg two puffs twice daily with spacer. - Prior to physical activity: albuterol 2 puffs 10-15 minutes before physical activity. - Rescue medications: albuterol 2 puffs every 4-6 hours as needed  - Asthma control goals:  * Full participation in all desired activities (may need albuterol before activity) *  Albuterol use two time or less a week on average (not counting use with activity) * Cough interfering with sleep two time or less a month * Oral steroids no more than once a year * No hospitalizations  Follow-up in 3 months or sooner if needed  I appreciate the opportunity to take part in Anita Villarreal's care. Please do not hesitate to contact me with questions.  Sincerely,   Margo Aye, MD Allergy/Immunology [facility]

## 2022-11-25 NOTE — Patient Instructions (Addendum)
-   Will obtain environmental allergy testing and blood counts via bloodwork due to recent antihistamine use.   Will call you will these results.  - Stop taking: Flonase and Astelin as well as Zyrtec - Start taking: Xyzal (levocetirizine) 5mg  tablet once daily.  This replaces Zyrtec.   Dymista spray 1 spray each nostril twice a day as needed for runny or stuffy nose.  This spray has both Flonase and Astelin in the same spray bottle.  Point tip of bottle towards ear on same side eye for best technique.   - You can use an extra dose of the antihistamine, if needed, for breakthrough symptoms.  - Consider nasal saline rinses 1-2 times daily to remove allergens from the nasal cavities as well as help with mucous clearance (this is especially helpful to do before the nasal sprays are given)  - Spacer sample and demonstration provided. - Daily controller medication(s): Symbicort 160/4.65mcg two puffs twice daily with spacer. - Prior to physical activity: albuterol 2 puffs 10-15 minutes before physical activity. - Rescue medications: albuterol 2 puffs every 4-6 hours as needed  - Asthma control goals:  * Full participation in all desired activities (may need albuterol before activity) * Albuterol use two time or less a week on average (not counting use with activity) * Cough interfering with sleep two time or less a month * Oral steroids no more than once a year * No hospitalizations  Follow-up in 3 months or sooner if needed

## 2022-11-28 LAB — ALLERGENS W/TOTAL IGE AREA 2
Alternaria Alternata IgE: <0.1 kU/L
Aspergillus Fumigatus IgE: <0.1 kU/L
Bermuda Grass IgE: <0.1 kU/L
Cat Dander IgE: 0.1 kU/L — AB
Cedar, Mountain IgE: <0.1 kU/L
Cladosporium Herbarum IgE: <0.1 kU/L
Cockroach, German IgE: <0.1 kU/L
Common Silver Birch IgE: <0.1 kU/L
Cottonwood IgE: 0.16 kU/L — AB
D Farinae IgE: <0.1 kU/L
D Pteronyssinus IgE: <0.1 kU/L
Dog Dander IgE: 5.24 kU/L — AB
Elm, American IgE: 0.14 kU/L — AB
IgE (Immunoglobulin E), Serum: 143 IU/mL (ref 6–495)
Johnson Grass IgE: <0.1 kU/L
Maple/Box Elder IgE: 0.11 kU/L — AB
Mouse Urine IgE: <0.1 kU/L
Oak, White IgE: <0.1 kU/L
Pecan, Hickory IgE: 0.46 kU/L — AB
Penicillium Chrysogen IgE: <0.1 kU/L
Pigweed, Rough IgE: <0.1 kU/L
Ragweed, Short IgE: <0.1 kU/L
Sheep Sorrel IgE Qn: <0.1 kU/L
Timothy Grass IgE: <0.1 kU/L
White Mulberry IgE: <0.1 kU/L

## 2022-11-28 LAB — CBC WITH DIFFERENTIAL
Basophils Absolute: 0.1 10*3/uL (ref 0.0–0.2)
Basos: 1 %
EOS (ABSOLUTE): 0.1 10*3/uL (ref 0.0–0.4)
Eos: 1 %
Hematocrit: 36.9 % (ref 34.0–46.6)
Hemoglobin: 12.6 g/dL (ref 11.1–15.9)
Immature Grans (Abs): 0 10*3/uL (ref 0.0–0.1)
Immature Granulocytes: 0 %
Lymphocytes Absolute: 3.6 10*3/uL — ABNORMAL HIGH (ref 0.7–3.1)
Lymphs: 49 %
MCH: 33.2 pg — ABNORMAL HIGH (ref 26.6–33.0)
MCHC: 34.1 g/dL (ref 31.5–35.7)
MCV: 97 fL (ref 79–97)
Monocytes Absolute: 0.5 10*3/uL (ref 0.1–0.9)
Monocytes: 6 %
Neutrophils Absolute: 3.3 10*3/uL (ref 1.4–7.0)
Neutrophils: 43 %
RBC: 3.79 x10E6/uL (ref 3.77–5.28)
RDW: 14.6 % (ref 11.7–15.4)
WBC: 7.5 10*3/uL (ref 3.4–10.8)

## 2022-12-16 ENCOUNTER — Telehealth: Payer: Self-pay | Admitting: Allergy

## 2022-12-16 NOTE — Telephone Encounter (Signed)
Patient called back states she spoke to Jay (did not get last name) & was told to call office if she did not receive spacer. Patient states she did not receive spacer & that is why she called earlier to see if she could pick one up. Patient would like to pick a spacer up from the office.   Best contact number: [phone]  Spoke to Areta Haber to advise her patient never received spacer. Marcelino Duster did not speak to patient and there is no previous documentation with patient.

## 2022-12-16 NOTE — Telephone Encounter (Signed)
Patient states the spacer is $50.00 & she will not be able to pay that. Patient is wanting to know if she can pick a spacer up from the office instead.   Best contact number: [phone]

## 2022-12-17 NOTE — Telephone Encounter (Signed)
Patient came and picked up Spacer and a Nebulizer.

## 2022-12-17 NOTE — Telephone Encounter (Signed)
Called and spoke with patient and advised that a sample has been placed in [address] for pickup. Patient verbalized understanding and states that she will come by today or tomorrow to pick it up.

## 2023-01-14 NOTE — Telephone Encounter (Signed)
Patient called stating she is having a lot of itching all over her body. She states she is scratching so much it is driving her crazy. She said she gets worse when she gets hot. There isn't any rash present. She feels the Levocetrizine is not working. She states she really doesn't want to be on prednisone.   [facility] [employer].

## 2023-01-14 NOTE — Telephone Encounter (Signed)
Called and spoke with patient and she states that the itching has been going on for over a week now. She states that she gets this way when it gets really hot. She denies any rash, hives, or angioedema. She states that she has been going outside regularly especially to take her dogs out. She has tried frequent cold showers and calamine lotion. She is only taking Xyzal once daily. I did advise that you may want her to come in and be seen in office but that I would send you this message first. Patient verbalized understanding.

## 2023-01-15 ENCOUNTER — Other Ambulatory Visit: Payer: Self-pay | Admitting: Allergy

## 2023-01-15 NOTE — Telephone Encounter (Signed)
Called patient and let her know of the suggestions from Dr. Delorse Lek.

## 2023-02-16 ENCOUNTER — Telehealth: Payer: Self-pay

## 2023-02-16 NOTE — Telephone Encounter (Signed)
Patient called stating she received a bill for over $190 for her Nebulizer. Patient is wanting to know why did we use this company if it would be this much? Patient wants the nurse to contact [employer] and figure out her bill. I asked her if she checked to see if the company filed her insurance. Patient states they called her but she does not feel comfortable giving her information to them, as she has never used this company before. Patient feels its been 5 years since her last one, so it should be covered.

## 2023-02-17 ENCOUNTER — Other Ambulatory Visit: Payer: Self-pay | Admitting: Allergy

## 2023-02-18 NOTE — Telephone Encounter (Signed)
Called patient - DOB verified - explained process for receiving Neb Kit and spacer on 12/17/22.  Patient stated she NEVER received pink copies of Neb Doctor forms for neb kit and spacer. Patient stated the forms she signed were BLANK - she was asked to just sign - the CMA stated she would take care of the rest!  Patient advised copies of the neb kit and spacer forms will be given to her at her 3 mth f/u office visit: 02/26/23 @ 10:20 am w/Dr. Delorse Lek.  Patient stated she got the number of Neb Doctor from the invoice/bill she received - she was advised by one of the CSR to not worry about the bill ($190).  Patient advised I would contact our representative to research more about her bill - I will call her tomorrow, Friday,  02/19/23 or Monday, 02/22/23 to follow up once I have spoke to our rep.  Patient thanked me very much, verbalized understanding to all, no further questions.

## 2023-02-25 NOTE — Telephone Encounter (Addendum)
Called our [employer] rep - LMOVM again regarding below notation.  Our rep called back - DOB verified - stated she will send email to her billing dept to research - will call back once she has answers.  Called patient - DOB verified - advised of above notation.   Patient verbalized understanding, no further questions.

## 2023-02-26 ENCOUNTER — Encounter: Payer: Self-pay | Admitting: Allergy

## 2023-02-26 ENCOUNTER — Ambulatory Visit (INDEPENDENT_AMBULATORY_CARE_PROVIDER_SITE_OTHER): Payer: Medicaid Other | Admitting: Allergy

## 2023-02-26 ENCOUNTER — Other Ambulatory Visit: Payer: Self-pay

## 2023-02-26 VITALS — BP 120/78 | HR 73 | Temp 98.4°F | Ht 69.0 in | Wt 207.6 lb

## 2023-02-26 DIAGNOSIS — J302 Other seasonal allergic rhinitis: Secondary | ICD-10-CM | POA: Diagnosis not present

## 2023-02-26 DIAGNOSIS — H1013 Acute atopic conjunctivitis, bilateral: Secondary | ICD-10-CM | POA: Diagnosis not present

## 2023-02-26 DIAGNOSIS — J4540 Moderate persistent asthma, uncomplicated: Secondary | ICD-10-CM

## 2023-02-26 DIAGNOSIS — J3089 Other allergic rhinitis: Secondary | ICD-10-CM | POA: Diagnosis not present

## 2023-02-26 MED ORDER — AZELASTINE-FLUTICASONE 137-50 MCG/ACT NA SUSP: NASAL | 5 refills | Status: DC

## 2023-02-26 MED ORDER — LEVOCETIRIZINE DIHYDROCHLORIDE 5 MG PO TABS: 5.0000 mg | ORAL_TABLET | Freq: Every evening | ORAL | 5 refills | Status: DC

## 2023-02-26 MED ORDER — BUDESONIDE-FORMOTEROL FUMARATE 160-4.5 MCG/ACT IN AERO: 2.0000 | INHALATION_SPRAY | Freq: Two times a day (BID) | RESPIRATORY_TRACT | 5 refills | Status: DC

## 2023-02-26 MED ORDER — ALBUTEROL SULFATE HFA 108 (90 BASE) MCG/ACT IN AERS: 2.0000 | INHALATION_SPRAY | Freq: Four times a day (QID) | RESPIRATORY_TRACT | 1 refills | Status: DC | PRN

## 2023-02-26 MED ORDER — ALBUTEROL SULFATE (2.5 MG/3ML) 0.083% IN NEBU: 2.5000 mg | INHALATION_SOLUTION | RESPIRATORY_TRACT | 1 refills | Status: DC | PRN

## 2023-02-26 NOTE — Progress Notes (Signed)
Follow-up Note  RE: Anita Villarreal MRN: [id number] DOB: 1970-11-22 Date of Office Visit: 02/26/2023   History of present illness: Anita Villarreal is a 52 y.o. female presenting today for follow-up of allergic rhinitis with conjunctivitis and moderate persistent asthma.  She was last seen in the office on 11/25/2022 by myself.  She has not had any major health changes, surgeries or hospitalizations since that timeframe.  She states she has actually been doing quite well. She mops and sweeps her home everyday.  She does not let dog in the bedroom anymore. She does feel xyzal made a difference from zyrtec and was more effective.  The Dymista she states is better than having to use the 2 nose sprays and is effective.  She states this summer she has used her albuterol nebulizer due to heat.  She states with the heat she has used albuterol almost nightly.  She does feel the symbicort helps but not enough when the temp is high.  She tries to stay inside more when it is very hot outside.   She states she is cutting back with her smoking and is currently only smoking 2 packs a week.   She just started ajovy for her migraines and it is working great for her.    Review of systems: 10pt ROS negative unless noted above in HPI Past medical/social/surgical/family history have been reviewed and are unchanged unless specifically indicated below.  No changes  Medication List: Current Outpatient Medications  Medication Sig Dispense Refill   AJOVY 225 MG/1.5ML SOAJ Inject into the skin.     albuterol (PROVENTIL HFA;VENTOLIN HFA) 108 (90 BASE) MCG/ACT inhaler Inhale 2 puffs into the lungs every 6 (six) hours as needed for wheezing or shortness of breath.      albuterol (PROVENTIL) (2.5 MG/3ML) 0.083% nebulizer solution Take 2.5 mg by nebulization every 6 (six) hours as needed for wheezing or shortness of breath.      Azelastine-Fluticasone (DYMISTA) 137-50 MCG/ACT SUSP 1 spray each nostril 2 times  daily as needed for runny nose/ drainage. 23 g 2   benzonatate (TESSALON) 100 MG capsule Take 100 mg by mouth 3 (three) times daily.     budesonide-formoterol (SYMBICORT) 160-4.5 MCG/ACT inhaler Inhale 2 puffs into the lungs 2 (two) times daily.     cetirizine (ZYRTEC) 10 MG tablet Take 10 mg by mouth daily.     cholecalciferol (VITAMIN D3) 25 MCG (1000 UNIT) tablet Take 1,000 Units by mouth daily.     cycloSPORINE (RESTASIS) 0.05 % ophthalmic emulsion Place 1 drop into both eyes 2 (two) times daily.     diazepam (VALIUM) 5 MG tablet Take 5 mg by mouth every 6 (six) hours as needed for anxiety.     dicyclomine (BENTYL) 20 MG tablet Take 20 mg by mouth 4 (four) times daily.     diphenhydrAMINE (BENADRYL) 25 mg capsule Take 25 mg by mouth every 6 (six) hours as needed for allergies.     guaiFENesin (MUCINEX) 600 MG 12 hr tablet Take 1 tablet (600 mg total) by mouth 2 (two) times daily. 30 tablet 0   hydrOXYzine (ATARAX) 50 MG tablet Take 50 mg by mouth 3 (three) times daily as needed for anxiety.     hydrOXYzine (VISTARIL) 100 MG capsule Take 200 mg by mouth at bedtime.     LAGEVRIO 200 MG CAPS capsule Take 4 capsules by mouth 2 (two) times daily.     lamoTRIgine (LAMICTAL) 100 MG tablet Take 100 mg  by mouth daily.      levocetirizine (XYZAL) 5 MG tablet TAKE 1 TABLET(5 MG) BY MOUTH EVERY EVENING 30 tablet 2   lithium carbonate 300 MG capsule Take 600 mg by mouth 2 (two) times daily.      loperamide (IMODIUM) 2 MG capsule Take 2 mg by mouth as needed.     lurasidone (LATUDA) 40 MG TABS tablet Take 40 mg by mouth daily with breakfast.     metFORMIN (GLUCOPHAGE) 1000 MG tablet Take 1,000 mg by mouth 2 (two) times daily.     metoCLOPramide (REGLAN) 5 MG tablet Take 5 mg by mouth every 6 (six) hours as needed for refractory nausea / vomiting.     nicotine (NICODERM CQ - DOSED IN MG/24 HOURS) 14 mg/24hr patch Place 14 mg onto the skin daily.     omeprazole (PRILOSEC) 40 MG capsule Take 40 mg by mouth  daily.     ondansetron (ZOFRAN-ODT) 4 MG disintegrating tablet Take 4 mg by mouth every 8 (eight) hours as needed for refractory nausea / vomiting.     oxyCODONE-acetaminophen (PERCOCET/ROXICET) 5-325 MG tablet Take 1 tablet by mouth every 6 (six) hours as needed for severe pain. 15 tablet 0   pantoprazole (PROTONIX) 40 MG tablet Take 1 tablet (40 mg total) by mouth daily. 30 tablet 1   polyethylene glycol (MIRALAX / GLYCOLAX) packet Take 17 g by mouth daily as needed. (Patient taking differently: Take 17 g by mouth daily as needed. For constipation.) 14 each 0   prazosin (MINIPRESS) 5 MG capsule Take 5 mg by mouth at bedtime.     promethazine (PHENERGAN) 25 MG tablet Take 25-50 mg by mouth See admin instructions. Along with each dose of Imitrex as needed for nausea accompanying migraines     Spacer/Aero-[facility] Use as directed with metered dose inhaler. 1 each 1   SUMAtriptan (IMITREX) 100 MG tablet Take 100 mg by mouth See admin instructions. Once as needed for migraine (may repeat once in 2 hours if no relief)     SYMBICORT 160-4.5 MCG/ACT inhaler Inhale 2 puffs into the lungs in the morning and at bedtime. 10.2 g 2   tiZANidine (ZANAFLEX) 2 MG tablet Take 2 mg by mouth every 8 (eight) hours as needed for muscle spasms.     topiramate (TOPAMAX) 50 MG tablet Take 50 mg by mouth 2 (two) times daily.      traZODone (DESYREL) 50 MG tablet Take 100 mg by mouth at bedtime.      VENTOLIN HFA 108 (90 Base) MCG/ACT inhaler INHALE 2 PUFFS INTO THE LUNGS EVERY 6 HOURS AS NEEDED FOR WHEEZING OR SHORTNESS OF BREATH 18 g 1   ziprasidone (GEODON) 80 MG capsule Take 160 mg by mouth at bedtime.      zolpidem (AMBIEN) 10 MG tablet Take 10 mg by mouth at bedtime.     guaiFENesin-codeine 100-10 MG/5ML syrup Take 10 mLs by mouth every 4 (four) hours as needed for cough.     No current facility-administered medications for this visit.     Known medication allergies: Allergies  Allergen Reactions    Doxepin Itching   Erythromycin Swelling and Other (See Comments)    Tongue swelling    Nsaids Other (See Comments)    BLEEDING (internally)   Pantoprazole Itching and Rash    Itchy eyes and throat   Sulfa Antibiotics Swelling, Other (See Comments) and Rash    Swelling of the tongue swelling     Physical examination:  Blood pressure 120/78, pulse 73, temperature 98.4 F (36.9 C), height 5\' 9"  (1.753 m), weight 207 lb 9.6 oz (94.2 kg), last menstrual period 04/04/2015, SpO2 99%.  General: Alert, interactive, in no acute distress. HEENT: PERRLA, TMs pearly gray, turbinates minimally edematous without discharge, post-pharynx non erythematous. Neck: Supple without lymphadenopathy. Lungs: Clear to auscultation without wheezing, rhonchi or rales. {no increased work of breathing. CV: Normal S1, S2 without murmurs. Abdomen: Nondistended, nontender. Skin: Warm and dry, without lesions or rashes. Extremities:  No clubbing, cyanosis or edema. Neuro:   Grossly intact.  Diagnositics/Labs: Labs:  Component     Latest Ref Rng 11/25/2022  IgE (Immunoglobulin E), Serum     6 - 495 IU/mL 143   D Pteronyssinus IgE     Class 0 kU/L <0.10   D Farinae IgE     Class 0 kU/L <0.10   Cat Dander IgE     Class 0/I kU/L 0.10 !   Dog Dander IgE     Class IV kU/L 5.24 !   [place] Grass IgE     Class 0 kU/L <0.10   Timothy Grass IgE     Class 0 kU/L <0.10   [place] Grass IgE     Class 0 kU/L <0.10   Cockroach, German IgE     Class 0 kU/L <0.10   Penicillium Chrysogen IgE     Class 0 kU/L <0.10   Cladosporium Herbarum IgE     Class 0 kU/L <0.10   Aspergillus Fumigatus IgE     Class 0 kU/L <0.10   Alternaria Alternata IgE     Class 0 kU/L <0.10   Maple/Box Elder IgE     Class 0/I kU/L 0.11 !   Common Silver Charletta Cousin IgE     Class 0 kU/L <0.10   [place], [place] IgE     Class 0 kU/L <0.10   [place], [place] IgE     Class 0 kU/L <0.10   Elm, American IgE     Class 0/I kU/L 0.14 !   [place] IgE      Class 0/I kU/L 0.16 !   Pecan, [place] IgE     Class I kU/L 0.46 !   White Mulberry IgE     Class 0 kU/L <0.10   Ragweed, Short IgE     Class 0 kU/L <0.10   Pigweed, Rough IgE     Class 0 kU/L <0.10   Sheep Sorrel IgE Qn     Class 0 kU/L <0.10   Mouse Urine IgE     Class 0 kU/L <0.10   WBC     3.4 - 10.8 x10E3/uL 7.5   RBC     3.77 - 5.28 x10E6/uL 3.79   Hemoglobin     11.1 - 15.9 g/dL 81.1   HCT     91.4 - 78.2 % 36.9   MCV     79 - 97 fL 97   MCH     26.6 - 33.0 pg 33.2 (H)   MCHC     31.5 - 35.7 g/dL 95.6   RDW     21.3 - 08.6 % 14.6   Neutrophils     Not Estab. % 43   Lymphs     Not Estab. % 49   Monocytes     Not Estab. % 6   Eos     Not Estab. % 1   Basos     Not Estab. % 1   NEUT#  1.4 - 7.0 x10E3/uL 3.3   Lymphocyte #     0.7 - 3.1 x10E3/uL 3.6 (H)   Monocytes Absolute     0.1 - 0.9 x10E3/uL 0.5   EOS (ABSOLUTE)     0.0 - 0.4 x10E3/uL 0.1   Basophils Absolute     0.0 - 0.2 x10E3/uL 0.1   Immature Granulocytes     Not Estab. % 0   Immature Grans (Abs)     0.0 - 0.1 x10E3/uL 0.0      Spirometry: FEV1: 2.26L 84%, FVC: 3.19L 94%, ratio consistent with nonobstructive pattern  Assessment and plan: Allergic rhinitis with conjunctivitis Moderate persistent asthma  - Continue avoidance measures for dog dander, cat dander, tree pollen.  - Continue use of Xyzal (levocetirizine) 5mg  tablet once daily.  You can use an extra dose of the antihistamine, if needed, for breakthrough symptoms.  Dymista spray 1 spray each nostril twice a day as needed for runny or stuffy nose.   - Consider nasal saline rinses 1-2 times daily to remove allergens from the nasal cavities as well as help with mucous clearance (this is especially helpful to do before the nasal sprays are given)  - Daily controller medication(s): Symbicort 160/4.60mcg two puffs twice daily with spacer. - Prior to physical activity: albuterol 2 puffs 10-15 minutes before physical activity. -  Rescue medications: albuterol 2 puffs every 4-6 hours as needed  - Asthma control goals:  * Full participation in all desired activities (may need albuterol before activity) * Albuterol use two time or less a week on average (not counting use with activity) * Cough interfering with sleep two time or less a month * Oral steroids no more than once a year * No hospitalizations  Follow-up in 6 months or sooner if needed  I appreciate the opportunity to take part in Trivia's care. Please do not hesitate to contact me with questions.  Sincerely,   Margo Aye, MD Allergy/Immunology [facility]

## 2023-02-26 NOTE — Patient Instructions (Addendum)
-   Continue avoidance measures for dog dander, cat dander, tree pollen. - Continue use of Xyzal (levocetirizine) 5mg  tablet once daily.  You can use an extra dose of the antihistamine, if needed, for breakthrough symptoms.  Dymista spray 1 spray each nostril twice a day as needed for runny or stuffy nose.   - Consider nasal saline rinses 1-2 times daily to remove allergens from the nasal cavities as well as help with mucous clearance (this is especially helpful to do before the nasal sprays are given)  - Daily controller medication(s): Symbicort 160/4.27mcg two puffs twice daily with spacer. - Prior to physical activity: albuterol 2 puffs 10-15 minutes before physical activity. - Rescue medications: albuterol 2 puffs every 4-6 hours as needed  - Asthma control goals:  * Full participation in all desired activities (may need albuterol before activity) * Albuterol use two time or less a week on average (not counting use with activity) * Cough interfering with sleep two time or less a month * Oral steroids no more than once a year * No hospitalizations  Follow-up in 6 months or sooner if needed

## 2023-03-02 ENCOUNTER — Other Ambulatory Visit (HOSPITAL_COMMUNITY): Payer: Self-pay

## 2023-03-03 ENCOUNTER — Other Ambulatory Visit: Payer: Self-pay | Admitting: Allergy

## 2023-03-03 NOTE — Telephone Encounter (Signed)
Per our rep - bill has been taken care of - please let patient know as well.  Called patient - DOB verified -advised of above notation.  Patient verbalized understanding, no further questions.

## 2023-07-15 ENCOUNTER — Emergency Department (HOSPITAL_BASED_OUTPATIENT_CLINIC_OR_DEPARTMENT_OTHER): Payer: Medicaid Other

## 2023-07-15 ENCOUNTER — Encounter (HOSPITAL_COMMUNITY): Payer: Self-pay

## 2023-07-15 ENCOUNTER — Emergency Department (HOSPITAL_COMMUNITY)
Admission: EM | Admit: 2023-07-15 | Discharge: 2023-07-15 | Disposition: A | Discharge status: Home / Self Care | Payer: Medicaid Other | Attending: Emergency Medicine | Admitting: Emergency Medicine

## 2023-07-15 ENCOUNTER — Other Ambulatory Visit: Payer: Self-pay

## 2023-07-15 DIAGNOSIS — M79602 Pain in left arm: Secondary | ICD-10-CM | POA: Diagnosis present

## 2023-07-15 DIAGNOSIS — J449 Chronic obstructive pulmonary disease, unspecified: Secondary | ICD-10-CM | POA: Insufficient documentation

## 2023-07-15 DIAGNOSIS — M7989 Other specified soft tissue disorders: Secondary | ICD-10-CM | POA: Diagnosis not present

## 2023-07-15 LAB — CBC WITH DIFFERENTIAL/PLATELET
Abs Immature Granulocytes: 0.02 10*3/uL (ref 0.00–0.07)
Basophils Absolute: 0.1 10*3/uL (ref 0.0–0.1)
Basophils Relative: 1 %
Eosinophils Absolute: 0.1 10*3/uL (ref 0.0–0.5)
Eosinophils Relative: 1 %
HCT: 36.1 % (ref 36.0–46.0)
Hemoglobin: 12.2 g/dL (ref 12.0–15.0)
Immature Granulocytes: 0 %
Lymphocytes Relative: 42 %
Lymphs Abs: 3.6 10*3/uL (ref 0.7–4.0)
MCH: 33.6 pg (ref 26.0–34.0)
MCHC: 33.8 g/dL (ref 30.0–36.0)
MCV: 99.4 fL (ref 80.0–100.0)
Monocytes Absolute: 0.5 10*3/uL (ref 0.1–1.0)
Monocytes Relative: 6 %
Neutro Abs: 4.3 10*3/uL (ref 1.7–7.7)
Neutrophils Relative %: 50 %
Platelets: 373 10*3/uL (ref 150–400)
RBC: 3.63 MIL/uL — ABNORMAL LOW (ref 3.87–5.11)
RDW: 18.3 % — ABNORMAL HIGH (ref 11.5–15.5)
WBC: 8.5 10*3/uL (ref 4.0–10.5)
nRBC: 0 % (ref 0.0–0.2)

## 2023-07-15 LAB — BASIC METABOLIC PANEL
Anion gap: 9 (ref 5–15)
BUN: 12 mg/dL (ref 6–20)
CO2: 23 mmol/L (ref 22–32)
Calcium: 9.4 mg/dL (ref 8.9–10.3)
Chloride: 107 mmol/L (ref 98–111)
Creatinine, Ser: 0.75 mg/dL (ref 0.44–1.00)
GFR, Estimated: >60 mL/min (ref >60)
Glucose, Bld: 103 mg/dL — ABNORMAL HIGH (ref 70–99)
Potassium: 3.6 mmol/L (ref 3.5–5.1)
Sodium: 139 mmol/L (ref 135–145)

## 2023-07-15 MED ORDER — OXYCODONE-ACETAMINOPHEN 5-325 MG PO TABS: 1.0000 | ORAL_TABLET | Freq: Four times a day (QID) | ORAL | 0 refills | Status: AC | PRN

## 2023-07-15 MED ORDER — CEPHALEXIN 500 MG PO CAPS
500.0000 mg | ORAL_CAPSULE | Freq: Four times a day (QID) | ORAL | 0 refills | Status: AC
Start: 2023-07-15 — End: ?

## 2023-07-15 MED ORDER — ONDANSETRON 4 MG PO TBDP
4.0000 mg | ORAL_TABLET | Freq: Once | ORAL | Status: AC
  Administered 2023-07-15: 4 mg via ORAL
  Filled 2023-07-15: qty 1

## 2023-07-15 NOTE — ED Triage Notes (Signed)
 Pt sent by PCP for further evaluation of left arm pain. Pt has a tender knot on her left forearm just below her AC. No redness but it warm to touch. Pt sent here to rule out blood clot vs abscess.

## 2023-07-15 NOTE — ED Notes (Signed)
 Pt has large lump on left forearm approx the size of a tennis ball

## 2023-07-15 NOTE — ED Provider Triage Note (Signed)
 Emergency Medicine Provider Triage Evaluation Note  Anita Villarreal , a 53 y.o. female  was evaluated in triage.  Pt complains of concern of abscess.  Sent from PCP office..  Review of Systems  Positive: As above Negative: As above  Physical Exam  BP (!) 152/98 (BP Location: Right Arm)   Pulse (!) 115   Temp (!) 97.4 F (36.3 C)   Resp 20   Ht 5' 9 (1.753 m)   Wt 96.6 kg   LMP 04/04/2015 Comment: neg hcg- tubes tied  SpO2 100%   BMI 31.45 kg/m  Gen:   Awake, no distress   Resp:  Normal effort  MSK:   Moves extremities without difficulty  Other:    Medical Decision Making  Medically screening exam initiated at 12:37 PM.  Appropriate orders placed.  Anita Villarreal was informed that the remainder of the evaluation will be completed by another provider, this initial triage assessment does not replace that evaluation, and the importance of remaining in the ED until their evaluation is complete.     Hildegard Loge, PA-C 07/15/23 1237

## 2023-07-15 NOTE — Progress Notes (Signed)
 VASCULAR LAB    Left upper extremity venous duplex has been performed.  See CV proc for preliminary results.  Messaged results to Dr. Posey Rea via secure chat  Sherren Kerns, RVT 07/15/2023, 1:33 PM

## 2023-07-15 NOTE — ED Provider Notes (Signed)
 Anita Villar[facility] EMERG[facility]Y DEPARTMENT AT [facility] Provider Note   CSN: [id number] Arrival date & time: 07/15/23  1102     History Chief Complaint  Patient presents with   Arm Pain    Anita Villarreal is a 53 y.o. female he is he is good history of COPD, anemia who presents to the emergency department today with a 2-week history of left arm pain and swelling.  She does not recall injuring the area in the last 2 weeks but she does recall a pipe bursting 4 weeks ago and her house and she was scrambling to turn the water  off to fix that.  She states that she might of hit something then but nothing since then.  She does receive iron infusions but has not received any iron infusion since October and does not recall which arm she had the IV placed in.  She denies any fever, chills, arm weakness or numbness.   Arm Pain       Home Medications Prior to Admission medications   Medication Sig Start Date End Date Taking? Authorizing Provider  cephALEXin  (KEFLEX ) 500 MG capsule Take 1 capsule (500 mg total) by mouth 4 (four) times daily. 07/15/23  Yes Jeanetta Alonzo M, PA-C  oxyCODONE -acetaminophen  (PERCOCET/ROXICET) 5-325 MG tablet Take 1 tablet by mouth every 6 (six) hours as needed for severe pain (pain score 7-10). 07/15/23  Yes Olumide Dolinger M, PA-C  AJOVY 225 MG/1.5ML SOAJ Inject into the skin.    [provider]  albuterol  (PROVENTIL ) (2.5 MG/3ML) 0.083% nebulizer solution Take 3 mLs (2.5 mg total) by nebulization every 4 (four) hours as needed for wheezing or shortness of breath. 02/26/23   Anita Danita Macintosh, MD  albuterol  (VENTOLIN  HFA) 108 (90 Base) MCG/ACT inhaler Inhale 2 puffs into the lungs every 6 (six) hours as needed for wheezing or shortness of breath. 02/26/23   Padgett, Danita Macintosh, MD  benzonatate (TESSALON) 100 MG capsule Take 100 mg by mouth 3 (three) times daily. 11/03/22 11/03/23  [provider]  budesonide -formoterol  (SYMBICORT ) 160-4.5  MCG/ACT inhaler Inhale 2 puffs into the lungs 2 (two) times daily. 02/26/23   Anita Danita Macintosh, MD  cetirizine (ZYRTEC) 10 MG tablet Take 10 mg by mouth daily. 09/03/22   [provider]  cholecalciferol (VITAMIN D3) 25 MCG (1000 UNIT) tablet Take 1,000 Units by mouth daily.    [provider]  cycloSPORINE (RESTASIS) 0.05 % ophthalmic emulsion Place 1 drop into both eyes 2 (two) times daily.    [provider]  diazepam (VALIUM) 5 MG tablet Take 5 mg by mouth every 6 (six) hours as needed for anxiety. 07/15/22   [provider]  dicyclomine  (BENTYL ) 20 MG tablet Take 20 mg by mouth 4 (four) times daily.    [provider]  diphenhydrAMINE  (BENADRYL ) 25 mg capsule Take 25 mg by mouth every 6 (six) hours as needed for allergies.    [provider]  DYMISTA  137-50 MCG/ACT SUSP SHAKE LIQUID AND USE 1 SPRAY IN EACH NOSTRIL TWICE DAILY AS NEEDED FOR RUNNY NOSE OR DRAINAGE 03/03/23   Anita Danita Macintosh, MD  guaiFENesin  (MUCINEX ) 600 MG 12 hr tablet Take 1 tablet (600 mg total) by mouth 2 (two) times daily. 10/06/16   Anita Noa, MD  hydrOXYzine  (ATARAX ) 50 MG tablet Take 50 mg by mouth 3 (three) times daily as needed for anxiety.    [provider]  hydrOXYzine  (VISTARIL ) 100 MG capsule Take 200 mg by mouth at bedtime.  [provider]  LAGEVRIO 200 MG CAPS capsule Take 4 capsules by mouth 2 (two) times daily. 07/14/22   [provider]  lamoTRIgine (LAMICTAL) 100 MG tablet Take 100 mg by mouth daily.  10/20/19   [provider]  levocetirizine (XYZAL ) 5 MG tablet Take 1 tablet (5 mg total) by mouth every evening. 02/26/23   Anita Danita Macintosh, MD  lithium  carbonate 300 MG capsule Take 600 mg by mouth 2 (two) times daily.     [provider]  loperamide (IMODIUM) 2 MG capsule Take 2 mg by mouth as needed.    [provider]  lurasidone (LATUDA) 40 MG TABS tablet Take 40 mg by mouth  daily with breakfast.    [provider]  metFORMIN (GLUCOPHAGE) 1000 MG tablet Take 1,000 mg by mouth 2 (two) times daily.    [provider]  metoCLOPramide  (REGLAN ) 5 MG tablet Take 5 mg by mouth every 6 (six) hours as needed for refractory nausea / vomiting. 08/31/22   [provider]  nicotine  (NICODERM CQ  - DOSED IN MG/24 HOURS) 14 mg/24hr patch Place 14 mg onto the skin daily.    [provider]  omeprazole (PRILOSEC) 40 MG capsule Take 40 mg by mouth daily. 12/12/21   [provider]  ondansetron  (ZOFRAN -ODT) 4 MG disintegrating tablet Take 4 mg by mouth every 8 (eight) hours as needed for refractory nausea / vomiting. 07/21/22   [provider]  pantoprazole  (PROTONIX ) 40 MG tablet Take 1 tablet (40 mg total) by mouth daily. 10/06/16   Anita, Mutaz, MD  polyethylene glycol (MIRALAX  / GLYCOLAX ) packet Take 17 g by mouth daily as needed. Patient taking differently: Take 17 g by mouth daily as needed. For constipation. 07/27/16   Anita Almarie RAMAN, PA-C  prazosin (MINIPRESS) 5 MG capsule Take 5 mg by mouth at bedtime.    [provider]  promethazine  (PHENERGAN ) 25 MG tablet Take 25-50 mg by mouth See admin instructions. Along with each dose of Imitrex  as needed for nausea accompanying migraines 04/07/13   [provider]  Spacer/Aero-Holding Raguel FRENCH Use as directed with metered dose inhaler. 11/25/22   Anita Danita Macintosh, MD  SUMAtriptan  (IMITREX ) 100 MG tablet Take 100 mg by mouth See admin instructions. Once as needed for migraine (may repeat once in 2 hours if no relief) 12/20/14   [provider]  SYMBICORT  160-4.5 MCG/ACT inhaler Inhale 2 puffs into the lungs in the morning and at bedtime. 11/25/22   Anita Danita Macintosh, MD  tiZANidine (ZANAFLEX) 2 MG tablet Take 2 mg by mouth every 8 (eight) hours as needed for muscle spasms. 11/23/22   [provider]  topiramate (TOPAMAX) 50 MG tablet  Take 50 mg by mouth 2 (two) times daily.  10/20/19   [provider]  traZODone (DESYREL) 50 MG tablet Take 100 mg by mouth at bedtime.  10/20/19   [provider]  VENTOLIN  HFA 108 (90 Base) MCG/ACT inhaler INHALE 2 PUFFS INTO THE LUNGS EVERY 6 HOURS AS NEEDED FOR WHEEZING OR SHORTNESS OF BREATH 01/18/23   Anita Danita Macintosh, MD  ziprasidone  (GEODON ) 80 MG capsule Take 160 mg by mouth at bedtime.     [provider]  zolpidem  (AMBIEN ) 10 MG tablet Take 10 mg by mouth at bedtime.    [provider]      Allergies    Doxepin, Erythromycin, Nsaids, Pantoprazole , and Sulfa antibiotics    Review of Systems   Review of Systems  All other systems reviewed and are negative.   Physical Exam Updated Vital Signs BP 120/80   Pulse 85   Temp 98.6 F (37 C)   Resp 20   Ht 5' 9 (1.753 m)   Wt 96.6 kg   LMP 04/04/2015 Comment: neg hcg- tubes tied  SpO2 100%   BMI 31.45 kg/m  Physical Exam Vitals and nursing note reviewed.  Constitutional:      Appearance: Normal appearance.  HENT:     Head: Normocephalic and atraumatic.  Eyes:     General:        Right eye: No discharge.        Left eye: No discharge.     Conjunctiva/sclera: Conjunctivae normal.  Pulmonary:     Effort: Pulmonary effort is normal.  Musculoskeletal:     Comments: There is some fullness to the left upper extremity primarily in the left forearm.  Area is minimally warm to palpation and there is no surrounding erythema or obvious purulent drainage.  Patient has good cap refill distally and good sensation distally.  She has full range of motion in the arm although it is painful with movement secondary to the area in question.  2+ radial pulse felt in the left wrist.  Skin:    General: Skin is warm and dry.     Findings: No rash.  Neurological:     General: No focal deficit present.     Mental Status: She is alert.  Psychiatric:        Mood and Affect: Mood normal.         Behavior: Behavior normal.     ED Results / Procedures / Treatments   Labs (all labs ordered are listed, but only abnormal results are displayed) Labs Reviewed  CBC WITH DIFFERENTIAL/PLATELET - Abnormal; Notable for the following components:      Result Value   RBC 3.63 (*)    RDW 18.3 (*)    All other components within normal limits  BASIC METABOLIC PANEL - Abnormal; Notable for the following components:   Glucose, Bld 103 (*)    All other components within normal limits    EKG None  Radiology UE VENOUS DUPLEX (7am - 7pm) Result Date: 07/15/2023 UPPER VENOUS STUDY  Patient Name:  NETHRA MEHLBERG  Date of Exam:   07/15/2023 Medical Rec #: [id number]            Accession #:    [id number] Date of Birth: [birth year]            Patient Gender: F Patient Age:   51 years Exam Location:  [facility] Procedure:      VAS US  UPPER EXTREMITY VENOUS DUPLEX Referring Phys: MADISON KOMMOR --------------------------------------------------------------------------------  Indications: Palpable mass in left proximal to mid forearm Comparison Study: No prior study on file Performing Technologist: Alberta Lis RVS  Examination Guidelines: A complete evaluation includes B-mode imaging, spectral Doppler, color Doppler, and power Doppler as needed of all accessible portions of each vessel. Bilateral testing is considered an integral part of a complete examination. Limited examinations for reoccurring indications may be performed as noted.  Right Findings: +----------+------------+---------+-----------+----------+-------+ RIGHT     CompressiblePhasicitySpontaneousPropertiesSummary +----------+------------+---------+-----------+----------+-------+ Subclavian               Yes       Yes                      +----------+------------+---------+-----------+----------+-------+  Left Findings: +----------+------------+---------+-----------+----------+-------+ LEFT  CompressiblePhasicitySpontaneousPropertiesSummary +----------+------------+---------+-----------+----------+-------+ IJV           Full       Yes       Yes                      +----------+------------+---------+-----------+----------+-------+ Subclavian               Yes       Yes                      +----------+------------+---------+-----------+----------+-------+ Axillary                 Yes       Yes                      +----------+------------+---------+-----------+----------+-------+ Brachial      Full                                          +----------+------------+---------+-----------+----------+-------+ Radial        Full                                          +----------+------------+---------+-----------+----------+-------+ Ulnar         Full                                          +----------+------------+---------+-----------+----------+-------+ Cephalic      Full                                          +----------+------------+---------+-----------+----------+-------+ Basilic       Full                                          +----------+------------+---------+-----------+----------+-------+  Summary:  Right: No evidence of superficial vein thrombosis in the upper extremity.  Left: No evidence of deep vein thrombosis in the upper extremity. No evidence of superficial vein thrombosis in the upper extremity. There is a mass like structure measuring 4 cm X 2.6 cm in the mid to proximal forearm, at site of concern, that does appear to have some vascularization. Suggest soft tissue ultrasound or CT to further assess area if clinically indicated.  *See table(s) above for measurements and observations.    Preliminary     Procedures Procedures    Medications Ordered in ED Medications  ondansetron  (ZOFRAN -ODT) disintegrating tablet 4 mg (4 mg Oral Given 07/15/23 1238)    ED Course/ Medical Decision Making/ A&P   {   Click here  for ABCD2, HEART and other calculators  Medical Decision Making ALUEL SCHWARZ is a 53 y.o. female patient who presents to the emergency department today for further evaluation of left arm pain.  I placed an ultrasound probe over the area I do not see any obvious fluid collection.  Area is not significantly warm to palpation or erythematous and I have a lower suspicion for obvious cellulitis at this time.  There is no obvious signs of systemic infection.  Patient's vital signs are normal.  No leukocytosis.  Platelet count is normal.  This could be underlying hematoma versus early cellulitis.  Will likely cover her with a short course of antibiotics and have her follow-up with her primary care doctor.  I will give her a short course of narcotic pain medication as patient is having similar range of motion secondary to pain.  Patient agreeable with plan.  Strict return precautions were discussed.  She is safe for discharge at this time.     Final Clinical Impression(s) / ED Diagnoses Final diagnoses:  Left arm pain    Rx / DC Orders ED Discharge Orders          Ordered    oxyCODONE -acetaminophen  (PERCOCET/ROXICET) 5-325 MG tablet  Every 6 hours PRN        07/15/23 1551    cephALEXin  (KEFLEX ) 500 MG capsule  4 times daily        07/15/23 1551              Theotis Peers [place], [place] 07/15/23 1552    Bari Roxie HERO, DO 07/15/23 1829

## 2023-09-01 ENCOUNTER — Encounter: Payer: Self-pay | Admitting: Allergy

## 2023-09-01 ENCOUNTER — Other Ambulatory Visit: Payer: Self-pay

## 2023-09-01 ENCOUNTER — Ambulatory Visit (INDEPENDENT_AMBULATORY_CARE_PROVIDER_SITE_OTHER): Payer: Medicaid Other | Admitting: Allergy

## 2023-09-01 VITALS — BP 116/74 | HR 102 | Temp 98.3°F | Ht 69.0 in | Wt 207.0 lb

## 2023-09-01 DIAGNOSIS — H1013 Acute atopic conjunctivitis, bilateral: Secondary | ICD-10-CM | POA: Diagnosis not present

## 2023-09-01 DIAGNOSIS — J302 Other seasonal allergic rhinitis: Secondary | ICD-10-CM

## 2023-09-01 DIAGNOSIS — J3089 Other allergic rhinitis: Secondary | ICD-10-CM

## 2023-09-01 DIAGNOSIS — J4540 Moderate persistent asthma, uncomplicated: Secondary | ICD-10-CM

## 2023-09-01 MED ORDER — LEVOCETIRIZINE DIHYDROCHLORIDE 5 MG PO TABS: 5.0000 mg | ORAL_TABLET | Freq: Two times a day (BID) | ORAL | 5 refills | Status: DC

## 2023-09-01 MED ORDER — SYMBICORT 160-4.5 MCG/ACT IN AERO: 2.0000 | INHALATION_SPRAY | Freq: Two times a day (BID) | RESPIRATORY_TRACT | 2 refills | Status: DC

## 2023-09-01 MED ORDER — ALBUTEROL SULFATE (2.5 MG/3ML) 0.083% IN NEBU: 2.5000 mg | INHALATION_SOLUTION | RESPIRATORY_TRACT | 1 refills | Status: DC | PRN

## 2023-09-01 MED ORDER — VENTOLIN HFA 108 (90 BASE) MCG/ACT IN AERS
2.0000 | INHALATION_SPRAY | Freq: Four times a day (QID) | RESPIRATORY_TRACT | 1 refills | Status: AC | PRN
Start: 2023-09-01 — End: ?

## 2023-09-01 MED ORDER — AZELASTINE-FLUTICASONE 137-50 MCG/ACT NA SUSP: NASAL | 5 refills | Status: AC

## 2023-09-01 NOTE — Progress Notes (Signed)
 Follow-up Note  RE: Anita Villarreal MRN: [id number] DOB: [birth year] Date of Office Visit: 09/01/2023   History of present illness: Anita Villarreal is a 53 y.o. female presenting today for follow-up of allergic rhinitis with conjunctivitis, asthma.  She was last seen in the office on 02/26/23 by myself.   Discussed the use of AI scribe software for clinical note transcription with the patient, who gave verbal consent to proceed.  Since August, she has experienced intermittent allergy symptoms, which she attributes to seasonal changes. She uses Xyzal, taking 5 mg twice daily, and states it is more effective at this double dose. She also uses a nasal spray (dymista) as needed for runny or stuffy nose, finding it effective and convenient.  She uses a rescue inhaler, primarily at night, due to coughing and difficulty sleeping. She uses the rescue inhaler multiple times a week, especially during weather changes. She continues to use Symbicort, two puffs twice daily, as part of maintenance regimen. she has not experienced COVID or the flu this winter but had a cold treated with antibiotics around January, during which she increased use of the rescue inhaler.  She mentions that she should use the nebulizer more often but avoid it due to the jitteriness it causes and delay using it until symptoms worsen.     Review of systems: 10pt ROS negative unless noted above in HPI   Past medical/social/surgical/family history have been reviewed and are unchanged unless specifically indicated below.  No changes  Medication List: Current Outpatient Medications  Medication Sig Dispense Refill   AJOVY 225 MG/1.5ML SOAJ Inject into the skin.     albuterol (PROVENTIL) (2.5 MG/3ML) 0.083% nebulizer solution Take 3 mLs (2.5 mg total) by nebulization every 4 (four) hours as needed for wheezing or shortness of breath. 75 mL 1   albuterol (VENTOLIN HFA) 108 (90 Base) MCG/ACT inhaler Inhale 2 puffs into the  lungs every 6 (six) hours as needed for wheezing or shortness of breath. 18 g 1   benzonatate (TESSALON) 100 MG capsule Take 100 mg by mouth 3 (three) times daily.     budesonide-formoterol (SYMBICORT) 160-4.5 MCG/ACT inhaler Inhale 2 puffs into the lungs 2 (two) times daily. 10.2 g 5   cholecalciferol (VITAMIN D3) 25 MCG (1000 UNIT) tablet Take 1,000 Units by mouth daily.     dicyclomine (BENTYL) 20 MG tablet Take 20 mg by mouth 4 (four) times daily.     DYMISTA 137-50 MCG/ACT SUSP SHAKE LIQUID AND USE 1 SPRAY IN EACH NOSTRIL TWICE DAILY AS NEEDED FOR RUNNY NOSE OR DRAINAGE 23 g 5   lamoTRIgine (LAMICTAL) 100 MG tablet Take 100 mg by mouth daily.      levocetirizine (XYZAL) 5 MG tablet Take 1 tablet (5 mg total) by mouth every evening. 30 tablet 5   lithium carbonate 300 MG capsule Take 600 mg by mouth 2 (two) times daily.      loperamide (IMODIUM) 2 MG capsule Take 2 mg by mouth as needed.     lurasidone (LATUDA) 40 MG TABS tablet Take 40 mg by mouth daily with breakfast.     metFORMIN (GLUCOPHAGE) 1000 MG tablet Take 1,000 mg by mouth 2 (two) times daily.     methocarbamol (ROBAXIN) 500 MG tablet Take 500 mg by mouth 3 (three) times daily.     omeprazole (PRILOSEC) 40 MG capsule Take 40 mg by mouth daily.     ondansetron (ZOFRAN-ODT) 4 MG disintegrating tablet Take 4 mg by mouth every  8 (eight) hours as needed for refractory nausea / vomiting.     Spacer/Aero-Holding Rudean Curt Use as directed with metered dose inhaler. 1 each 1   SYMBICORT 160-4.5 MCG/ACT inhaler Inhale 2 puffs into the lungs in the morning and at bedtime. 10.2 g 2   topiramate (TOPAMAX) 50 MG tablet Take 50 mg by mouth 2 (two) times daily.      VENTOLIN HFA 108 (90 Base) MCG/ACT inhaler INHALE 2 PUFFS INTO THE LUNGS EVERY 6 HOURS AS NEEDED FOR WHEEZING OR SHORTNESS OF BREATH 18 g 1   zolpidem (AMBIEN) 10 MG tablet Take 10 mg by mouth at bedtime.     cephALEXin (KEFLEX) 500 MG capsule Take 1 capsule (500 mg total) by mouth  4 (four) times daily. (Patient not taking: Reported on 09/01/2023) 20 capsule 0   cetirizine (ZYRTEC) 10 MG tablet Take 10 mg by mouth daily. (Patient not taking: Reported on 09/01/2023)     cycloSPORINE (RESTASIS) 0.05 % ophthalmic emulsion Place 1 drop into both eyes 2 (two) times daily. (Patient not taking: Reported on 09/01/2023)     diazepam (VALIUM) 5 MG tablet Take 5 mg by mouth every 6 (six) hours as needed for anxiety. (Patient not taking: Reported on 09/01/2023)     diphenhydrAMINE (BENADRYL) 25 mg capsule Take 25 mg by mouth every 6 (six) hours as needed for allergies. (Patient not taking: Reported on 09/01/2023)     guaiFENesin (MUCINEX) 600 MG 12 hr tablet Take 1 tablet (600 mg total) by mouth 2 (two) times daily. (Patient not taking: Reported on 09/01/2023) 30 tablet 0   hydrOXYzine (ATARAX) 50 MG tablet Take 50 mg by mouth 3 (three) times daily as needed for anxiety. (Patient not taking: Reported on 09/01/2023)     hydrOXYzine (VISTARIL) 100 MG capsule Take 200 mg by mouth at bedtime. (Patient not taking: Reported on 09/01/2023)     LAGEVRIO 200 MG CAPS capsule Take 4 capsules by mouth 2 (two) times daily. (Patient not taking: Reported on 09/01/2023)     metoCLOPramide (REGLAN) 5 MG tablet Take 5 mg by mouth every 6 (six) hours as needed for refractory nausea / vomiting. (Patient not taking: Reported on 09/01/2023)     nicotine (NICODERM CQ - DOSED IN MG/24 HOURS) 14 mg/24hr patch Place 14 mg onto the skin daily. (Patient not taking: Reported on 09/01/2023)     oxyCODONE-acetaminophen (PERCOCET/ROXICET) 5-325 MG tablet Take 1 tablet by mouth every 6 (six) hours as needed for severe pain (pain score 7-10). (Patient not taking: Reported on 09/01/2023) 10 tablet 0   pantoprazole (PROTONIX) 40 MG tablet Take 1 tablet (40 mg total) by mouth daily. (Patient not taking: Reported on 09/01/2023) 30 tablet 1   polyethylene glycol (MIRALAX / GLYCOLAX) packet Take 17 g by mouth daily as needed. (Patient taking  differently: Take 17 g by mouth daily as needed. For constipation.) 14 each 0   prazosin (MINIPRESS) 5 MG capsule Take 5 mg by mouth at bedtime. (Patient not taking: Reported on 09/01/2023)     promethazine (PHENERGAN) 25 MG tablet Take 25-50 mg by mouth See admin instructions. Along with each dose of Imitrex as needed for nausea accompanying migraines (Patient not taking: Reported on 09/01/2023)     SUMAtriptan (IMITREX) 100 MG tablet Take 100 mg by mouth See admin instructions. Once as needed for migraine (may repeat once in 2 hours if no relief) (Patient not taking: Reported on 09/01/2023)     tiZANidine (ZANAFLEX) 2 MG tablet Take  2 mg by mouth every 8 (eight) hours as needed for muscle spasms. (Patient not taking: Reported on 09/01/2023)     traZODone (DESYREL) 50 MG tablet Take 100 mg by mouth at bedtime.  (Patient not taking: Reported on 09/01/2023)     ziprasidone (GEODON) 80 MG capsule Take 160 mg by mouth at bedtime.  (Patient not taking: Reported on 09/01/2023)     No current facility-administered medications for this visit.     Known medication allergies: Allergies  Allergen Reactions   Doxepin Itching   Erythromycin Swelling and Other (See Comments)    Tongue swelling    Nsaids Other (See Comments)    BLEEDING (internally)   Pantoprazole Itching and Rash    Itchy eyes and throat   Sulfa Antibiotics Swelling, Other (See Comments) and Rash    Swelling of the tongue swelling     Physical examination: Blood pressure 116/74, pulse (!) 102, temperature 98.3 F (36.8 C), temperature source Temporal, height 5\' 9"  (1.753 m), weight 207 lb (93.9 kg), last menstrual period 04/04/2015, SpO2 98%.  General: Alert, interactive, in no acute distress. HEENT: PERRLA, TMs pearly gray, turbinates minimally edematous without discharge, post-pharynx non erythematous. Neck: Supple without lymphadenopathy. Lungs: Clear to auscultation without wheezing, rhonchi or rales. {no increased work of  breathing. CV: Normal S1, S2 without murmurs. Abdomen: Nondistended, nontender. Skin: Warm and dry, without lesions or rashes. Extremities:  No clubbing, cyanosis or edema. Neuro:   Grossly intact.  Diagnositics/Labs:  Spirometry: FEV1: 2.2L 82%, FVC: 2.76L 82%, ratio consistent with nonobstructive pattern  Assessment and plan: Allergic rhinitis with asthma conjunctivitis Moderate persistent asthma   - Continue avoidance measures for dog dander, cat dander, tree pollen. - Continue use of Xyzal (levocetirizine) 5mg  1-2tablets daily.  - Continue Dymista spray 1 spray each nostril twice a day as needed for runny or stuffy nose.   - Consider nasal saline rinses 1-2 times daily to remove allergens from the nasal cavities as well as help with mucous clearance (this is especially helpful to do before the nasal sprays are given)  - Daily controller medication(s): Symbicort 160/4.55mcg two puffs twice daily with spacer. - Prior to physical activity: albuterol 2 puffs 10-15 minutes before physical activity. - Rescue medications: AIRSUPRA or albuterol 2 puffs every 4-6 hours as needed AIRSUPRA is a combination rescue inhaler with albuterol + budesonide which typically provides longer lasting results than plain albuterol alone.  AIRSUPRA sample provided today and use this to replace albuterol use.   - Asthma control goals:  * Full participation in all desired activities (may need albuterol before activity) * Albuterol use two time or less a week on average (not counting use with activity) * Cough interfering with sleep two time or less a month * Oral steroids no more than once a year * No hospitalizations  Follow-up in 6-12 months or sooner if needed   I appreciate the opportunity to take part in Alyne's care. Please do not hesitate to contact me with questions.  Sincerely,   Margo Aye, MD Allergy/Immunology [facility]

## 2023-09-01 NOTE — Patient Instructions (Addendum)
-   Continue avoidance measures for dog dander, cat dander, tree pollen. - Continue use of Xyzal (levocetirizine) 5mg  1-2tablets daily.  - Continue Dymista spray 1 spray each nostril twice a day as needed for runny or stuffy nose.   - Consider nasal saline rinses 1-2 times daily to remove allergens from the nasal cavities as well as help with mucous clearance (this is especially helpful to do before the nasal sprays are given)  - Daily controller medication(s): Symbicort 160/4.35mcg two puffs twice daily with spacer. - Prior to physical activity: albuterol 2 puffs 10-15 minutes before physical activity. - Rescue medications: AIRSUPRA or albuterol 2 puffs every 4-6 hours as needed AIRSUPRA is a combination rescue inhaler with albuterol + budesonide which typically provides longer lasting results than plain albuterol alone.  AIRSUPRA sample provided today and use this to replace albuterol use.   - Asthma control goals:  * Full participation in all desired activities (may need albuterol before activity) * Albuterol use two time or less a week on average (not counting use with activity) * Cough interfering with sleep two time or less a month * Oral steroids no more than once a year * No hospitalizations  Follow-up in 6-12 months or sooner if needed

## 2023-12-29 ENCOUNTER — Telehealth: Payer: Self-pay

## 2023-12-29 MED ORDER — CROMOLYN SODIUM 4 % OP SOLN
2.0000 [drp] | Freq: Four times a day (QID) | OPHTHALMIC | 2 refills | Status: DC
Start: 2023-12-29 — End: 2024-04-12

## 2023-12-29 NOTE — Telephone Encounter (Signed)
 Patient called stating her allergy medication is not working. She state she is having a hard time breathing through her nose. I did confirm that she wasn't having trouble breathing in general and she stated she could breath its just hard to through her nose. She also stated that her eyes are itchy and is wondering if Dr. Jeneal could recommended any eye drops. Spoke with Dr. Jeneal she stated to have patient use Afrin no more than 3-5 days max prior to using her nasal spray to open up her nasal cavity. Dr. Jeneal also stated to send in cromolyn eye drops. Informed patient of Dr. Rogenia recommendations and patient verified understanding. Patient will call back if no relief.

## 2024-01-05 ENCOUNTER — Telehealth: Payer: Self-pay | Admitting: Allergy

## 2024-01-05 MED ORDER — AIRSUPRA 90-80 MCG/ACT IN AERO
2.0000 | INHALATION_SPRAY | RESPIRATORY_TRACT | 1 refills | Status: DC | PRN
Start: 2024-01-05 — End: 2024-01-13

## 2024-01-05 NOTE — Telephone Encounter (Signed)
 Left message letting patient know that I sent in inhaler to [facility] and that it might not be ready today as we might have to get it authorized first. Asked her to call us  if she has any questions.

## 2024-01-05 NOTE — Telephone Encounter (Signed)
 Mirca called and stated that she was given samples of Airsupra from Dr. Jeneal, and that it worked well for her and she would like to have a prescription called in for it. She also scheduled an OV for July to discuss allergy shots. She uses [facility] on [address]. Best contact [phone].

## 2024-01-11 NOTE — Progress Notes (Signed)
 [address] [place] [address] Dept: [phone]  FOLLOW UP NOTE  Patient ID: Anita Villarreal, female    DOB: 1971/03/28  Age: 53 y.o. MRN: [id number] Date of Office Visit: 01/13/2024  Assessment  Chief Complaint: Wheezing, Cough, and Nasal Congestion  HPI Anita Villarreal is a 53 year old female who presents to the clinic for a follow-up visit.  She was last seen in this clinic on 09/01/2023 by Dr. Jeneal for evaluation of asthma, allergic rhinitis, and allergic conjunctivitis.    At today's visit, she reports that her asthma has been poorly controlled over the last 1 month with symptoms including shortness of breath with activity, occasional wheeze, and dry cough.  She continues Symbicort  160-2 puffs once a day Airsupra  2 puffs daily along and reports that she has been using her albuterol  via nebulizer 4 times a day over the last several days with moderate relief of symptoms.  She reports heat and humidity aggravate her asthma symptoms.  Chart review indicates absolute eosinophil count 100 on 07/15/2023 as well as total IgE 143 and allergy to dog dander.  Allergic rhinitis is reported as poorly controlled with symptoms including nasal congestion, rhinorrhea, sneezing, and postnasal drainage.  She denies fever, sweats, chills, sick contacts, or recent illness..  She continues Afrin nasal spray frequently and Xyzal  daily.  She reports that she has previously used Dymista , however, she experienced a worsening of symptoms after using Dynastat. Her last environmental allergy testing via lab on 11/25/2022 was positive to tree pollen, dog, and cat.  Allergic conjunctivitis is reported as moderately well-controlled with occasional red and itchy eyes for which she continues an over-the-counter eyedrop with relief of symptoms.  Her current medications are listed in the chart.  Drug Allergies:  Allergies  Allergen Reactions   Doxepin Itching   Erythromycin Swelling and Other (See  Comments)    Tongue swelling    Nsaids Other (See Comments)    BLEEDING (internally)   Pantoprazole  Itching and Rash    Itchy eyes and throat   Sulfa Antibiotics Swelling, Other (See Comments) and Rash    Swelling of the tongue swelling    Physical Exam: BP 90/60   Pulse 79   Temp 98.3 F (36.8 C)   Resp 16   Ht 5' 9 (1.753 m)   Wt 212 lb 12.8 oz (96.5 kg)   LMP 04/04/2015 Comment: neg hcg- tubes tied  SpO2 98%   BMI 31.43 kg/m    Physical Exam Vitals reviewed.  Constitutional:      Appearance: Normal appearance.  HENT:     Head: Normocephalic and atraumatic.     Right Ear: Tympanic membrane normal.     Left Ear: Tympanic membrane normal.     Nose:     Comments: Bilateral nares edematous and erythematous with clear nasal drainage noted.  Pharynx normal.  Ears normal.  Eyes normal.    Mouth/Throat:     Pharynx: Oropharynx is clear.  Eyes:     Conjunctiva/sclera: Conjunctivae normal.  Cardiovascular:     Rate and Rhythm: Normal rate and regular rhythm.     Heart sounds: Normal heart sounds. No murmur heard. Pulmonary:     Effort: Pulmonary effort is normal.     Breath sounds: Normal breath sounds.     Comments: Lungs clear to auscultation Musculoskeletal:        General: Normal range of motion.     Cervical back: Normal range of motion and neck supple.  Skin:  General: Skin is warm and dry.  Neurological:     Mental Status: She is alert and oriented to person, place, and time.  Psychiatric:        Mood and Affect: Mood normal.        Behavior: Behavior normal.        Thought Content: Thought content normal.        Judgment: Judgment normal.     Diagnostics: FVC 2.40 which is 71% of predicted value, FEV1 1.82 which is 68% of predicted value.  Spirometry indicates possible restriction.  Postbronchodilator spirometry indicates 8% improvement in FEV1.  Assessment and Plan: 1. Not well controlled moderate persistent asthma with acute exacerbation   2.  Seasonal and perennial allergic rhinitis   3. Allergic conjunctivitis of both eyes     Meds ordered this encounter  Medications   albuterol  (PROVENTIL ) (2.5 MG/3ML) 0.083% nebulizer solution    Sig: Take 3 mLs (2.5 mg total) by nebulization every 4 (four) hours as needed for wheezing or shortness of breath.    Dispense:  75 mL    Refill:  1   albuterol  (VENTOLIN  HFA) 108 (90 Base) MCG/ACT inhaler    Sig: Inhale 2 puffs into the lungs every 6 (six) hours as needed for wheezing or shortness of breath.    Dispense:  18 g    Refill:  1   budesonide -formoterol  (SYMBICORT ) 160-4.5 MCG/ACT inhaler    Sig: Inhale 2 puffs into the lungs 2 (two) times daily.    Dispense:  10.2 g    Refill:  5   levocetirizine (XYZAL ) 5 MG tablet    Sig: Take 1 tablet (5 mg total) by mouth daily as needed for allergies.    Dispense:  60 tablet    Refill:  5   SYMBICORT  160-4.5 MCG/ACT inhaler    Sig: Inhale 2 puffs into the lungs in the morning and at bedtime.    Dispense:  10.2 g    Refill:  2   predniSONE  (DELTASONE ) 10 MG tablet    Sig: Prednisone  10 mg tablets. Take 2 tablets once a day for 4 days, then take 1 tablet on the 5th day, then stop.    Dispense:  9 tablet    Refill:  0    Patient Instructions  Asthma Prednisone  10 mg tablets. Take 2 tablets once a day for 4 days, then take 1 tablet on the 5th day, then stop.   Increase Symbicort  160 to 2 puffs twice a day with a spacer to prevent cough or wheeze Continue albuterol  2 puffs every 4 hours as needed for cough or wheeze OR Instead use albuterol  0.083% solution via nebulizer one unit vial every 4 hours as needed for cough or wheeze  Allergic rhinitis Continue allergen avoidance measures directed toward tree pollen, dog, and cat as listed below Continue Xyzal  5 mg once a day if needed for runny nose or itch Begin Ryaltris 2 sprays in each nostril up to twice a day if needed for nasal symptoms. This will replace Dymista  Consider saline nasal  rinses as needed for nasal symptoms. Use this before any medicated nasal sprays for best result Consider allergen immunotherapy if your symptoms are not well-controlled with the treatment plan as listed above.  Written information provided at today's visit.  Call the clinic if you are interested in this option.  Allergic conjunctivitis Some over the counter eye drops include Pataday one drop in each eye once a day as needed for red,  itchy eyes OR Zaditor one drop in each eye twice a day as needed for red itchy eyes. Avoid eye drops that say red eye relief as they may contain medications that dry out your eyes.   Call the clinic if this treatment plan is not working well for you.  Follow up in 2 months or sooner if needed.   Return in about 2 months (around 03/15/2024), or if symptoms worsen or fail to improve.    Thank you for the opportunity to care for this patient.  Please do not hesitate to contact me with questions.  Arlean Mutter, FNP [facility] 

## 2024-01-11 NOTE — Patient Instructions (Incomplete)
 Asthma Prednisone  10 mg tablets. Take 2 tablets once a day for 4 days, then take 1 tablet on the 5th day, then stop.   Increase Symbicort  160 to 2 puffs twice a day with a spacer to prevent cough or wheeze Continue albuterol  2 puffs every 4 hours as needed for cough or wheeze OR Instead use albuterol  0.083% solution via nebulizer one unit vial every 4 hours as needed for cough or wheeze  Allergic rhinitis Continue allergen avoidance measures directed toward tree pollen, dog, and cat as listed below Continue Xyzal  5 mg once a day if needed for runny nose or itch Begin Ryaltris 2 sprays in each nostril up to twice a day if needed for nasal symptoms. This will replace Dymista  Consider saline nasal rinses as needed for nasal symptoms. Use this before any medicated nasal sprays for best result Consider allergen immunotherapy if your symptoms are not well-controlled with the treatment plan as listed above.  Written information provided at today's visit.  Call the clinic if you are interested in this option.  Allergic conjunctivitis Some over the counter eye drops include Pataday one drop in each eye once a day as needed for red, itchy eyes OR Zaditor one drop in each eye twice a day as needed for red itchy eyes. Avoid eye drops that say red eye relief as they may contain medications that dry out your eyes.   Call the clinic if this treatment plan is not working well for you.  Follow up in 2 months or sooner if needed.  Reducing Pollen Exposure The American Academy of Allergy, Asthma and Immunology suggests the following steps to reduce your exposure to pollen during allergy seasons. Do not hang sheets or clothing out to dry; pollen may collect on these items. Do not mow lawns or spend time around freshly cut grass; mowing stirs up pollen. Keep windows closed at night.  Keep car windows closed while driving. Minimize morning activities outdoors, a time when pollen counts are usually at their  highest. Stay indoors as much as possible when pollen counts or humidity is high and on windy days when pollen tends to remain in the air longer. Use air conditioning when possible.  Many air conditioners have filters that trap the pollen spores. Use a HEPA room air filter to remove pollen form the indoor air you breathe.  Control of Dog or Cat Allergen Avoidance is the best way to manage a dog or cat allergy. If you have a dog or cat and are allergic to dog or cats, consider removing the dog or cat from the home. If you have a dog or cat but don't want to find it a new home, or if your family wants a pet even though someone in the household is allergic, here are some strategies that may help keep symptoms at bay:  Keep the pet out of your bedroom and restrict it to only a few rooms. Be advised that keeping the dog or cat in only one room will not limit the allergens to that room. Don't pet, hug or kiss the dog or cat; if you do, wash your hands with soap and water . High-efficiency particulate air (HEPA) cleaners run continuously in a bedroom or living room can reduce allergen levels over time. Regular use of a high-efficiency vacuum cleaner or a central vacuum can reduce allergen levels. Giving your dog or cat a bath at least once a week can reduce airborne allergen.

## 2024-01-13 ENCOUNTER — Encounter: Payer: Self-pay | Admitting: Family Medicine

## 2024-01-13 ENCOUNTER — Other Ambulatory Visit: Payer: Self-pay

## 2024-01-13 ENCOUNTER — Ambulatory Visit (INDEPENDENT_AMBULATORY_CARE_PROVIDER_SITE_OTHER): Admitting: Family Medicine

## 2024-01-13 VITALS — BP 90/60 | HR 79 | Temp 98.3°F | Resp 16 | Ht 69.0 in | Wt 212.8 lb

## 2024-01-13 DIAGNOSIS — J302 Other seasonal allergic rhinitis: Secondary | ICD-10-CM

## 2024-01-13 DIAGNOSIS — H1013 Acute atopic conjunctivitis, bilateral: Secondary | ICD-10-CM | POA: Diagnosis not present

## 2024-01-13 DIAGNOSIS — J4541 Moderate persistent asthma with (acute) exacerbation: Secondary | ICD-10-CM | POA: Diagnosis not present

## 2024-01-13 DIAGNOSIS — J3089 Other allergic rhinitis: Secondary | ICD-10-CM | POA: Diagnosis not present

## 2024-01-13 MED ORDER — ALBUTEROL SULFATE (2.5 MG/3ML) 0.083% IN NEBU: 2.5000 mg | INHALATION_SOLUTION | RESPIRATORY_TRACT | 1 refills | Status: DC | PRN

## 2024-01-13 MED ORDER — SYMBICORT 160-4.5 MCG/ACT IN AERO: 2.0000 | INHALATION_SPRAY | Freq: Two times a day (BID) | RESPIRATORY_TRACT | 2 refills | Status: DC

## 2024-01-13 MED ORDER — BUDESONIDE-FORMOTEROL FUMARATE 160-4.5 MCG/ACT IN AERO: 2.0000 | INHALATION_SPRAY | Freq: Two times a day (BID) | RESPIRATORY_TRACT | 5 refills | Status: DC

## 2024-01-13 MED ORDER — PREDNISONE 10 MG PO TABS: ORAL_TABLET | ORAL | 0 refills | Status: AC

## 2024-01-13 MED ORDER — ALBUTEROL SULFATE HFA 108 (90 BASE) MCG/ACT IN AERS: 2.0000 | INHALATION_SPRAY | Freq: Four times a day (QID) | RESPIRATORY_TRACT | 1 refills | Status: DC | PRN

## 2024-01-13 MED ORDER — LEVOCETIRIZINE DIHYDROCHLORIDE 5 MG PO TABS: 5.0000 mg | ORAL_TABLET | Freq: Every day | ORAL | 5 refills | Status: DC | PRN

## 2024-01-18 ENCOUNTER — Telehealth: Payer: Self-pay

## 2024-01-18 ENCOUNTER — Other Ambulatory Visit (HOSPITAL_COMMUNITY): Payer: Self-pay

## 2024-01-18 NOTE — Telephone Encounter (Signed)
*  AA  Pharmacy Patient Advocate Encounter   Received notification from CoverMyMeds that prior authorization for Airsupra  90-80MCG/ACT aerosol  is required/requested.   Insurance verification completed.   The patient is insured through [facility] [employer] .   Per test claim: PA required; PA submitted to above mentioned insurance via CoverMyMeds Key/confirmation #/EOC [id number] Status is pending

## 2024-01-19 NOTE — Telephone Encounter (Signed)
 No documentation of failure to Dulera - this only shows as being administered due to a hospital admission.

## 2024-01-19 NOTE — Telephone Encounter (Signed)
 Denied. Per the health plan's preferred drug list, at least 2 preferred drugs must be tried before requesting this drug or tell us  why the member cannot try any preferred alternatives. Please send us  supporting chart notes and lab results. Here is list of preferred alternatives: Diskus, Advair HFA Inhaler, Dulera  Inhaler, Symbicort  Inhaler. Per our records, the member has already tried Symbicort  Inhaler. Note: Some preferred drug(s) may have quantity limits. Refer to the health plan's preferred drug list for additional details.

## 2024-01-25 NOTE — Telephone Encounter (Signed)
 Patient called back to go over previous note and did not understand why the airsupra  was not covered. I explained several times she had to try and fail inhalers that are not considered rescue inhalers.  Patient has not been using the Symbicort  and albuterol  inhaler correctly and stated she has stopped using the Symbicort  inhaler and has needed the albuterol  more, which has not helped. To justify why she needs the airsupra  which is not covered by medicaid.    - patient may need to be seen earlier than 03/15/24 by Dr. Jeneal for asthma evaluation please advise.

## 2024-01-25 NOTE — Telephone Encounter (Signed)
 I called the patient. Was unable to speak with her, I left a voicemail for a return call.

## 2024-01-26 NOTE — Telephone Encounter (Signed)
 Spoke with Andrea and I explained Dr Padgett's note and she says she will follow this regime.

## 2024-03-15 ENCOUNTER — Encounter: Payer: Self-pay | Admitting: Allergy

## 2024-03-15 ENCOUNTER — Other Ambulatory Visit: Payer: Self-pay

## 2024-03-15 ENCOUNTER — Encounter: Admitting: Allergy

## 2024-03-16 NOTE — Progress Notes (Signed)
 This encounter was created in error - please disregard.

## 2024-04-12 ENCOUNTER — Ambulatory Visit (INDEPENDENT_AMBULATORY_CARE_PROVIDER_SITE_OTHER): Admitting: Allergy

## 2024-04-12 ENCOUNTER — Encounter: Payer: Self-pay | Admitting: Allergy

## 2024-04-12 ENCOUNTER — Other Ambulatory Visit: Payer: Self-pay

## 2024-04-12 VITALS — BP 128/70 | HR 82 | Temp 97.3°F | Resp 19

## 2024-04-12 DIAGNOSIS — J302 Other seasonal allergic rhinitis: Secondary | ICD-10-CM

## 2024-04-12 DIAGNOSIS — H1013 Acute atopic conjunctivitis, bilateral: Secondary | ICD-10-CM

## 2024-04-12 DIAGNOSIS — J4540 Moderate persistent asthma, uncomplicated: Secondary | ICD-10-CM

## 2024-04-12 DIAGNOSIS — J3089 Other allergic rhinitis: Secondary | ICD-10-CM

## 2024-04-12 MED ORDER — ALBUTEROL SULFATE HFA 108 (90 BASE) MCG/ACT IN AERS: 2.0000 | INHALATION_SPRAY | Freq: Four times a day (QID) | RESPIRATORY_TRACT | 1 refills | Status: DC | PRN

## 2024-04-12 MED ORDER — CROMOLYN SODIUM 4 % OP SOLN: 2.0000 [drp] | Freq: Four times a day (QID) | OPHTHALMIC | 2 refills | Status: AC

## 2024-04-12 MED ORDER — LEVOCETIRIZINE DIHYDROCHLORIDE 5 MG PO TABS: 5.0000 mg | ORAL_TABLET | Freq: Every day | ORAL | 5 refills | Status: AC | PRN

## 2024-04-12 MED ORDER — BUDESONIDE-FORMOTEROL FUMARATE 160-4.5 MCG/ACT IN AERO: 2.0000 | INHALATION_SPRAY | Freq: Two times a day (BID) | RESPIRATORY_TRACT | 5 refills | Status: AC

## 2024-04-12 MED ORDER — AZELASTINE-FLUTICASONE 137-50 MCG/ACT NA SUSP: 1.0000 | Freq: Two times a day (BID) | NASAL | 5 refills | Status: AC | PRN

## 2024-04-12 NOTE — Addendum Note (Signed)
 Addended by: Dallan Schonberg P on: 04/12/2024 02:18 PM   Modules accepted: Orders

## 2024-04-12 NOTE — Progress Notes (Signed)
 Follow-up Note  RE: Anita Villarreal MRN: [id number] DOB: 1971/02/03 Date of Office Visit: 04/12/2024   History of present illness: Anita Villarreal is a 53 y.o. female presenting today for follow-up of asthma, allergic rhinitis conjunctivitis.  She was last seen in the office on 01/13/2024 by our nurse practitioner Ambs. Discussed the use of AI scribe software for clinical note transcription with the patient, who gave verbal consent to proceed.  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 She has had this procedure before as she reports the nerves tend to regenerate over time.  Regarding her asthma, she experienced a flare-up in July which prompted her last visit for which she completed a course of prednisone . Since then, she has not had any significant breathing issues. She manages her asthma by carrying both her Symbicort  and albuterol  inhalers.  She notes that her triggers can be unpredictability of the weather and seasonal changes. She ensures to have extra inhalers when traveling.  She also states she has albuterol  inhalers at family's homes in case she were to forget hers which does not typically occur.  Her allergic rhinitis has been challenging this year due to unusual pollen patterns, with tree, grass, and weed pollens present simultaneously. She is allergic to tree pollen, which is still present unusually late in the year. She takes Xyzal  for allergies, which she finds helpful, and uses Dymista  nasal spray, although there have been issues with pharmacy supply. She also uses eye drops for itchy, watery eyes.     Review of systems: 10pt ROS negative unless noted above in HPI  Past medical/social/surgical/family history have been reviewed and are unchanged unless specifically indicated below.  See HPI  Medication List: Current Outpatient Medications  Medication Sig Dispense  Refill   AJOVY 225 MG/1.5ML SOAJ Inject into the skin.     albuterol  (PROVENTIL ) (2.5 MG/3ML) 0.083% nebulizer solution Take 3 mLs (2.5 mg total) by nebulization every 4 (four) hours as needed for wheezing or shortness of breath. 75 mL 1   albuterol  (VENTOLIN  HFA) 108 (90 Base) MCG/ACT inhaler Inhale 2 puffs into the lungs every 6 (six) hours as needed for wheezing or shortness of breath. 18 g 1   Azelastine -Fluticasone  (DYMISTA ) 137-50 MCG/ACT SUSP 1 spray in each nostril twice a day as needed 23 g 5   budesonide -formoterol  (SYMBICORT ) 160-4.5 MCG/ACT inhaler Inhale 2 puffs into the lungs 2 (two) times daily. 10.2 g 5   cholecalciferol (VITAMIN D3) 25 MCG (1000 UNIT) tablet Take 1,000 Units by mouth daily.     cromolyn  (OPTICROM ) 4 % ophthalmic solution Place 2 drops into both eyes 4 (four) times daily. 10 mL 2   dicyclomine  (BENTYL ) 20 MG tablet Take 20 mg by mouth 4 (four) times daily.     lamoTRIgine (LAMICTAL) 100 MG tablet Take 100 mg by mouth daily.      levocetirizine (XYZAL ) 5 MG tablet Take 1 tablet (5 mg total) by mouth daily as needed for allergies. 60 tablet 5   lithium  carbonate 300 MG capsule Take 600 mg by mouth 2 (two) times daily.      loperamide (IMODIUM) 2 MG capsule Take 2 mg by mouth as needed.     lurasidone (LATUDA) 40 MG TABS tablet Take 40 mg by mouth daily with breakfast.     omeprazole (PRILOSEC) 40 MG capsule Take 40 mg by mouth daily.  ondansetron  (ZOFRAN -ODT) 4 MG disintegrating tablet Take 4 mg by mouth every 8 (eight) hours as needed for refractory nausea / vomiting.     Spacer/Aero-Holding Raguel FRENCH Use as directed with metered dose inhaler. 1 each 1   SYMBICORT  160-4.5 MCG/ACT inhaler Inhale 2 puffs into the lungs in the morning and at bedtime. 10.2 g 2   topiramate (TOPAMAX) 50 MG tablet Take 50 mg by mouth 2 (two) times daily.      zolpidem  (AMBIEN ) 10 MG tablet Take 10 mg by mouth at bedtime.     cephALEXin  (KEFLEX ) 500 MG capsule Take 1 capsule (500  mg total) by mouth 4 (four) times daily. (Patient not taking: Reported on 04/12/2024) 20 capsule 0   cycloSPORINE (RESTASIS) 0.05 % ophthalmic emulsion Place 1 drop into both eyes 2 (two) times daily. (Patient not taking: Reported on 04/12/2024)     diazepam (VALIUM) 5 MG tablet Take 5 mg by mouth every 6 (six) hours as needed for anxiety. (Patient not taking: Reported on 04/12/2024)     diphenhydrAMINE  (BENADRYL ) 25 mg capsule Take 25 mg by mouth every 6 (six) hours as needed for allergies. (Patient not taking: Reported on 04/12/2024)     guaiFENesin  (MUCINEX ) 600 MG 12 hr tablet Take 1 tablet (600 mg total) by mouth 2 (two) times daily. (Patient not taking: Reported on 04/12/2024) 30 tablet 0   hydrOXYzine  (ATARAX ) 50 MG tablet Take 50 mg by mouth 3 (three) times daily as needed for anxiety. (Patient not taking: Reported on 04/12/2024)     hydrOXYzine  (VISTARIL ) 100 MG capsule Take 200 mg by mouth at bedtime. (Patient not taking: Reported on 04/12/2024)     LAGEVRIO 200 MG CAPS capsule Take 4 capsules by mouth 2 (two) times daily. (Patient not taking: Reported on 04/12/2024)     metFORMIN (GLUCOPHAGE) 1000 MG tablet Take 1,000 mg by mouth 2 (two) times daily. (Patient not taking: Reported on 04/12/2024)     methocarbamol  (ROBAXIN ) 500 MG tablet Take 500 mg by mouth 3 (three) times daily. (Patient not taking: Reported on 04/12/2024)     metoCLOPramide  (REGLAN ) 5 MG tablet Take 5 mg by mouth every 6 (six) hours as needed for refractory nausea / vomiting. (Patient not taking: Reported on 04/12/2024)     nicotine  (NICODERM CQ  - DOSED IN MG/24 HOURS) 14 mg/24hr patch Place 14 mg onto the skin daily. (Patient not taking: Reported on 04/12/2024)     oxyCODONE -acetaminophen  (PERCOCET/ROXICET) 5-325 MG tablet Take 1 tablet by mouth every 6 (six) hours as needed for severe pain (pain score 7-10). (Patient not taking: Reported on 04/12/2024) 10 tablet 0   pantoprazole  (PROTONIX ) 40 MG tablet Take 1 tablet (40 mg total) by  mouth daily. (Patient not taking: Reported on 04/12/2024) 30 tablet 1   polyethylene glycol (MIRALAX  / GLYCOLAX ) packet Take 17 g by mouth daily as needed. (Patient not taking: Reported on 04/12/2024) 14 each 0   prazosin (MINIPRESS) 5 MG capsule Take 5 mg by mouth at bedtime. (Patient not taking: Reported on 04/12/2024)     predniSONE  (DELTASONE ) 10 MG tablet Prednisone  10 mg tablets. Take 2 tablets once a day for 4 days, then take 1 tablet on the 5th day, then stop. (Patient not taking: Reported on 04/12/2024) 9 tablet 0   promethazine  (PHENERGAN ) 25 MG tablet Take 25-50 mg by mouth See admin instructions. Along with each dose of Imitrex  as needed for nausea accompanying migraines (Patient not taking: Reported on 04/12/2024)     SUMAtriptan  (  IMITREX ) 100 MG tablet Take 100 mg by mouth See admin instructions. Once as needed for migraine (may repeat once in 2 hours if no relief) (Patient not taking: Reported on 04/12/2024)     tiZANidine (ZANAFLEX) 2 MG tablet Take 2 mg by mouth every 8 (eight) hours as needed for muscle spasms. (Patient not taking: Reported on 04/12/2024)     traZODone (DESYREL) 50 MG tablet Take 100 mg by mouth at bedtime.  (Patient not taking: Reported on 04/12/2024)     VENTOLIN  HFA 108 (90 Base) MCG/ACT inhaler Inhale 2 puffs into the lungs every 6 (six) hours as needed for wheezing or shortness of breath. (Patient not taking: Reported on 04/12/2024) 18 g 1   ziprasidone  (GEODON ) 80 MG capsule Take 160 mg by mouth at bedtime.  (Patient not taking: Reported on 04/12/2024)     No current facility-administered medications for this visit.     Known medication allergies: Allergies  Allergen Reactions   Doxepin Itching   Erythromycin Swelling and Other (See Comments)    Tongue swelling    Nsaids Other (See Comments)    BLEEDING (internally)   Pantoprazole  Itching and Rash    Itchy eyes and throat   Sulfa Antibiotics Swelling, Other (See Comments) and Rash    Swelling of the  tongue swelling     Physical examination: Blood pressure 128/70, pulse 82, temperature (!) 97.3 F (36.3 C), resp. rate 19, last menstrual period 04/04/2015, SpO2 97%.  General: Alert, interactive, in no acute distress. HEENT: PERRLA, TMs pearly gray, turbinates minimally edematous without discharge, post-pharynx non erythematous. Neck: Supple without lymphadenopathy. Lungs: Clear to auscultation without wheezing, rhonchi or rales. {no increased work of breathing. CV: Normal S1, S2 without murmurs. Abdomen: Nondistended, nontender. Skin: Warm and dry, without lesions or rashes. Extremities:  No clubbing, cyanosis or edema. Neuro:   Grossly intact.  Diagnostics/Labs:  Spirometry: FEV1: 2.16L 81%, FVC: 2.67L 79%, ratio consistent with nonobstructive pattern  Assessment and plan:   Asthma Use Symbicort  160mcg 2 puffs twice a day with a spacer to prevent cough or wheeze Continue albuterol  2 puffs every 4 hours as needed for cough or wheeze OR Instead use albuterol  0.083% solution via nebulizer one unit vial every 4 hours as needed for cough or wheeze Asthma control goals:  Full participation in all desired activities (may need albuterol  before activity) Albuterol  use two time or less a week on average (not counting use with activity) Cough interfering with sleep two time or less a month Oral steroids no more than once a year No hospitalizations  Allergic rhinitis Continue allergen avoidance measures directed toward tree pollen, dog, and cat Continue Xyzal  5 mg once a day if needed for runny nose or itch Use Dymista  1 spray each nostril twice a day as needed for runny or stuffy nose.  Consider saline nasal rinses as needed for nasal symptoms. Use this before any medicated nasal sprays for best result Consider allergen immunotherapy if your symptoms are not well-controlled with the treatment plan as listed above.    Allergic conjunctivitis Use Cromolyn  1 drop each eye up to 4  times a day as needed for itchy/watery eyes.    Follow up in 6 months or sooner if needed. I appreciate the opportunity to take part in Anita Villarreal's care. Please do not hesitate to contact me with questions.  Sincerely,   Danita Brain, MD Allergy/Immunology [facility]

## 2024-04-12 NOTE — Patient Instructions (Addendum)
 Asthma Use Symbicort  160mcg 2 puffs twice a day with a spacer to prevent cough or wheeze Continue albuterol  2 puffs every 4 hours as needed for cough or wheeze OR Instead use albuterol  0.083% solution via nebulizer one unit vial every 4 hours as needed for cough or wheeze Asthma control goals:  Full participation in all desired activities (may need albuterol  before activity) Albuterol  use two time or less a week on average (not counting use with activity) Cough interfering with sleep two time or less a month Oral steroids no more than once a year No hospitalizations  Allergic rhinitis Continue allergen avoidance measures directed toward tree pollen, dog, and cat as listed below Continue Xyzal  5 mg once a day if needed for runny nose or itch Use Dymista  1 spray each nostril twice a day as needed for runny or stuffy nose.  Consider saline nasal rinses as needed for nasal symptoms. Use this before any medicated nasal sprays for best result Consider allergen immunotherapy if your symptoms are not well-controlled with the treatment plan as listed above.    Allergic conjunctivitis Use Cromolyn  1 drop each eye up to 4 times a day as needed for itchy/watery eyes.    Follow up in 6 months or sooner if needed.  Reducing Pollen Exposure The [employer] suggests the following steps to reduce your exposure to pollen during allergy seasons. Do not hang sheets or clothing out to dry; pollen may collect on these items. Do not mow lawns or spend time around freshly cut grass; mowing stirs up pollen. Keep windows closed at night.  Keep car windows closed while driving. Minimize morning activities outdoors, a time when pollen counts are usually at their highest. Stay indoors as much as possible when pollen counts or humidity is high and on windy days when pollen tends to remain in the air longer. Use air conditioning when possible.  Many air conditioners have filters  that trap the pollen spores. Use a HEPA room air filter to remove pollen form the indoor air you breathe.  Control of Dog or Cat Allergen Avoidance is the best way to manage a dog or cat allergy. If you have a dog or cat and are allergic to dog or cats, consider removing the dog or cat from the home. If you have a dog or cat but don't want to find it a new home, or if your family wants a pet even though someone in the household is allergic, here are some strategies that may help keep symptoms at bay:  Keep the pet out of your bedroom and restrict it to only a few rooms. Be advised that keeping the dog or cat in only one room will not limit the allergens to that room. Don't pet, hug or kiss the dog or cat; if you do, wash your hands with soap and water . High-efficiency particulate air (HEPA) cleaners run continuously in a bedroom or living room can reduce allergen levels over time. Regular use of a high-efficiency vacuum cleaner or a central vacuum can reduce allergen levels. Giving your dog or cat a bath at least once a week can reduce airborne allergen.

## 2024-04-13 ENCOUNTER — Other Ambulatory Visit (HOSPITAL_COMMUNITY): Payer: Self-pay

## 2024-04-13 ENCOUNTER — Telehealth: Payer: Self-pay

## 2024-04-13 NOTE — Telephone Encounter (Signed)
*  [facility]  Pharmacy Patient Advocate Encounter   Received notification from CoverMyMeds that prior authorization for Azelastine -Fluticasone  137-50MCG/ACT suspension  is required/requested.   Insurance verification completed.   The patient is insured through [facility].   Per test claim: The current 30 day co-pay is, $4.00.  No PA needed at this time. This test claim was processed through [facility]- copay amounts may vary at other pharmacies due to pharmacy/plan contracts, or as the patient moves through the different stages of their insurance plan.     Brand Dymista  preferred and covered

## 2024-04-14 ENCOUNTER — Telehealth: Payer: Self-pay

## 2024-04-14 NOTE — Telephone Encounter (Signed)
 Received on base communication from [place] in [place] phone [phone] fax [phone] regarding pt's breyna  160/4.43mcg oral inh (120inh) I puff by mouth twice a day not covered. Please change to preferred alternative SYMBICORT , ADVAIR HFA, OR ADVAIR DISKUS. Please advise

## 2024-04-19 MED ORDER — SYMBICORT 160-4.5 MCG/ACT IN AERO: 2.0000 | INHALATION_SPRAY | Freq: Two times a day (BID) | RESPIRATORY_TRACT | 2 refills | Status: AC

## 2024-04-19 NOTE — Telephone Encounter (Signed)
Brand has been sent.

## 2024-04-19 NOTE — Addendum Note (Signed)
 Addended by: ONEITA CHRISTIANS D on: 04/19/2024 04:55 PM   Modules accepted: Orders

## 2024-05-15 MED ORDER — AZELASTINE HCL 0.1 % NA SOLN: 2.0000 | Freq: Two times a day (BID) | NASAL | 5 refills | Status: AC

## 2024-05-15 MED ORDER — FLUTICASONE PROPIONATE 50 MCG/ACT NA SUSP: 2.0000 | Freq: Every day | NASAL | 5 refills | Status: AC

## 2024-05-15 NOTE — Telephone Encounter (Signed)
 I called the patient and informed of nasal sprays being sent in.

## 2024-05-15 NOTE — Addendum Note (Signed)
 Addended by: MENDEZ-MUNGARAY, Cerita Rabelo M on: 05/15/2024 03:02 PM   Modules accepted: Orders

## 2024-07-22 ENCOUNTER — Other Ambulatory Visit: Payer: Self-pay | Admitting: Family Medicine

## 2024-08-09 ENCOUNTER — Other Ambulatory Visit: Payer: Self-pay | Admitting: Allergy

## 2024-10-13 ENCOUNTER — Ambulatory Visit: Admitting: Allergy
